# Patient Record
Sex: Male | Born: 1970 | Race: White | Hispanic: No | Marital: Married | Smoking: Former smoker
Health system: Southern US, Community
[De-identification: ages and names within clinical notes are randomized; demographics above are authoritative.]

## PROBLEM LIST (undated history)

## (undated) DIAGNOSIS — G4733 Obstructive sleep apnea (adult) (pediatric): Secondary | ICD-10-CM

## (undated) DIAGNOSIS — G4730 Sleep apnea, unspecified: Secondary | ICD-10-CM

## (undated) DIAGNOSIS — D696 Thrombocytopenia, unspecified: Secondary | ICD-10-CM

## (undated) DIAGNOSIS — M1990 Unspecified osteoarthritis, unspecified site: Secondary | ICD-10-CM

## (undated) DIAGNOSIS — G40909 Epilepsy, unspecified, not intractable, without status epilepticus: Secondary | ICD-10-CM

## (undated) DIAGNOSIS — S069XAA Unspecified intracranial injury with loss of consciousness status unknown, initial encounter: Secondary | ICD-10-CM

## (undated) DIAGNOSIS — S069X9A Unspecified intracranial injury with loss of consciousness of unspecified duration, initial encounter: Secondary | ICD-10-CM

## (undated) DIAGNOSIS — T7840XA Allergy, unspecified, initial encounter: Secondary | ICD-10-CM

## (undated) HISTORY — PX: CHOLECYSTECTOMY: SHX55

## (undated) HISTORY — PX: KNEE SURGERY: SHX244

## (undated) HISTORY — DX: Allergy, unspecified, initial encounter: T78.40XA

## (undated) HISTORY — PX: OTHER SURGICAL HISTORY: SHX169

## (undated) HISTORY — PX: SPINE SURGERY: SHX786

## (undated) HISTORY — PX: BACK SURGERY: SHX140

## (undated) HISTORY — PX: FRACTURE SURGERY: SHX138

## (undated) HISTORY — DX: Unspecified osteoarthritis, unspecified site: M19.90

## (undated) HISTORY — PX: BRAIN SURGERY: SHX531

## (undated) HISTORY — DX: Sleep apnea, unspecified: G47.30

---

## 2010-10-29 DIAGNOSIS — R7989 Other specified abnormal findings of blood chemistry: Secondary | ICD-10-CM | POA: Insufficient documentation

## 2010-10-29 DIAGNOSIS — E039 Hypothyroidism, unspecified: Secondary | ICD-10-CM

## 2010-10-29 HISTORY — DX: Hypothyroidism, unspecified: E03.9

## 2019-11-18 ENCOUNTER — Emergency Department
Admission: EM | Admit: 2019-11-18 | Discharge: 2019-11-18 | Disposition: A | Discharge status: Home / Self Care | Payer: Medicaid Other | Attending: Emergency Medicine | Admitting: Emergency Medicine

## 2019-11-18 ENCOUNTER — Other Ambulatory Visit: Payer: Self-pay

## 2019-11-18 ENCOUNTER — Emergency Department: Payer: Medicaid Other

## 2019-11-18 DIAGNOSIS — R569 Unspecified convulsions: Secondary | ICD-10-CM | POA: Diagnosis present

## 2019-11-18 HISTORY — DX: Epilepsy, unspecified, not intractable, without status epilepticus: G40.909

## 2019-11-18 LAB — CBC WITH DIFFERENTIAL/PLATELET
Abs Immature Granulocytes: 0.03 10*3/uL (ref 0.00–0.07)
Basophils Absolute: 0 10*3/uL (ref 0.0–0.1)
Basophils Relative: 0 %
Eosinophils Absolute: 0.2 10*3/uL (ref 0.0–0.5)
Eosinophils Relative: 2 %
HCT: 41 % (ref 39.0–52.0)
Hemoglobin: 13.8 g/dL (ref 13.0–17.0)
Immature Granulocytes: 0 %
Lymphocytes Relative: 29 %
Lymphs Abs: 2 10*3/uL (ref 0.7–4.0)
MCH: 29.4 pg (ref 26.0–34.0)
MCHC: 33.7 g/dL (ref 30.0–36.0)
MCV: 87.4 fL (ref 80.0–100.0)
Monocytes Absolute: 0.6 10*3/uL (ref 0.1–1.0)
Monocytes Relative: 8 %
Neutro Abs: 4.3 10*3/uL (ref 1.7–7.7)
Neutrophils Relative %: 61 %
Platelets: 117 10*3/uL — ABNORMAL LOW (ref 150–400)
RBC: 4.69 MIL/uL (ref 4.22–5.81)
RDW: 14 % (ref 11.5–15.5)
WBC: 7.1 10*3/uL (ref 4.0–10.5)
nRBC: 0 % (ref 0.0–0.2)

## 2019-11-18 LAB — BASIC METABOLIC PANEL
Anion gap: 9 (ref 5–15)
BUN: 14 mg/dL (ref 6–20)
CO2: 21 mmol/L — ABNORMAL LOW (ref 22–32)
Calcium: 8.4 mg/dL — ABNORMAL LOW (ref 8.9–10.3)
Chloride: 106 mmol/L (ref 98–111)
Creatinine, Ser: 0.92 mg/dL (ref 0.61–1.24)
GFR, Estimated: >60 mL/min (ref >60)
Glucose, Bld: 180 mg/dL — ABNORMAL HIGH (ref 70–99)
Potassium: 3.6 mmol/L (ref 3.5–5.1)
Sodium: 136 mmol/L (ref 135–145)

## 2019-11-18 LAB — ETHANOL: Alcohol, Ethyl (B): <10 mg/dL (ref <10)

## 2019-11-18 NOTE — Discharge Instructions (Addendum)
Please do not drive, go up on roofs/ladders, go in pools or perform any activity that could be dangerous to yourself or others until cleared by neurology. Please seek medical attention for any high fevers, chest pain, shortness of breath, change in behavior, persistent vomiting, bloody stool or any other new or concerning symptoms.

## 2019-11-18 NOTE — ED Notes (Addendum)
Patient transported to X-ray 

## 2019-11-18 NOTE — ED Notes (Signed)
Pt woke from sleep and stating "I need to get up my ass and back are hurting. I can't sit right here anymore." Penni Bombard RN explained for safety reasons we cannot assist patient in standing due to him having seizures earlier today. RN offered to ask doc for pain meds due to pt being uncomfortable in bed. Pt states "Well here's the deal, you've got about 5 minutes and then I AM getting out of this bed". MD Derrill Kay to bedside. Explains to pt again safety concerns, but pt again states he is ready to get up. Pt A&Ox4.

## 2019-11-18 NOTE — ED Triage Notes (Signed)
Pt coming from home via [facility] with seizure. Family states pt has seizure disorder from multiple concussions in the past and pt has had three seizures today at home. Family states pt recently has had a change in dosage in topramax. Pt had fourth seizure once en route with EMS. 5 of midalozam administered IV. Pt also complaining of right shoulder pain after pt was stuck between toilet and wall following third seizure. Pt lethargic and postictal at this time.

## 2019-11-18 NOTE — ED Provider Notes (Signed)
[facility] Emergency Department Provider Note  ____________________________________________   I have reviewed the triage vital signs and the nursing notes.   HISTORY  Chief Complaint Seizures   History limited by: Not Limited   HPI Randy Cordova. is a 49 y.o. male who presents to the emergency department today after a seizure.  The patient states that he has a history of seizures.  He had been getting his care down at [facility] in [place] but states he is recently moved to this area.  He is arranging to get care with Dr. Margaretmary Eddy group and has an appointment with him in the upcoming weeks.  Today however the patient states that he was getting overly stressed secondary to family issues with his mother.  He says he could feel his seizure coming on.  He then had a seizure in the morning.  Later in the day he had another seizure.  Apparently he was somewhat pinned in the bathroom and he is complaining of some residual right-sided pain.  Patient denies any recent illness.  Denies any recent alcohol use.  States that he does not sleep very well at night.   Records reviewed. Per medical record review patient has a history of epilepsy  Past Medical History:  Diagnosis Date  . Epilepsy (HCC)     There are no problems to display for this patient.   Prior to Admission medications   Not on File    Allergies Patient has no known allergies.  No family history on file.  Social History Social History   Tobacco Use  . Smoking status: Never Smoker  . Smokeless tobacco: Never Used  Vaping Use  . Vaping Use: Every day  Substance Use Topics  . Alcohol use: Yes    Comment: rarely  . Drug use: Never    Review of Systems Constitutional: No fever/chills Eyes: No visual changes. ENT: No sore throat. Cardiovascular: Denies chest pain. Respiratory: Denies shortness of breath. Gastrointestinal: No abdominal pain.  No nausea, no vomiting.  No diarrhea.    Genitourinary: Negative for dysuria. Musculoskeletal: Positive for right shoulder pain.  Skin: Negative for rash. Neurological: Positive for seizure.  ____________________________________________   PHYSICAL EXAM:  VITAL SIGNS: ED Triage Vitals  Enc Vitals Group     BP 11/18/19 2115 (!) 144/81     Pulse Rate 11/18/19 2115 96     Resp 11/18/19 2115 (!) 21     Temp 11/18/19 2115 97.8 F (36.6 C)     Temp Source 11/18/19 2115 Oral     SpO2 11/18/19 2115 97 %     Weight 11/18/19 2118 (!) 410 lb (186 kg)     Height 11/18/19 2118 5\' 11"  (1.803 m)     Head Circumference --      Peak Flow --      Pain Score 11/18/19 2118 7   Constitutional: Alert and oriented.  Eyes: Conjunctivae are normal.  ENT      Head: Normocephalic and atraumatic.      Nose: No congestion/rhinnorhea.      Mouth/Throat: Mucous membranes are moist.      Neck: No stridor. Hematological/Lymphatic/Immunilogical: No cervical lymphadenopathy. Cardiovascular: Normal rate, regular rhythm.  No murmurs, rubs, or gallops.  Respiratory: Normal respiratory effort without tachypnea nor retractions. Breath sounds are clear and equal bilaterally. No wheezes/rales/rhonchi. Gastrointestinal: Soft and non tender. No rebound. No guarding.  Genitourinary: Deferred Musculoskeletal: Normal range of motion in all extremities. No lower extremity edema. Neurologic:  Normal speech and language. No gross focal neurologic deficits are appreciated.  Skin:  Skin is warm, dry and intact. No rash noted. Psychiatric: Mood and affect are normal. Speech and behavior are normal. Patient exhibits appropriate insight and judgment.  ____________________________________________    LABS (pertinent positives/negatives)  BMP na 136, k 3.6, glu 180, cr 0.92 CBC wbc 7.1, hgb 13.8, plt 117 Ethanol below threshold ____________________________________________   EKG  I, Phineas Semen, attending physician, personally viewed and interpreted this  EKG  EKG Time: 2107 Rate: 95 Rhythm: normal sinus rhythm Axis: normal Intervals: qtc 454 QRS: narrow ST changes: no st elevation Impression: normal ekg  ____________________________________________    RADIOLOGY  Right shoulder No acute fracture or dislocation  ____________________________________________   PROCEDURES  Procedures  ____________________________________________   INITIAL IMPRESSION / ASSESSMENT AND PLAN / ED COURSE  Pertinent labs & imaging results that were available during my care of the patient were reviewed by me and considered in my medical decision making (see chart for details).   Patient presented to the emergency department today after seizures.  Patient has a known history of seizure disorder.  Recently moved to the area.  Patient states he has been taking his medications as prescribed.  States he was under stress today no family issues.  Work-up here without any concerning electrolyte abnormalities.  No leukocytosis.  Did obtain a right shoulder x-ray given her complaints of pain which did not show fracture or dislocation.  Patient states he is compliant with his medication.  Will observe in the emergency department for complete return to baseline.  If no further seizure activity I do think it be reasonable for patient to be discharged to follow-up with Dr. Sherryll Burger as planned.  ____________________________________________   FINAL CLINICAL IMPRESSION(S) / ED DIAGNOSES  Final diagnoses:  Seizure [facility])     Note: This dictation was prepared with Dragon dictation. Any transcriptional errors that result from this process are unintentional     Phineas Semen, MD 11/18/19 2223

## 2019-11-18 NOTE — ED Notes (Signed)
Pt returned from xray

## 2020-03-19 ENCOUNTER — Other Ambulatory Visit: Payer: Self-pay

## 2020-03-19 ENCOUNTER — Emergency Department: Payer: Medicaid Other

## 2020-03-19 ENCOUNTER — Observation Stay
Admission: EM | Admit: 2020-03-19 | Discharge: 2020-03-20 | Disposition: A | Discharge status: Home / Self Care | Payer: Medicaid Other | Attending: Internal Medicine | Admitting: Internal Medicine

## 2020-03-19 DIAGNOSIS — S80212A Abrasion, left knee, initial encounter: Secondary | ICD-10-CM | POA: Diagnosis not present

## 2020-03-19 DIAGNOSIS — D696 Thrombocytopenia, unspecified: Secondary | ICD-10-CM | POA: Diagnosis not present

## 2020-03-19 DIAGNOSIS — G4733 Obstructive sleep apnea (adult) (pediatric): Secondary | ICD-10-CM | POA: Diagnosis not present

## 2020-03-19 DIAGNOSIS — S8992XA Unspecified injury of left lower leg, initial encounter: Secondary | ICD-10-CM | POA: Diagnosis present

## 2020-03-19 DIAGNOSIS — X58XXXA Exposure to other specified factors, initial encounter: Secondary | ICD-10-CM | POA: Diagnosis not present

## 2020-03-19 DIAGNOSIS — G40909 Epilepsy, unspecified, not intractable, without status epilepticus: Secondary | ICD-10-CM

## 2020-03-19 DIAGNOSIS — M25562 Pain in left knee: Secondary | ICD-10-CM

## 2020-03-19 DIAGNOSIS — Z20822 Contact with and (suspected) exposure to covid-19: Secondary | ICD-10-CM | POA: Diagnosis not present

## 2020-03-19 DIAGNOSIS — G40919 Epilepsy, unspecified, intractable, without status epilepticus: Secondary | ICD-10-CM

## 2020-03-19 DIAGNOSIS — R569 Unspecified convulsions: Secondary | ICD-10-CM | POA: Diagnosis not present

## 2020-03-19 DIAGNOSIS — Z9989 Dependence on other enabling machines and devices: Secondary | ICD-10-CM

## 2020-03-19 HISTORY — DX: Obstructive sleep apnea (adult) (pediatric): G47.33

## 2020-03-19 HISTORY — DX: Thrombocytopenia, unspecified: D69.6

## 2020-03-19 HISTORY — DX: Unspecified intracranial injury with loss of consciousness status unknown, initial encounter: S06.9XAA

## 2020-03-19 HISTORY — DX: Unspecified intracranial injury with loss of consciousness of unspecified duration, initial encounter: S06.9X9A

## 2020-03-19 LAB — URINALYSIS, COMPLETE (UACMP) WITH MICROSCOPIC
Bilirubin Urine: NEGATIVE
Glucose, UA: NEGATIVE mg/dL
Hgb urine dipstick: NEGATIVE
Ketones, ur: NEGATIVE mg/dL
Leukocytes,Ua: NEGATIVE
Nitrite: NEGATIVE
Protein, ur: NEGATIVE mg/dL
Specific Gravity, Urine: 1.024 (ref 1.005–1.030)
Squamous Epithelial / HPF: NONE SEEN (ref 0–5)
pH: 5 (ref 5.0–8.0)

## 2020-03-19 LAB — CBC
HCT: 43.4 % (ref 39.0–52.0)
Hemoglobin: 14.3 g/dL (ref 13.0–17.0)
MCH: 29.4 pg (ref 26.0–34.0)
MCHC: 32.9 g/dL (ref 30.0–36.0)
MCV: 89.1 fL (ref 80.0–100.0)
Platelets: 125 10*3/uL — ABNORMAL LOW (ref 150–400)
RBC: 4.87 MIL/uL (ref 4.22–5.81)
RDW: 14.3 % (ref 11.5–15.5)
WBC: 8.3 10*3/uL (ref 4.0–10.5)
nRBC: 0 % (ref 0.0–0.2)

## 2020-03-19 LAB — URINE DRUG SCREEN, QUALITATIVE (ARMC ONLY)
Amphetamines, Ur Screen: NOT DETECTED
Barbiturates, Ur Screen: NOT DETECTED
Benzodiazepine, Ur Scrn: POSITIVE — AB
Cannabinoid 50 Ng, Ur ~~LOC~~: NOT DETECTED
Cocaine Metabolite,Ur ~~LOC~~: NOT DETECTED
MDMA (Ecstasy)Ur Screen: NOT DETECTED
Methadone Scn, Ur: NOT DETECTED
Opiate, Ur Screen: POSITIVE — AB
Phencyclidine (PCP) Ur S: NOT DETECTED
Tricyclic, Ur Screen: NOT DETECTED

## 2020-03-19 LAB — RESP PANEL BY RT-PCR (FLU A&B, COVID) ARPGX2
Influenza A by PCR: NEGATIVE
Influenza B by PCR: NEGATIVE
SARS Coronavirus 2 by RT PCR: NEGATIVE

## 2020-03-19 LAB — BASIC METABOLIC PANEL
Anion gap: 13 (ref 5–15)
BUN: 15 mg/dL (ref 6–20)
CO2: 16 mmol/L — ABNORMAL LOW (ref 22–32)
Calcium: 8.7 mg/dL — ABNORMAL LOW (ref 8.9–10.3)
Chloride: 109 mmol/L (ref 98–111)
Creatinine, Ser: 1.08 mg/dL (ref 0.61–1.24)
GFR, Estimated: >60 mL/min (ref >60)
Glucose, Bld: 184 mg/dL — ABNORMAL HIGH (ref 70–99)
Potassium: 3.7 mmol/L (ref 3.5–5.1)
Sodium: 138 mmol/L (ref 135–145)

## 2020-03-19 LAB — LACTIC ACID, PLASMA: Lactic Acid, Venous: 1.4 mmol/L (ref 0.5–1.9)

## 2020-03-19 LAB — CK: Total CK: 65 U/L (ref 49–397)

## 2020-03-19 LAB — CBG MONITORING, ED: Glucose-Capillary: 145 mg/dL — ABNORMAL HIGH (ref 70–99)

## 2020-03-19 MED ORDER — TOPIRAMATE 25 MG PO TABS
200.0000 mg | ORAL_TABLET | ORAL | Status: AC
  Administered 2020-03-19: 200 mg via ORAL
  Filled 2020-03-19: qty 8

## 2020-03-19 MED ORDER — ONDANSETRON HCL 4 MG/2ML IJ SOLN: 4.0000 mg | Freq: Three times a day (TID) | INTRAMUSCULAR | Status: DC | PRN

## 2020-03-19 MED ORDER — LEVETIRACETAM IN NACL 1500 MG/100ML IV SOLN
1500.0000 mg | Freq: Two times a day (BID) | INTRAVENOUS | Status: DC
  Administered 2020-03-19: 1500 mg via INTRAVENOUS
  Filled 2020-03-19 (×3): qty 100

## 2020-03-19 MED ORDER — CLOBAZAM 10 MG PO TABS
5.0000 mg | ORAL_TABLET | Freq: Every day | ORAL | Status: DC
Start: 2020-03-20 — End: 2020-03-20

## 2020-03-19 MED ORDER — OXCARBAZEPINE 300 MG PO TABS
900.0000 mg | ORAL_TABLET | ORAL | Status: AC
  Administered 2020-03-19: 900 mg via ORAL
  Filled 2020-03-19: qty 3

## 2020-03-19 MED ORDER — ENOXAPARIN SODIUM 100 MG/ML ~~LOC~~ SOLN
0.5000 mg/kg | SUBCUTANEOUS | Status: DC
  Administered 2020-03-19: 92.5 mg via SUBCUTANEOUS
  Filled 2020-03-19 (×2): qty 1

## 2020-03-19 MED ORDER — ENOXAPARIN SODIUM 40 MG/0.4ML ~~LOC~~ SOLN: 40.0000 mg | SUBCUTANEOUS | Status: DC

## 2020-03-19 MED ORDER — LORAZEPAM 2 MG/ML IJ SOLN: 2.0000 mg | INTRAMUSCULAR | Status: DC | PRN

## 2020-03-19 MED ORDER — OXYCODONE-ACETAMINOPHEN 5-325 MG PO TABS
2.0000 | ORAL_TABLET | ORAL | Status: AC
  Administered 2020-03-19: 2 via ORAL
  Filled 2020-03-19: qty 2

## 2020-03-19 MED ORDER — MORPHINE SULFATE (PF) 4 MG/ML IV SOLN
4.0000 mg | Freq: Once | INTRAVENOUS | Status: AC
  Administered 2020-03-19: 4 mg via INTRAVENOUS
  Filled 2020-03-19: qty 1

## 2020-03-19 MED ORDER — ACETAMINOPHEN 325 MG PO TABS
650.0000 mg | ORAL_TABLET | Freq: Four times a day (QID) | ORAL | Status: DC | PRN
  Administered 2020-03-20: 10:00:00 650 mg via ORAL
  Filled 2020-03-19: qty 2

## 2020-03-19 MED ORDER — CLOBAZAM 10 MG PO TABS
5.0000 mg | ORAL_TABLET | Freq: Once | ORAL | Status: AC
  Administered 2020-03-19: 5 mg via ORAL
  Filled 2020-03-19: qty 1

## 2020-03-19 MED ORDER — LEVETIRACETAM IN NACL 1500 MG/100ML IV SOLN
1500.0000 mg | Freq: Once | INTRAVENOUS | Status: AC
  Administered 2020-03-19: 1500 mg via INTRAVENOUS
  Filled 2020-03-19: qty 100

## 2020-03-19 MED ORDER — OXCARBAZEPINE 300 MG PO TABS
1200.0000 mg | ORAL_TABLET | Freq: Two times a day (BID) | ORAL | Status: DC
  Administered 2020-03-19 – 2020-03-20 (×2): 1200 mg via ORAL
  Filled 2020-03-19 (×3): qty 4

## 2020-03-19 MED ORDER — SODIUM CHLORIDE 0.9 % IV BOLUS
1000.0000 mL | Freq: Once | INTRAVENOUS | Status: AC
  Administered 2020-03-19: 1000 mL via INTRAVENOUS

## 2020-03-19 MED ORDER — OXCARBAZEPINE 300 MG PO TABS
300.0000 mg | ORAL_TABLET | Freq: Once | ORAL | Status: AC
  Administered 2020-03-19: 300 mg via ORAL
  Filled 2020-03-19: qty 1

## 2020-03-19 MED ORDER — TOPIRAMATE 100 MG PO TABS
200.0000 mg | ORAL_TABLET | Freq: Two times a day (BID) | ORAL | Status: DC
  Administered 2020-03-19 – 2020-03-20 (×2): 200 mg via ORAL
  Filled 2020-03-19: qty 8
  Filled 2020-03-19 (×2): qty 2

## 2020-03-19 MED ORDER — KETOROLAC TROMETHAMINE 30 MG/ML IJ SOLN
30.0000 mg | Freq: Once | INTRAMUSCULAR | Status: AC
  Administered 2020-03-19: 30 mg via INTRAVENOUS
  Filled 2020-03-19: qty 1

## 2020-03-19 NOTE — ED Provider Notes (Signed)
[facility] Emergency Department Provider Note ____________________________________________   Event Date/Time   First MD Initiated Contact with Patient 03/19/20 1022     (approximate)  I have reviewed the triage vital signs and the nursing notes.  HISTORY  Chief Complaint Seizures  HPI Randy Cordova. is a 50 y.o. male history of epilepsy  Patient reports that he usually has about 1 seizure per week.  He sees Dr. Malvin Johns of neurology.  Today he is not sure how many seizures he had, but he knows that EMS came to get him.  EMS was able to speak to the patient's roommate who witnessed him have a seizure and that he fell out of bed, shortly thereafter he had another seizure each with which lasted about 30 seconds.   While with EMS he had additional seizure lasting about a minute or less, he received 2 mg of Versed with improvement.  He is now alert and oriented.  He is complaining to EMS noting that his left leg hurts.  Patient reports that he has a long history of seizures he is compliant with his medication but not yet taken his medicines today.  Sees Dr. Malvin Johns.  Right now the only thing hurting right now is his left knee that feels banged up.  No numbness tingling or weakness.  No headache.  No fevers or chills.  Denies recent illness other than having a seizure about 1 week ago which is not unusual  He has a previous history of traumatic brain injury   Past Medical History:  Diagnosis Date  . Epilepsy (HCC)     There are no problems to display for this patient.   History reviewed. No pertinent surgical history.  Prior to Admission medications   Not on File    Allergies Patient has no known allergies.  History reviewed. No pertinent family history.  Social History Social History   Tobacco Use  . Smoking status: Never Smoker  . Smokeless tobacco: Never Used  Vaping Use  . Vaping Use: Every day  Substance Use Topics  . Alcohol use: Yes     Comment: rarely  . Drug use: Never    Review of Systems Constitutional: No fever/chills Eyes: No visual changes. ENT: No sore throat.  Denies neck pain.  Reports a consistent soreness over the back of his head that requires that he have a pillow under his head at all times which he reports is chronic. Cardiovascular: Denies chest pain. Respiratory: Denies shortness of breath. Gastrointestinal: No abdominal pain.   Genitourinary: Negative for dysuria. Musculoskeletal: Negative for back pain.  Pain over the left knee Skin: Negative for rash. Neurological: Negative for headaches, areas of focal weakness or numbness.    ____________________________________________   PHYSICAL EXAM:  VITAL SIGNS: ED Triage Vitals  Enc Vitals Group     BP 03/19/20 1030 138/72     Pulse Rate 03/19/20 1030 (!) 103     Resp 03/19/20 1030 (!) 22     Temp 03/19/20 1030 97.9 F (36.6 C)     Temp Source 03/19/20 1030 Oral     SpO2 03/19/20 1030 95 %     Weight 03/19/20 1024 (!) 410 lb (186 kg)     Height 03/19/20 1024 6' (1.829 m)     Head Circumference --      Peak Flow --      Pain Score 03/19/20 1025 4     Pain Loc --      Pain  Edu? --      Excl. in GC? --     Constitutional: Alert and oriented. Well appearing and in no acute distress.  Patient does have morbid obesity.  He is very pleasant.  Does tell me that he has a history of having a very short temper but he is calm and pleasant Eyes: Conjunctivae are normal. Head: Atraumatic. Nose: No congestion/rhinnorhea. Mouth/Throat: Mucous membranes are moist. Neck: No stridor.  Cardiovascular: Normal rate, regular rhythm. Grossly normal heart sounds.  Good peripheral circulation. Respiratory: Normal respiratory effort.  No retractions. Lungs CTAB. Gastrointestinal: Soft and nontender. No distention. Musculoskeletal: Good use of all extremities except he does have some slight limitation of range of motion of the left knee reports pain through the  range of motion.  There is a small abrasion and slight contusion overlying the left anterior knee joint.  Neurovascularly intact distally with strong distal perfusion. Neurologic:  Normal speech and language. No gross focal neurologic deficits are appreciated.  He exhibits no seizure-like activity at this time.  Equal grip strengths.  Normal facial movements.  Clear speech.  No twitching or tremoring.  Fully alert Skin:  Skin is warm, dry and intact. No rash noted. Psychiatric: Mood and affect are normal. Speech and behavior are normal.  ____________________________________________   LABS (all labs ordered are listed, but only abnormal results are displayed)  Labs Reviewed  RESP PANEL BY RT-PCR (FLU A&B, COVID) [id number]  CBC  BASIC METABOLIC PANEL  LEVETIRACETAM LEVEL  URINALYSIS, COMPLETE (UACMP) WITH MICROSCOPIC  CBG MONITORING, ED   ____________________________________________  EKG  Reviewed inter by me at 1502 Heart rate 95 QRS 100 QTc 450 Normal sinus rhythm no evidence of ischemia ____________________________________________  RADIOLOGY  DG Tibia/Fibula Left  Result Date: 03/19/2020 CLINICAL DATA:  Fall, lower leg pain laterally EXAM: LEFT TIBIA AND FIBULA - 2 VIEW COMPARISON:  Knee radiographs from 03/19/2020 FINDINGS: Considerable tricompartmental osteoarthritis in the knee. No fracture or acute bony findings identified. No foreign body is observed. IMPRESSION: 1. Considerable tricompartmental osteoarthritis in the knee. No tibia/fibular fracture is identified. Electronically Signed   By: Gaylyn RongWalter  Liebkemann M.D.   On: 03/19/2020 13:00   CT Head Wo Contrast  Result Date: 03/19/2020 CLINICAL DATA:  Seizure with fall EXAM: CT HEAD WITHOUT CONTRAST CT CERVICAL SPINE WITHOUT CONTRAST TECHNIQUE: Multidetector CT imaging of the head and cervical spine was performed following the standard protocol without intravenous contrast. Multiplanar CT image reconstructions of the cervical  spine were also generated. COMPARISON:  None. FINDINGS: CT HEAD FINDINGS Brain: Ventricles and sulci are normal in size and configuration. There is no intracranial mass, hemorrhage, extra-axial fluid collection, or midline shift. Brain parenchyma appears unremarkable. No evident acute infarct. Vascular: No hyperdense vessels. No appreciable vascular calcification. Skull: Bony calvarium appears intact. Sinuses/Orbits: There is an apparent retention cyst in the left sphenoid sinus. There is mild mucosal thickening in several ethmoid air cells. Orbits appear symmetric bilaterally. Other: Mastoid air cells are clear. CT CERVICAL SPINE FINDINGS Alignment: There is no appreciable spondylolisthesis. Skull base and vertebrae: The skull base and craniocervical junction regions appear normal. There is no fracture. There are no blastic or lytic bone lesions. Soft tissues and spinal canal: Prevertebral soft tissues and predental space regions are normal. There is no cord or canal hematoma. There are no paraspinous lesions. Disc levels: There is moderate disc space narrowing C6-7 and C7-T1 with milder disc space narrowing at C5-6. 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 Anterior osteophytes are noted at C4, C5, C6, and C7. There is facet hypertrophy at several levels without nerve root edema or effacement. No frank disc extrusion. No stenosis at levels other than the upper cervical region due to the posterior longitudinal ligament ossification. Upper chest: Visualized upper abdominal structures appear normal. Other: None IMPRESSION: CT head: Normal appearing brain parenchyma. No mass or hemorrhage. Areas of paranasal sinus disease noted. CT cervical spine: No fracture or spondylolisthesis. 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 Osteoarthritic change at several other levels without spinal stenosis. No disc extrusion noted. Electronically Signed   By: Bretta Bang III M.D.   On: 03/19/2020 12:17   CT Cervical Spine Wo Contrast  Result Date: 03/19/2020 CLINICAL DATA:  Seizure with fall EXAM: CT HEAD WITHOUT CONTRAST CT CERVICAL SPINE WITHOUT CONTRAST TECHNIQUE: Multidetector CT imaging of the head and cervical spine was performed following the standard protocol without intravenous contrast. Multiplanar CT image reconstructions of the cervical spine were also generated. COMPARISON:  None. FINDINGS: CT HEAD FINDINGS Brain: Ventricles and sulci are normal in size and configuration. There is no intracranial mass, hemorrhage, extra-axial fluid collection, or midline shift. Brain parenchyma appears unremarkable. No evident acute infarct. Vascular: No hyperdense vessels. No appreciable vascular calcification. Skull: Bony calvarium appears intact. Sinuses/Orbits: There is an apparent retention cyst in the left sphenoid sinus. There is mild mucosal thickening in several ethmoid air cells. Orbits appear symmetric bilaterally. Other: Mastoid air cells are clear. CT CERVICAL SPINE FINDINGS Alignment: There is no appreciable spondylolisthesis. Skull base and vertebrae: The skull base and craniocervical junction regions appear normal. There is no fracture. There are no blastic or lytic bone lesions. Soft tissues and spinal canal: Prevertebral soft tissues and predental space regions are normal. There is no cord or canal hematoma. There are no paraspinous lesions. Disc levels: There is moderate disc space narrowing C6-7 and C7-T1 with milder disc space narrowing at C5-6. 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 Anterior osteophytes are noted at C4, C5, C6, and C7. There is facet hypertrophy at several levels without nerve root edema or effacement. No frank disc extrusion. No stenosis at levels other than the upper cervical region due to the posterior longitudinal ligament ossification. Upper chest: Visualized upper abdominal structures appear normal. Other: None IMPRESSION: CT head: Normal appearing brain parenchyma. No mass or hemorrhage. Areas of paranasal sinus disease noted. CT cervical spine: No fracture or spondylolisthesis. 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 Osteoarthritic change at several other levels without spinal stenosis. No disc extrusion noted. Electronically Signed   By: Bretta Bang III M.D.   On: 03/19/2020 12:17   DG Knee Complete 4 Views Left  Result Date: 03/19/2020 CLINICAL DATA:  Left knee pain after seizure with fall. EXAM: LEFT KNEE - COMPLETE 4+ VIEW COMPARISON:  None. FINDINGS: No acute fracture or dislocation. No definite joint effusion, although evaluation is limited due to suboptimal positioning on the lateral view. Moderate medial and patellofemoral compartment joint space narrowing. Mild lateral compartment joint space narrowing. Tricompartmental marginal osteophytes. Bone mineralization is normal.  Soft tissues are unremarkable. IMPRESSION: 1. No acute osseous abnormality. 2. Tricompartmental osteoarthritis. Electronically Signed   By: Obie Dredge M.D.   On: 03/19/2020 11:53     ____________________________________________   PROCEDURES  Procedure(s) performed: None  Procedures  Critical Care performed: No  ____________________________________________   INITIAL IMPRESSION / ASSESSMENT AND PLAN / ED COURSE  Pertinent labs & imaging results that were available during my care of the patient were reviewed by me and considered in my medical  decision making (see chart for details).   Patient presents for apparent 3 seizures today.  Each of which was relatively brief, and these occur and what he reports is compliance with his medications.  I have ordered Keppra IV as well as his oxcarbazepine and Topamax.  I have discussed with neurologist, Dr. Iver Nestle will see him in consultation due to the complexity of his epilepsy history and taking multiple seizure medicines.  He is currently awake and alert without distress, he does complain of pain over the left knee and I have ordered imaging to exclude fracture I suspect soft tissue injury and abrasion possibly some element of contusion or small hematoma over the anterior left knee.  He is able to range it though but with pain, low suspicion for fracture.  We will obtain CT of the head as he has had multiple seizures today and did fall out of bed.  Also include cervical spine he reports pain over the back of the head but reports this to be chronic, but also wished to exclude cervical injury.  Neurologically intact.  We will continue to monitor carefully, seizure precautions.  Denies any acute infectious illness no dysuria, no cough no fevers no recent illness other than a seizure a week ago which he reports is not unusual.  Clinical Course as of 03/19/20 1448  Sun Mar 19, 2020  1032 Consult placed and acknowledged by Dr. Iver Nestle at this time.  [MQ]  1226 Patient resting comfortably, reports still having ongoing knee pain little bit lower than his knee joint at this time.  No obvious injury except some swelling and abrasion over the anterior knee.  No pain in his foot or ankle.  Will obtain imaging to evaluate for possible tib-fib injury though low suspicion.  Patient does report a history of chronic issues with his left knee as well.  Discussed with patient risk benefits of pain medications, will utilize Percocet for pain relief at this time.  He is comfortable alert and oriented no further seizure  activity here.  Understands awaiting neuro consult at this time.  Strong dorsalis pedis posterior tibial pulses left lower extremity.  Normal neuro exam able to dorsi and plantarflex left foot without difficulty [MQ]    Clinical Course User Index [MQ] Sharyn Creamer, MD   ----------------------------------------- 2:51 PM on 03/19/2020 -----------------------------------------  Appreciate consultation from neurology.  Patient will be admitted for observation due to multiple seizures today, is as recommended by neurology.  Admission discussed with Dr. Clyde Lundborg  ____________________________________________   FINAL CLINICAL IMPRESSION(S) / ED DIAGNOSES  Final diagnoses:  Nonintractable epilepsy without status epilepticus, unspecified epilepsy type (HCC)  Acute pain of left knee  Abrasion of left knee, initial encounter        Note:  This document was prepared using Dragon voice recognition software and may include unintentional dictation errors       Sharyn Creamer, MD 03/19/20 1515

## 2020-03-19 NOTE — ED Notes (Addendum)
Patient states that he has "cloudy feeling" that he sometimes gets prior to seizures. Patient just returned to bed from using restroom. Patient's HR and work of breathing both elevated after returning to bed. Patient alert and oriented, speaking in full, clear sentences at this time. Message sent to Clyde Lundborg, MD stating same.

## 2020-03-19 NOTE — ED Triage Notes (Signed)
Pt had one unwitnessed and 2 witnessed seizures this am, each lasting about 30-45 seconds. Pt alert and oriented on arrival. C/o left leg pain

## 2020-03-19 NOTE — ED Notes (Addendum)
Patient to CT at this time. Patient aware of need of urine sample. Urinal a bedside.

## 2020-03-19 NOTE — H&P (Signed)
History and Physical    Randy Cordova. HQR:[id number] DOB: 1970/09/25 DOA: 03/19/2020  Referring MD/NP/PA:   PCP: Pcp, No   Patient coming from:  The patient is coming from home.  At baseline, pt is independent for most of ADL.        Chief Complaint: seizure  HPI: Randy Cordova. is a 50 y.o. male with medical history significant of seizure, thrombocytopenia, traumatic brain injury, OSA on CPAP,who presents with seizure  Patient has history of seizure, has been followed up by neurologist, Dr. Malvin Johns.  He states that he usually has 1 seizure per week. Per EMS report, patient's roommate witnessed patient have a seizure, he fell out of bed. Shortly thereafter he had another seizure. Both episodes lasted for about 30 seconds. While with EMS, pt had additional seizure lasting about a minute or less. He was given 2 mg of Versed with improvement. At arrival to ED, pt is alert and oriented.  He is complaining left leg and knee pain. He has small area of skin abrasion the left knee. No numbness, tingling or weakness.  No headache.  Patient does not have chest pain, shortness breath, cough, fever or chills.  No nausea vomiting, diarrhea, abdominal pain, symptoms of UTI. Pt states that he has long history of seizure and has been compliant with his medication, but not yet taken his medicines today.    ED Course: pt was found to have WBC 8.3, negative Covid PCR, CK level 65, platelet 125 (117 on 12/19/2019), renal function okay, temperature normal, blood pressure 141/75, heart rate 103, 98, RR 22, oxygen saturation 95% on room air.  CT of head is negative for acute intracranial abnormalities.  CT of C-spine is negative for bony fracture, but showed some stenosis.  X-ray of left knee and left tibia/fibula negative for bony fracture.  Patient is placed on MedSurg bed for observation, Dr. Iver Nestle of neurology is consulted  Review of Systems:   General: no fevers, chills, no body weight gain, has  fatigue HEENT: no blurry vision, hearing changes or sore throat Respiratory: no dyspnea, coughing, wheezing CV: no chest pain, no palpitations GI: no nausea, vomiting, abdominal pain, diarrhea, constipation GU: no dysuria, burning on urination, increased urinary frequency, hematuria  Ext: no leg edema Neuro: no unilateral weakness, numbness, or tingling, no vision change or hearing loss. Has seizure Skin: no rash. small area of skin abrasion to left knee MSK: No muscle spasm, no deformity, no limitation of range of movement in spin. Has left leg and knee pain Heme: No easy bruising.  Travel history: No recent long distant travel.  Allergy:  Allergies  Allergen Reactions  . Other Other (See Comments)    Uncoded Allergy. Allergen: ARTIFICIAL SWEETNERS  . Aspartame And Phenylalanine     Headache    Past Medical History:  Diagnosis Date  . Epilepsy (HCC)   . OSA on CPAP   . TBI (traumatic brain injury) (HCC)   . Thrombocytopenia (HCC)     Past Surgical History:  Procedure Laterality Date  . BACK SURGERY    . CHOLECYSTECTOMY    . Head surgery     Due to brain injury  . KNEE SURGERY      Social History:  reports that he has never smoked. He has never used smokeless tobacco. He reports current alcohol use. He reports that he does not use drugs.  Family History:  Family History  Problem Relation Age of Onset  . Arthritis/Rheumatoid Mother  Prior to Admission medications   Not on File    Physical Exam: Vitals:   03/19/20 1230 03/19/20 1300 03/19/20 1503 03/19/20 1510  BP: (!) 142/79 (!) 141/75 (!) 112/54   Pulse: 99 98 95   Resp: 18  16   Temp:      TempSrc:      SpO2: 98% 98% 91% 98%  Weight:      Height:       General: Not in acute distress HEENT:       Eyes: PERRL, EOMI, no scleral icterus.       ENT: No discharge from the ears and nose, no pharynx injection, no tonsillar enlargement.        Neck: No JVD, no bruit, no mass felt. Heme: No neck lymph  node enlargement. Cardiac: S1/S2, RRR, No murmurs, No gallops or rubs. Respiratory: No rales, wheezing, rhonchi or rubs. GI: Soft, nondistended, nontender, no rebound pain, no organomegaly, BS present. GU: No hematuria Ext: No pitting leg edema bilaterally. 1+DP/PT pulse bilaterally. Musculoskeletal: No joint deformities, No joint redness or warmth, no limitation of ROM in spin. Skin: No rashes. small area of skin abrasion to left knee Neuro: Alert, oriented X3, cranial nerves II-XII grossly intact, moves all extremities normally. Psych: Patient is not psychotic, no suicidal or hemocidal ideation.  Labs on Admission: I have personally reviewed following labs and imaging studies  CBC: Recent Labs  Lab 03/19/20 1026  WBC 8.3  HGB 14.3  HCT 43.4  MCV 89.1  PLT 125*   Basic Metabolic Panel: Recent Labs  Lab 03/19/20 1026  NA 138  K 3.7  CL 109  CO2 16*  GLUCOSE 184*  BUN 15  CREATININE 1.08  CALCIUM 8.7*   GFR: Estimated Creatinine Clearance: 141.6 mL/min (by C-G formula based on SCr of 1.08 mg/dL). Liver Function Tests: No results for input(s): AST, ALT, ALKPHOS, BILITOT, PROT, ALBUMIN in the last 168 hours. No results for input(s): LIPASE, AMYLASE in the last 168 hours. No results for input(s): AMMONIA in the last 168 hours. Coagulation Profile: No results for input(s): INR, PROTIME in the last 168 hours. Cardiac Enzymes: Recent Labs  Lab 03/19/20 1028  CKTOTAL 65   BNP (last 3 results) No results for input(s): PROBNP in the last 8760 hours. HbA1C: No results for input(s): HGBA1C in the last 72 hours. CBG: Recent Labs  Lab 03/19/20 1117  GLUCAP 145*   Lipid Profile: No results for input(s): CHOL, HDL, LDLCALC, TRIG, CHOLHDL, LDLDIRECT in the last 72 hours. Thyroid Function Tests: No results for input(s): TSH, T4TOTAL, FREET4, T3FREE, THYROIDAB in the last 72 hours. Anemia Panel: No results for input(s): VITAMINB12, FOLATE, FERRITIN, TIBC, IRON,  RETICCTPCT in the last 72 hours. Urine analysis: No results found for: COLORURINE, APPEARANCEUR, LABSPEC, PHURINE, GLUCOSEU, HGBUR, BILIRUBINUR, KETONESUR, PROTEINUR, UROBILINOGEN, NITRITE, LEUKOCYTESUR Sepsis Labs: @LABRCNTIP (procalcitonin:4,lacticidven:4) ) Recent Results (from the past 240 hour(s))  Resp Panel by RT-PCR (Flu A&B, Covid) Nasopharyngeal Swab     Status: None   Collection Time: 03/19/20 10:28 AM   Specimen: Nasopharyngeal Swab; Nasopharyngeal(NP) swabs in vial transport medium  Result Value Ref Range Status   SARS Coronavirus 2 by RT PCR NEGATIVE NEGATIVE Final    Comment: (NOTE) SARS-CoV-2 target nucleic acids are NOT DETECTED.  The SARS-CoV-2 RNA is generally detectable in upper respiratory specimens during the acute phase of infection. The lowest concentration of SARS-CoV-2 viral copies this assay can detect is 138 copies/mL. A negative result does not preclude SARS-Cov-2 infection and  should not be used as the sole basis for treatment or other patient management decisions. A negative result may occur with  improper specimen collection/handling, submission of specimen other than nasopharyngeal swab, presence of viral mutation(s) within the areas targeted by this assay, and inadequate number of viral copies(<138 copies/mL). A negative result must be combined with clinical observations, patient history, and epidemiological information. The expected result is Negative.  Fact Sheet for Patients:  BloggerCourse.com  Fact Sheet for Healthcare Providers:  SeriousBroker.it  This test is no t yet approved or cleared by the [place] FDA and  has been authorized for detection and/or diagnosis of SARS-CoV-2 by FDA under an Emergency Use Authorization (EUA). This EUA will remain  in effect (meaning this test can be used) for the duration of the COVID-19 declaration under Section 564(b)(1) of the Act, 21 U.S.C.section  360bbb-3(b)(1), unless the authorization is terminated  or revoked sooner.       Influenza A by PCR NEGATIVE NEGATIVE Final   Influenza B by PCR NEGATIVE NEGATIVE Final    Comment: (NOTE) The Xpert Xpress SARS-CoV-2/FLU/RSV plus assay is intended as an aid in the diagnosis of influenza from Nasopharyngeal swab specimens and should not be used as a sole basis for treatment. Nasal washings and aspirates are unacceptable for Xpert Xpress SARS-CoV-2/FLU/RSV testing.  Fact Sheet for Patients: BloggerCourse.com  Fact Sheet for Healthcare Providers: SeriousBroker.it  This test is not yet approved or cleared by the [place] FDA and has been authorized for detection and/or diagnosis of SARS-CoV-2 by FDA under an Emergency Use Authorization (EUA). This EUA will remain in effect (meaning this test can be used) for the duration of the COVID-19 declaration under Section 564(b)(1) of the Act, 21 U.S.C. section 360bbb-3(b)(1), unless the authorization is terminated or revoked.  Performed at [facility], [address] [place], [place] [address]      Radiological Exams on Admission: DG Tibia/Fibula Left  Result Date: 03/19/2020 CLINICAL DATA:  Fall, lower leg pain laterally EXAM: LEFT TIBIA AND FIBULA - 2 VIEW COMPARISON:  Knee radiographs from 03/19/2020 FINDINGS: Considerable tricompartmental osteoarthritis in the knee. No fracture or acute bony findings identified. No foreign body is observed. IMPRESSION: 1. Considerable tricompartmental osteoarthritis in the knee. No tibia/fibular fracture is identified. Electronically Signed   By: Gaylyn Rong M.D.   On: 03/19/2020 13:00   CT Head Wo Contrast  Result Date: 03/19/2020 CLINICAL DATA:  Seizure with fall EXAM: CT HEAD WITHOUT CONTRAST CT CERVICAL SPINE WITHOUT CONTRAST TECHNIQUE: Multidetector CT imaging of the head and cervical spine was performed following the standard  protocol without intravenous contrast. Multiplanar CT image reconstructions of the cervical spine were also generated. COMPARISON:  None. FINDINGS: CT HEAD FINDINGS Brain: Ventricles and sulci are normal in size and configuration. There is no intracranial mass, hemorrhage, extra-axial fluid collection, or midline shift. Brain parenchyma appears unremarkable. No evident acute infarct. Vascular: No hyperdense vessels. No appreciable vascular calcification. Skull: Bony calvarium appears intact. Sinuses/Orbits: There is an apparent retention cyst in the left sphenoid sinus. There is mild mucosal thickening in several ethmoid air cells. Orbits appear symmetric bilaterally. Other: Mastoid air cells are clear. CT CERVICAL SPINE FINDINGS Alignment: There is no appreciable spondylolisthesis. Skull base and vertebrae: The skull base and craniocervical junction regions appear normal. There is no fracture. There are no blastic or lytic bone lesions. Soft tissues and spinal canal: Prevertebral soft tissues and predental space regions are normal. There is no cord or canal hematoma. There  are no paraspinous lesions. Disc levels: There is moderate disc space narrowing C6-7 and C7-T1 with milder disc space narrowing at C5-6. 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 Anterior osteophytes are noted at C4, C5, C6, and C7. There is facet hypertrophy at several levels without nerve root edema or effacement. No frank disc extrusion. No stenosis at levels other than the upper cervical region due to the posterior longitudinal ligament ossification. Upper chest: Visualized upper abdominal structures appear normal. Other: None IMPRESSION: CT head: Normal appearing brain parenchyma. No mass or hemorrhage. Areas of paranasal sinus disease noted. CT  cervical spine: No fracture or spondylolisthesis. 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 Osteoarthritic change at several other levels without spinal stenosis. No disc extrusion noted. Electronically Signed   By: Bretta BangWilliam  Woodruff III M.D.   On: 03/19/2020 12:17   CT Cervical Spine Wo Contrast  Result Date: 03/19/2020 CLINICAL DATA:  Seizure with fall EXAM: CT HEAD WITHOUT CONTRAST CT CERVICAL SPINE WITHOUT CONTRAST TECHNIQUE: Multidetector CT imaging of the head and cervical spine was performed following the standard protocol without intravenous contrast. Multiplanar CT image reconstructions of the cervical spine were also generated. COMPARISON:  None. FINDINGS: CT HEAD FINDINGS Brain: Ventricles and sulci are normal in size and configuration. There is no intracranial mass, hemorrhage, extra-axial fluid collection, or midline shift. Brain parenchyma appears unremarkable. No evident acute infarct. Vascular: No hyperdense vessels. No appreciable vascular calcification. Skull: Bony calvarium appears intact. Sinuses/Orbits: There is an apparent retention cyst in the left sphenoid sinus. There is mild mucosal thickening in several ethmoid air cells. Orbits appear symmetric bilaterally. Other: Mastoid air cells are clear. CT CERVICAL SPINE FINDINGS Alignment: There is no appreciable spondylolisthesis. Skull base and vertebrae: The skull base and craniocervical junction regions appear normal. There is no fracture. There are no blastic or lytic bone lesions. Soft tissues and spinal canal: Prevertebral soft tissues and predental space regions are normal. There is no cord or canal hematoma. There are no paraspinous lesions. Disc levels: There is moderate disc space narrowing C6-7 and C7-T1 with milder disc space narrowing at C5-6. 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 Anterior osteophytes are noted at C4, C5, C6, and C7. There is facet hypertrophy at several levels without nerve root edema or effacement. No frank disc extrusion. No stenosis at levels other than the upper cervical region due to the posterior longitudinal ligament ossification. Upper chest: Visualized upper abdominal structures appear normal. Other: None IMPRESSION: CT head: Normal appearing brain parenchyma. No mass or hemorrhage. Areas of paranasal sinus disease noted. CT cervical spine: No fracture or spondylolisthesis. 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 Osteoarthritic change at several other levels without spinal stenosis. No disc extrusion noted. Electronically Signed   By: Bretta BangWilliam  Woodruff III M.D.   On: 03/19/2020 12:17   DG Knee Complete 4 Views Left  Result Date: 03/19/2020 CLINICAL DATA:  Left knee pain after seizure with fall. EXAM: LEFT KNEE - COMPLETE 4+ VIEW COMPARISON:  None. FINDINGS: No acute fracture or dislocation. No definite joint effusion,  although evaluation is limited due to suboptimal positioning on the lateral view. Moderate medial and patellofemoral compartment joint space narrowing. Mild lateral compartment joint space narrowing. Tricompartmental marginal osteophytes. Bone mineralization is normal. Soft tissues are unremarkable. IMPRESSION: 1. No acute osseous abnormality. 2. Tricompartmental osteoarthritis. Electronically Signed   By: Obie Dredge M.D.   On: 03/19/2020 11:53     EKG:  Not done in ED, will get one.   Assessment/Plan Principal Problem:   Seizure [facility]) Active Problems:   Thrombocytopenia (HCC)   OSA on CPAP   Seizure (HCC):  Dr. Iver Nestle of neuro is consulted.  -will place on med-surg bed for obs -Highly appreciate Dr. Rollene Fare  recommendation -Frequent neuro check -Keppra 1500 twice daily, Topamax 200 twice daily, Trileptal 1200 twice daily, Onfi 5 mg qHS -Seizure precaution -When necessary Ativan for seizure - f/u Keppra level and Topamax level - f/u UA and UDS - PT/OT - prn tylenol for left leg pain - EEG  Thrombocytopenia (HCC): Etiology is not clear. Platelet 125, no bleeding tendency. -Follow-up by CBC -f/u with PCP  OSA -on CPAP      DVT ppx: SQ Lovenox Code Status: Full code Family Communication: not done, no family member is at bed side.   Disposition Plan:  Anticipate discharge back to previous environment Consults called:  Dr. Iver Nestle of neuro Admission status and Level of care: Med-Surg:    Med-surg bed for    Status is: Observation  The patient remains OBS appropriate and will d/c before 2 midnights.  Dispo: The patient is from: Home              Anticipated d/c is to: Home              Anticipated d/c date is: 1 day              Patient currently is not medically stable to d/c.   Difficult to place patient No          Date of Service 03/19/2020    Lorretta Harp [facility]   If 7PM-7AM, please contact night-coverage [email or web address] 03/19/2020, 3:53 PM

## 2020-03-19 NOTE — ED Provider Notes (Signed)
-----------------------------------------   3:09 PM on 03/19/2020 -----------------------------------------  EKG viewed and interpreted by myself shows normal sinus rhythm at 94 bpm with a narrow QRS, normal axis, normal intervals, no concerning ST changes.   Minna Antis, MD 03/19/20 1510

## 2020-03-19 NOTE — Consult Note (Addendum)
Neurology Consultation Reason for Consult: Seizures Requesting Physician: Sharyn Creamer  CC: Increased seizure frequency  History is obtained from: Patient and chart review  HPI: Randy Cordova. is a 50 y.o. male with a past medical history of epilepsy on 3 AEDs, multiple concussions, nonalcoholic steatohepatitis, obstructive sleep apnea on CPAP, Type2 diabetes/prediabetes diet-controlled, presenting with breakthrough seizures.  He reports that he was talking on the phone with his wife today and she told him he became quiet and then started to make sounds of vomiting.  She heard him shaking violently enough to knock many things off the table.  She called his roommate who found him unresponsive on the floor.  He gradually came to with a postictal state.  He reports he has seizures so frequently but he scales him on a 1-5 scale; this event would have been a 5 out of 5 on his scale.  He then had another seizure that does not sound as severe, which his roommate rated as a 3 out of 5, lasting about 30 seconds.  At the time of EMS arrival he had another event for which Versed was administered.  Though he has not had further seizures in the ED, at this time he feels like his language is still not back to baseline and reports that typically in the situation he is at high risk of further seizures.  He denies any clear triggers for this event, denying missed medications, poor sleep, significant new stress, signs/symptoms of infection other than some vomiting on Monday (6 days prior to admission) which he felt was secondary to food poisoning.  Regarding his seizure history, he reports that this was preceded by a 14-month migraine which had finally resolved for 1 month prior to developing seizures, and approximately March 2020.  He reports he often has some right-sided weakness after his seizures.  He was previously followed by Dr. Nanci Pina at [facility], and has been trialed on multiple different medications although  he cannot remember them all.  His most severe seizures were in late January 2021 which she reports resulted in being in a medical coma for 4 days.He was last seen in the ED 11/18/2019 after a seizure.  This was induced in the setting of stress and he felt pinned in the bathroom and therefore presented for evaluation of his right shoulder. He was then seen by Dr. Malvin Johns 11/25/2019 for an initial evaluation.  He noted at that the patient was having nearly daily seizures, including one that happened while he was checking into the office which lasted about 1 minute with minimal shaking and no postictal state.  To me he reports that he had a cluster of 5 seizures February 2-4, but did not present to the ED at that time given that he had returned to baseline by the time someone checked on him more thoroughly and he had not sustained any injuries.  Additionally on February 10 he had a smaller seizure event.  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 Edsel Petrin plan was to increase his Trileptal and then eventually consider reducing his Topamax if his seizures became better controlled, patient feels that the Topamax has contributed to reduced vision  Per patient's report, prior AEDs include the following: - Vimpat stopped due to dizziness as  well as expense - Depakote reportedly increased to seizure frequency - Clonazepam sounds familiar, perhaps was ineffective  He does not think he has been tried on Onfi/clobazam before  Prior work-up: Per Dr. Daisy Blossom notes, EEG 11/10/2018 showed the presence of an active ictal focus over the left anterior temporal region with 3 focal seizures seen during the recording; I am unable to review this EEG MRI brain in 2019 reported as  normal other than some mild nonspecific T2 hyperintensities, patient reports his last MRI in October 2020 demonstrated some "dark areas in the left side" but at this time he is unable to find these reports EMU stay in October 2021 was completed but records are not available for my review at this time  Recent AED levels: Oxcarbazepine level on 11/25/2019 was 17 (reference range 10-35) Levetiracetam level on 11/25/2019 was 23.9 (reference range 10-40)  Regarding seizure risk factors: -Was born prematurely in [place] (~29 weeks, 4 pounds 6 ounces) but perhaps was briefly apneic after delivery but otherwise had an unremarkable developmental course other than not walking until 19 months which his mother approach attributed to him being "lazy" -No personal history of meningitis/encephalitis -Multiple concussions throughout childhood, playing rugby, and as a [employer] -No family history of seizures or migraines  ROS: A 14 point ROS was performed and is negative except as noted in the HPI.    Past Medical History:  Diagnosis Date  . Epilepsy (HCC)    History reviewed. No pertinent surgical history.  History reviewed. No pertinent family history. Reports no family history of seizures, developmental delay, or migraine headaches.  Social History:  reports that he has never smoked. He has never used smokeless tobacco. He reports current alcohol use. He reports that he does not use drugs. To me he denies any alcohol use. He had previously been living in [place] but sold his home there given the favorable housing market and has moved to [place] to stay with his best friend.  He is planning to move to [place] in 3 to 4 months and wants to defer any invasive treatment (such as stimulators, surgery) until he is evaluated by specialists there.  His wife has been living in [place] since July.  He reports he is trained his dog Ruby to alert others when he is about to have a seizure and to assist in keeping  his airway clear should he seize.  Exam: Current vital signs: BP (!) 142/79 (BP Location: Left Arm)   Pulse 99   Temp 97.9 F (36.6 C) (Oral)   Resp 18   Ht 6' (1.829 m)   Wt (!) 186 kg   SpO2 98%   BMI 55.61 kg/m  Vital signs in last 24 hours: Temp:  [97.9 F (36.6 C)] 97.9 F (36.6 C) (02/13 1030) Pulse Rate:  [99-103] 99 (02/13 1230) Resp:  [18-22] 18 (02/13 1230) BP: (135-152)/(68-103) 142/79 (02/13 1230) SpO2:  [95 %-98 %] 98 % (02/13 1230) Weight:  [892 kg] 186 kg (02/13 1024)   Physical Exam  Constitutional: Appears generally fatigued Psych: Affect appropriate to situation, tangential answers Eyes: No scleral injection HENT: No OP obstruction, blood encrusted teeth MSK: no joint deformities.  Cardiovascular: Normal rate and regular rhythm.  Respiratory: Effort normal, non-labored breathing GI: Soft.  Obese but not otherwise distended.  There is no tenderness.  Skin: Scattered bruising   Neuro: Mental Status: Patient is awake, alert, oriented to person, place, month, year, and situation. Patient is able to give a clear  and coherent history. He has some mild difficulty naming low-frequency objects such as stethoscope, initially calling it a telescope Cranial Nerves: II: Visual Fields are full. Pupils are equal, round, and reactive to light 4 to 2 mm III,IV, VI: EOMI.  V: Facial sensation is symmetric to light touch VII: Facial movement is symmetric.  VIII: hearing is intact to voice X: Uvula elevates symmetrically XI: Turns head symmetrically both sides XII: tongue is midline without atrophy or fasciculations.  Motor: Tone is normal. Bulk is normal.  Examination is pain limited in the left lower extremity.  He does not have pronator drift of the bilateral upper extremities, and is 5/5 throughout. In the left lower extremity he is able to lift the leg briefly antigravity.  Right lower extremity hip flexion is 5/5, knee extension and flexion are  5/5 Sensory: Sensation is symmetric to light touch and temperature in the arms and legs, other than the dorsomedial aspect of the left lower leg which he reports has reduced temperature sensation, unclear chronicity of this.  Normal perianal and genital sensation per patient report Deep Tendon Reflexes: 2+ and symmetric in the biceps and absent at bilateral patellae, he reports that his reflexes are chronically absent at the patellae given his multiple knee surgeries. 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  Suspect his mild residual aphasia is secondary to post ictal Todd's paralysis, though focal seizure would remain on the differential given the location of his epileptogenic focus.  I will therefore be somewhat aggressive and start a fourth agent, clobazam, in addition to going up on his home oxcarbazepine to the maximum dose.  At this time his strength appears to be at his baseline other than pain limitation of the left lower extremity.  Additionally he does not have any new neck pain.  Therefore will defer further neck imaging at this time.  Recommendations: -UA, UDS -Topiramate, Keppra, oxcarbazepine levels to confirm adherence -Continue Keppra 1500 mg twice daily, will avoid increases given his prior discussions with  hepatologist -Additional 300 mg oxcarbazepine now -Increase oxcarbazepine to 1200 mg twice daily -Continue topiramate 200 mg twice daily -Start Onfi 5 mg nightly -Observation overnight -If he has not returned to baseline tomorrow, would obtain EEG -PT/OT evaluation for safe ability to ambulate and further management of left leg pain per primary team  Brooke Dare MD-PhD [facility] [phone]

## 2020-03-20 DIAGNOSIS — D696 Thrombocytopenia, unspecified: Secondary | ICD-10-CM | POA: Diagnosis not present

## 2020-03-20 DIAGNOSIS — R569 Unspecified convulsions: Secondary | ICD-10-CM | POA: Diagnosis not present

## 2020-03-20 LAB — BASIC METABOLIC PANEL
Anion gap: 7 (ref 5–15)
BUN: 18 mg/dL (ref 6–20)
CO2: 22 mmol/L (ref 22–32)
Calcium: 8.5 mg/dL — ABNORMAL LOW (ref 8.9–10.3)
Chloride: 109 mmol/L (ref 98–111)
Creatinine, Ser: 0.94 mg/dL (ref 0.61–1.24)
GFR, Estimated: >60 mL/min (ref >60)
Glucose, Bld: 123 mg/dL — ABNORMAL HIGH (ref 70–99)
Potassium: 3.4 mmol/L — ABNORMAL LOW (ref 3.5–5.1)
Sodium: 138 mmol/L (ref 135–145)

## 2020-03-20 LAB — CBC
HCT: 41.4 % (ref 39.0–52.0)
Hemoglobin: 13.8 g/dL (ref 13.0–17.0)
MCH: 29.7 pg (ref 26.0–34.0)
MCHC: 33.3 g/dL (ref 30.0–36.0)
MCV: 89 fL (ref 80.0–100.0)
Platelets: 110 10*3/uL — ABNORMAL LOW (ref 150–400)
RBC: 4.65 MIL/uL (ref 4.22–5.81)
RDW: 14.5 % (ref 11.5–15.5)
WBC: 6.5 10*3/uL (ref 4.0–10.5)
nRBC: 0 % (ref 0.0–0.2)

## 2020-03-20 LAB — CK: Total CK: 117 U/L (ref 49–397)

## 2020-03-20 LAB — TOPIRAMATE LEVEL: Topiramate Lvl: 7 ug/mL (ref 2.0–25.0)

## 2020-03-20 LAB — HIV ANTIBODY (ROUTINE TESTING W REFLEX): HIV Screen 4th Generation wRfx: NONREACTIVE

## 2020-03-20 MED ORDER — OXCARBAZEPINE 600 MG PO TABS: 1200.0000 mg | ORAL_TABLET | Freq: Two times a day (BID) | ORAL | 0 refills | Status: DC

## 2020-03-20 MED ORDER — CLOBAZAM 10 MG PO TABS
5.0000 mg | ORAL_TABLET | Freq: Once | ORAL | Status: AC
  Administered 2020-03-20: 15:00:00 5 mg via ORAL
  Filled 2020-03-20: qty 1

## 2020-03-20 MED ORDER — INFLUENZA VAC SPLIT QUAD 0.5 ML IM SUSY: 0.5000 mL | PREFILLED_SYRINGE | INTRAMUSCULAR | Status: DC

## 2020-03-20 MED ORDER — LEVETIRACETAM 750 MG PO TABS
1500.0000 mg | ORAL_TABLET | Freq: Two times a day (BID) | ORAL | Status: DC
  Administered 2020-03-20: 11:00:00 1500 mg via ORAL
  Filled 2020-03-20 (×2): qty 2

## 2020-03-20 MED ORDER — CLOBAZAM 10 MG PO TABS: 5.0000 mg | ORAL_TABLET | Freq: Every day | ORAL | 0 refills | Status: DC

## 2020-03-20 NOTE — Progress Notes (Signed)
Pt states he had a seizure last night at 0100 and that he told staff and they said they would check the monitor. Pt states he has not had another seizure since then.

## 2020-03-20 NOTE — Progress Notes (Signed)
NEUROLOGY CONSULTATION PROGRESS NOTE   Date of service: March 20, 2020 Patient Name: Randy Cordova. MRN:  [id number] DOB:  Nov 17, 1970    Interval Hx   Had a brief seizure last night after midnight, was watching TV when it occurred. Was out for maybe 15 mins. Has some left leg pain. No aphasia noted today.  Vitals   Vitals:   03/20/20 0600 03/20/20 0700 03/20/20 0800 03/20/20 0922  BP: (!) 121/59 (!) 120/55 (!) 119/58 (!) 102/53  Pulse: 80 72 70 74  Resp: 19 16  16   Temp:    97.8 F (36.6 C)  TempSrc:    Oral  SpO2: 95% 95% 97% 96%  Weight:      Height:         Body mass index is 55.61 kg/m.  Physical Exam   General: Laying comfortably in bed; in no acute distress. HENT: Normal oropharynx and mucosa. Normal external appearance of ears and nose. Neck: Supple, no pain or tenderness  CV: No JVD. No peripheral edema.  Pulmonary: Symmetric Chest rise. Normal respiratory effort.  Abdomen: Soft to touch, non-tender.  Ext: No cyanosis, edema, or deformity  Skin: No rash. Normal palpation of skin.   Musculoskeletal: Normal digits and nails by inspection. No clubbing.   Neurologic Examination  Mental status/Cognition: Alert, oriented to self, place, month and year, good attention. Speech/language: Fluent, comprehension intact, object naming intact, repetition intact. Cranial nerves:   CN II Pupils equal and reactive to light, no VF deficits   CN III,IV,VI EOM intact, no gaze preference or deviation, no nystagmus   CN V normal sensation in V1, V2, and V3 segments bilaterally   CN VII no asymmetry, no nasolabial fold flattening   CN VIII normal hearing to speech   CN IX & X normal palatal elevation, no uvular deviation   CN XI 5/5 head turn and 5/5 shoulder shrug bilaterally   CN XII midline tongue protrusion   Motor:  Muscle bulk: normal, tone normal, no tremor. Mvmt Root Nerve  Muscle Right Left Comments  SA C5/6 Ax Deltoid 5 5   EF C5/6 Mc Biceps 5 5   EE C6/7/8  Rad Triceps 5 5   WF C6/7 Med FCR 5 5   WE C7/8 PIN ECU 5 5   F Ab C8/T1 U ADM/FDI 5 5   HF L1/2/3 Fem Illopsoas 5 4   KE L2/3/4 Fem Quad     DF L4/5 D Peron Tib Ant 5 4+   PF S1/2 Tibial Grc/Sol 5 4+    Sensation:  Light touch feels a little weird in LLE due to pain in there.   Pin prick    Temperature    Vibration   Proprioception    Coordination/Complex Motor:  - Finger to Nose intact BL - Heel to shin did not assess due to pain. - Rapid alternating movement are normal - Gait: Deferred.  Labs   Basic Metabolic Panel:  Lab Results  Component Value Date   NA 138 03/20/2020   K 3.4 (L) 03/20/2020   CO2 22 03/20/2020   GLUCOSE 123 (H) 03/20/2020   BUN 18 03/20/2020   CREATININE 0.94 03/20/2020   CALCIUM 8.5 (L) 03/20/2020   GFRNONAA >60 03/20/2020   HbA1c: No results found for: HGBA1C LDL: No results found for: [facility] Urine Drug Screen:     Component Value Date/Time   LABOPIA POSITIVE (A) 03/19/2020 1817   COCAINSCRNUR NONE DETECTED 03/19/2020 1817   LABBENZ POSITIVE (A)  03/19/2020 1817   AMPHETMU NONE DETECTED 03/19/2020 1817   THCU NONE DETECTED 03/19/2020 1817   LABBARB NONE DETECTED 03/19/2020 1817    Alcohol Level     Component Value Date/Time   ETH <10 11/18/2019 2112   Labs reviewed: Urine Drug Screen was positive for Benzos but he was given 2mg  of Versed by EMS. UDS also positive for opiods but he was given morphine in the ED.  No other significant lab abnormality.  Imaging and Diagnostic studies   CTH without contrast: Personally reviewed and CTH was negative for a large hypodensity concerning for a large territory infarct or hyperdensity concerning for an ICH.  Impression   Tyjai Matuszak. is a 50 y.o. 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 Exam today with no aphasia, he does have pain inLLE that limits his ability to exert full strength but that is improving. Workup with no  clear provoking factors with no infectious symptoms, no significant lab abnormalities or imaging abnormalities on CT head.  He was started on Onfi yesterday and given his first dose with no significant side effects.  Recommendations  - Continue Keppra 1500 mg twice daily - Continue Topiramate 200mg  BID - Oxcarbazepine increased to 1200mg  BID - Started on Onfi 5mg  qhs. First dose given yesterday. - Can discharge later today(discussed with hospitalist) - He follows up with Dr. 54 in [facility]. - He has rescue Midazolam and rectal diazepam at home. - No driving until seizure free for 6 months. - Full seizure precautions listed below. _______________________________________________________________  Seizure precautions: Per [facility] statutes, patients with seizures are not allowed to drive until they have been seizure-free for six months and cleared by a physician    Use caution when using heavy equipment or power tools. Avoid working on ladders or at heights. Take showers instead of baths. Ensure the water temperature is not too high on the home water heater. Do not go swimming alone. Do not lock yourself in a room alone (i.e. bathroom). When caring for infants or small children, sit down when holding, feeding, or changing them to minimize risk of injury to the child in the event you have a seizure. Maintain good sleep hygiene. Avoid alcohol.    If patient has another seizure, call 911 and bring them back to the ED if: A.  The seizure lasts longer than 5 minutes.      B.  The patient doesn't wake shortly after the seizure or has new problems such as difficulty seeing, speaking or moving following the seizure C.  The patient was injured during the seizure D.  The patient has a temperature over 102 F (39C) E.  The patient vomited during the seizure and now is having trouble breathing    During the Seizure   - First, ensure adequate ventilation and place patients  on the floor on their left side  Loosen clothing around the neck and ensure the airway is patent. If the patient is clenching the teeth, do not force the mouth open with any object as this can cause severe damage - Remove all items from the surrounding that can be hazardous. The patient may be oblivious to what's happening and may not even know what he or she is doing. If the patient is confused and wandering, either gently guide him/her away and block access to outside areas - Reassure the individual and be comforting - Call 911. In most cases,  the seizure ends before EMS arrives. However, there are cases when seizures may last over 3 to 5 minutes. Or the individual may have developed breathing difficulties or severe injuries. If a pregnant patient or a person with diabetes develops a seizure, it is prudent to call an ambulance. - Finally, if the patient does not regain full consciousness, then call EMS. Most patients will remain confused for about 45 to 90 minutes after a seizure, so you must use judgment in calling for help. - Avoid restraints but make sure the patient is in a bed with padded side rails - Place the individual in a lateral position with the neck slightly flexed; this will help the saliva drain from the mouth and prevent the tongue from falling backward - Remove all nearby furniture and other hazards from the area - Provide verbal assurance as the individual is regaining consciousness - Provide the patient with privacy if possible - Call for help and start treatment as ordered by the caregiver    After the Seizure (Postictal Stage)   After a seizure, most patients experience confusion, fatigue, muscle pain and/or a headache. Thus, one should permit the individual to sleep. For the next few days, reassurance is essential. Being calm and helping reorient the person is also of importance.   Most seizures are painless and end spontaneously. Seizures are not harmful to others but can  lead to complications such as stress on the lungs, brain and the heart. Individuals with prior lung problems may develop labored breathing and respiratory distress.    Thank you for the opportunity to take part in the care of this patient. If you have any further questions, please contact the neurology consultation attending.  Signed,  Erick Blinks [facility] Pager Number [phone]

## 2020-03-20 NOTE — Evaluation (Signed)
Physical Therapy Evaluation Patient Details Name: Randy Cordova. MRN: [id number] DOB: [birth year] Today's Date: 03/20/2020   History of Present Illness  Per MD notes: Pt is a 50 y.o. male with medical history significant of seizure, thrombocytopenia, traumatic brain injury, OSA on CPAP, who presents with seizures.  MD assessment includes: seizure resulting in fall from bed with LLE pain, imaging negative for fracture, thromboyctopenia, and OSA.    Clinical Impression  Pt was pleasant and motivated to participate during the session.  Pt demonstrated good functional strength and stability throughout the session and was limited only minimally by L knee pain.  Pt was steady with transfers from various height surfaces and was able to amb with a RW with no instability noted.  Pt ambulated with mildly antalgic gait pattern with the use of a RW but declined offer to obtain a bariatric RW prior to discharge stating "I can get one while I'm at home if I need one".  Pt declined to practice stair training secondary to history of going up/down stairs with knee pain using his SPC and one rail.  Pt did report understanding of proper sequencing using one rail and a SPC after education provided verbally and visually.  Pt declined any further PT intervention at this time.  Will complete PT orders at this time but will reassess pt pending a change in status upon receipt of new PT orders.       Follow Up Recommendations No PT follow up    Equipment Recommendations  None recommended by PT;Other (comment) (Pt declined offer to obtain bariatric RW)    Recommendations for Other Services       Precautions / Restrictions Precautions Precautions: Other (comment) Precaution Comments: Seizure precautions Restrictions Weight Bearing Restrictions: No      Mobility  Bed Mobility Overal bed mobility: Modified Independent             General bed mobility comments: Extra time and effort only     Transfers Overall transfer level: Modified independent Equipment used: Rolling walker (2 wheeled)             General transfer comment: Good eccentric and concentric control and stability  Ambulation/Gait Ambulation/Gait assistance: Modified independent (Device/Increase time) Gait Distance (Feet): 20 Feet Assistive device: Rolling walker (2 wheeled) Gait Pattern/deviations: Step-to pattern;Antalgic;Decreased stance time - left     General Gait Details: Step-to gait training provided for L knee pain control during weight bearing  Stairs Stairs:  (Pt declined to practice stair training stating "I'll be fine" but verbal/visual demonstration provided for proper sequencing using one rail and the pt's [facility])          Wheelchair Mobility    Modified Rankin (Stroke Patients Only)       Balance Overall balance assessment: No apparent balance deficits (not formally assessed)                                           Pertinent Vitals/Pain Pain Assessment: 0-10 Pain Score: 5  Pain Location: L knee Pain Descriptors / Indicators: Sore Pain Intervention(s): Premedicated before session;Monitored during session    Home Living Family/patient expects to be discharged to:: Private residence Living Arrangements: Non-relatives/Friends Available Help at Discharge: Friend(s);Available PRN/intermittently Type of Home: House Home Access: Stairs to enter Entrance Stairs-Rails: Right;Left Entrance Stairs-Number of Steps: 8 Home Layout: Two level;Able to live on main level with  bedroom/bathroom Home Equipment: Gilmer Mor - single point      Prior Function Level of Independence: Independent         Comments: Ind amb community distances without an AD, no falls not related to seizure activity, PRN SPC use secondary to weakness after seizures, Ind with ADLs     Hand Dominance        Extremity/Trunk Assessment   Upper Extremity Assessment Upper Extremity  Assessment: Overall WFL for tasks assessed    Lower Extremity Assessment Lower Extremity Assessment: LLE deficits/detail LLE: Unable to fully assess due to pain       Communication   Communication: No difficulties  Cognition Arousal/Alertness: Awake/alert Behavior During Therapy: WFL for tasks assessed/performed Overall Cognitive Status: Within Functional Limits for tasks assessed                                        General Comments      Exercises     Assessment/Plan    PT Assessment Patent does not need any further PT services  PT Problem List         PT Treatment Interventions      PT Goals (Current goals can be found in the Care Plan section)  Acute Rehab PT Goals PT Goal Formulation: All assessment and education complete, DC therapy    Frequency     Barriers to discharge        Co-evaluation               AM-PAC PT "6 Clicks" Mobility  Outcome Measure Help needed turning from your back to your side while in a flat bed without using bedrails?: None Help needed moving from lying on your back to sitting on the side of a flat bed without using bedrails?: None Help needed moving to and from a bed to a chair (including a wheelchair)?: None Help needed standing up from a chair using your arms (e.g., wheelchair or bedside chair)?: None Help needed to walk in hospital room?: None Help needed climbing 3-5 steps with a railing? : None 6 Click Score: 24    End of Session Equipment Utilized During Treatment: Gait belt Activity Tolerance: Patient tolerated treatment well Patient left: Other (comment) (Pt left in BR for BM) Nurse Communication: Mobility status;Other (comment) (Pt left in BR for BM, nurse and CNA notified) PT Visit Diagnosis: Difficulty in walking, not elsewhere classified (R26.2);Pain Pain - Right/Left: Left Pain - part of body: Knee    Time: 1856-3149 PT Time Calculation (min) (ACUTE ONLY): 38 min   Charges:   PT  Evaluation $PT Eval Low Complexity: 1 Low          D. Elly Modena PT, DPT 03/20/20, 12:04 PM

## 2020-03-20 NOTE — Discharge Instructions (Signed)
No driving until seizure-free for 6 months 

## 2020-03-20 NOTE — Progress Notes (Signed)
OT Cancellation Note  Patient Details Name: Randy Cordova. MRN: [id number] DOB: 10-10-1970   Cancelled Treatment:    Reason Eval/Treat Not Completed: OT screened, no needs identified, will sign off. Order received, chart reviewed. Per conversation with PT, pt near baseline for mobility and ADLs using RW - limited by L knee pain only. Pt denies need for OT. No skilled acute OT needs identified. Will sign off. Please re-consult if additional needs arise.   Kathie Dike, M.S. OTR/L  03/20/20, 1:31 PM  ascom [phone]

## 2020-03-20 NOTE — Discharge Summary (Signed)
Physician Discharge Summary  Randy Cordova. YTK:[id number] DOB: 1970/08/13 DOA: 03/19/2020  PCP: Pcp, No  Admit date: 03/19/2020 Discharge date: 03/20/2020  Admitted From: Home Disposition:  Home  Recommendations for Outpatient Follow-up:  1. Follow up with PCP in 1-2 weeks 2. Follow up with Neurology as scheduled  Discharge Condition:Stable CODE STATUS:Full Diet recommendation: Regular   Brief/Interim Summary:  50 y.o. male with medical history significant of seizure, thrombocytopenia, traumatic brain injury, OSA on CPAP,who presents with recurrent seizures  Discharge Diagnoses:  Principal Problem:   Seizure (HCC) Active Problems:   Thrombocytopenia (HCC)   OSA on CPAP   Seizure [facility]):   -Neurology consulted -Pt was continued on Keppra 1500 twice daily, Topamax 200 twice daily, Trileptal 1200 twice daily, Onfi 5 mg qHS - PT/OT consulted. No therapy needs identified - prn tylenol for left leg pain -discussed with Neurology. OK to d/c today. Recommendations as follows: - Continue Keppra 1500 mg twice daily - Continue Topiramate 200mg  BID - Oxcarbazepine increased to 1200mg  BID - Started on Onfi 5mg  qhs. First dose given yesterday. - Can discharge later today(discussed with hospitalist) - He follows up with Dr. in [place] [facility]. - He has rescue Midazolam and rectal diazepam at home. - No driving until seizure free for 6 months  Thrombocytopenia (HCC): Etiology is not clear. Platelet 125, no bleeding tendency. -f/u with PCP -Remained stable  OSA -on CPAP   Discharge Instructions   Allergies as of 03/20/2020      Reactions   Other Other (See Comments)   Uncoded Allergy. Allergen: ARTIFICIAL SWEETNERS   Aspartame And Phenylalanine    Headache      Medication List    TAKE these medications   cloBAZam 10 MG tablet Commonly known as: ONFI Take 0.5 tablets (5 mg total) by mouth at bedtime for 10 days.   levETIRAcetam 1000 MG  tablet Commonly known as: KEPPRA Take 1,500 mg by mouth 2 (two) times daily.   oxcarbazepine 600 MG tablet Commonly known as: TRILEPTAL Take 2 tablets (1,200 mg total) by mouth 2 (two) times daily. What changed:   medication strength  how much to take   topiramate 200 MG tablet Commonly known as: TOPAMAX Take 200 mg by mouth 2 (two) times daily.       Follow-up Information    Follow up with PCP Follow up in 2 week(s).        Betsey Holiday, MD Follow up.   Specialty: Neurology Why: as scheduled Contact information: [address] [facility] [place] [facility] [phone]              Allergies  Allergen Reactions  . Other Other (See Comments)    Uncoded Allergy. Allergen: ARTIFICIAL SWEETNERS  . Aspartame And Phenylalanine     Headache    Consultations:  Neurology  Procedures/Studies: DG Tibia/Fibula Left  Result Date: 03/19/2020 CLINICAL DATA:  Fall, lower leg pain laterally EXAM: LEFT TIBIA AND FIBULA - 2 VIEW COMPARISON:  Knee radiographs from 03/19/2020 FINDINGS: Considerable tricompartmental osteoarthritis in the knee. No fracture or acute bony findings identified. No foreign body is observed. IMPRESSION: 1. Considerable tricompartmental osteoarthritis in the knee. No tibia/fibular fracture is identified. Electronically Signed   By: 03/21/2020 M.D.   On: 03/19/2020 13:00   CT Head Wo Contrast  Result Date: 03/19/2020 CLINICAL DATA:  Seizure with fall EXAM: CT HEAD WITHOUT CONTRAST CT CERVICAL SPINE WITHOUT CONTRAST TECHNIQUE: Multidetector CT imaging of the head and  cervical spine was performed following the standard protocol without intravenous contrast. Multiplanar CT image reconstructions of the cervical spine were also generated. COMPARISON:  None. FINDINGS: CT HEAD FINDINGS Brain: Ventricles and sulci are normal in size and configuration. There is no intracranial mass, hemorrhage, extra-axial fluid  collection, or midline shift. Brain parenchyma appears unremarkable. No evident acute infarct. Vascular: No hyperdense vessels. No appreciable vascular calcification. Skull: Bony calvarium appears intact. Sinuses/Orbits: There is an apparent retention cyst in the left sphenoid sinus. There is mild mucosal thickening in several ethmoid air cells. Orbits appear symmetric bilaterally. Other: Mastoid air cells are clear. CT CERVICAL SPINE FINDINGS Alignment: There is no appreciable spondylolisthesis. Skull base and vertebrae: The skull base and craniocervical junction regions appear normal. There is no fracture. There are no blastic or lytic bone lesions. Soft tissues and spinal canal: Prevertebral soft tissues and predental space regions are normal. There is no cord or canal hematoma. There are no paraspinous lesions. Disc levels: There is moderate disc space narrowing C6-7 and C7-T1 with milder disc space narrowing at C5-6. 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 Anterior osteophytes are noted at C4, C5, C6, and C7. There is facet hypertrophy at several levels without nerve root edema or effacement. No frank disc extrusion. No stenosis at levels other than the upper cervical region due to the posterior longitudinal ligament ossification. Upper chest: Visualized upper abdominal structures appear normal. Other: None IMPRESSION: CT head: Normal appearing brain parenchyma. No mass or hemorrhage. Areas of paranasal sinus disease noted. CT cervical spine: No fracture or spondylolisthesis. 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 Osteoarthritic change at several other levels without spinal stenosis. No disc extrusion noted. Electronically Signed   By:  Bretta Bang III M.D.   On: 03/19/2020 12:17   CT Cervical Spine Wo Contrast  Result Date: 03/19/2020 CLINICAL DATA:  Seizure with fall EXAM: CT HEAD WITHOUT CONTRAST CT CERVICAL SPINE WITHOUT CONTRAST TECHNIQUE: Multidetector CT imaging of the head and cervical spine was performed following the standard protocol without intravenous contrast. Multiplanar CT image reconstructions of the cervical spine were also generated. COMPARISON:  None. FINDINGS: CT HEAD FINDINGS Brain: Ventricles and sulci are normal in size and configuration. There is no intracranial mass, hemorrhage, extra-axial fluid collection, or midline shift. Brain parenchyma appears unremarkable. No evident acute infarct. Vascular: No hyperdense vessels. No appreciable vascular calcification. Skull: Bony calvarium appears intact. Sinuses/Orbits: There is an apparent retention cyst in the left sphenoid sinus. There is mild mucosal thickening in several ethmoid air cells. Orbits appear symmetric bilaterally. Other: Mastoid air cells are clear. CT CERVICAL SPINE FINDINGS Alignment: There is no appreciable spondylolisthesis. Skull base and vertebrae: The skull base and craniocervical junction regions appear normal. There is no fracture. There are no blastic or lytic bone lesions. Soft tissues and spinal canal: Prevertebral soft tissues and predental space regions are normal. There is no cord or canal hematoma. There are no paraspinous lesions. Disc levels: There is moderate disc space narrowing C6-7 and C7-T1 with milder disc space narrowing at C5-6. 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 Anterior osteophytes are noted at  C4, C5, C6, and C7. There is facet hypertrophy at several levels without nerve root edema or effacement. No frank disc extrusion. No  stenosis at levels other than the upper cervical region due to the posterior longitudinal ligament ossification. Upper chest: Visualized upper abdominal structures appear normal. Other: None IMPRESSION: CT head: Normal appearing brain parenchyma. No mass or hemorrhage. Areas of paranasal sinus disease noted. CT cervical spine: No fracture or spondylolisthesis. 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 Osteoarthritic change at several other levels without spinal stenosis. No disc extrusion noted. Electronically Signed   By: Bretta Bang III M.D.   On: 03/19/2020 12:17   DG Knee Complete 4 Views Left  Result Date: 03/19/2020 CLINICAL DATA:  Left knee pain after seizure with fall. EXAM: LEFT KNEE - COMPLETE 4+ VIEW COMPARISON:  None. FINDINGS: No acute fracture or dislocation. No definite joint effusion, although evaluation is limited due to suboptimal positioning on the lateral view. Moderate medial and patellofemoral compartment joint space narrowing. Mild lateral compartment joint space narrowing. Tricompartmental marginal osteophytes. Bone mineralization is normal. Soft tissues are unremarkable. IMPRESSION: 1. No acute osseous abnormality. 2. Tricompartmental osteoarthritis. Electronically Signed   By: Obie Dredge M.D.   On: 03/19/2020 11:53     Subjective: Eager to go home  Discharge Exam: Vitals:   03/20/20 0922 03/20/20 1229  BP: (!) 102/53 (!) 103/46  Pulse: 74 85  Resp: 16 20  Temp: 97.8 F (36.6 C) 97.8 F (36.6 C)  SpO2: 96% 92%   Vitals:   03/20/20 0700 03/20/20 0800 03/20/20 0922 03/20/20 1229  BP: (!) 120/55 (!) 119/58 (!) 102/53 (!) 103/46  Pulse: 72 70 74 85  Resp: 16  16 20   Temp:   97.8 F (36.6 C) 97.8 F (36.6 C)  TempSrc:   Oral   SpO2: 95% 97% 96% 92%  Weight:      Height:        General: Pt is alert, awake, not in acute distress Cardiovascular: RRR, S1/S2 +, no rubs, no  gallops Respiratory: CTA bilaterally, no wheezing, no rhonchi Abdominal: Soft, NT, ND, bowel sounds + Extremities: no edema, no cyanosis   The results of significant diagnostics from this hospitalization (including imaging, microbiology, ancillary and laboratory) are listed below for reference.     Microbiology: Recent Results (from the past 240 hour(s))  Resp Panel by RT-PCR (Flu A&B, Covid) Nasopharyngeal Swab     Status: None   Collection Time: 03/19/20 10:28 AM   Specimen: Nasopharyngeal Swab; Nasopharyngeal(NP) swabs in vial transport medium  Result Value Ref Range Status   SARS Coronavirus 2 by RT PCR NEGATIVE NEGATIVE Final    Comment: (NOTE) SARS-CoV-2 target nucleic acids are NOT DETECTED.  The SARS-CoV-2 RNA is generally detectable in upper respiratory specimens during the acute phase of infection. The lowest concentration of SARS-CoV-2 viral copies this assay can detect is 138 copies/mL. A negative result does not preclude SARS-Cov-2 infection and should not be used as the sole basis for treatment or other patient management decisions. A negative result may occur with  improper specimen collection/handling, submission of specimen other than nasopharyngeal swab, presence of viral mutation(s) within the areas targeted by this assay, and inadequate number of viral copies(<138 copies/mL). A negative result must be combined with clinical observations, patient history, and epidemiological information. The expected result is Negative.  Fact Sheet for Patients:  03/21/20  Fact Sheet for Healthcare Providers:  BloggerCourse.com  This test is no t yet approved or cleared by the [place] FDA and  has been authorized for detection and/or diagnosis of SARS-CoV-2 by FDA under an Emergency Use Authorization (EUA). This EUA will remain  in effect (meaning this test can be used) for the duration of the COVID-19  declaration under Section 564(b)(1) of the Act, 21 U.S.C.section 360bbb-3(b)(1), unless the authorization is terminated  or revoked sooner.       Influenza A by PCR NEGATIVE NEGATIVE Final   Influenza B by PCR NEGATIVE NEGATIVE Final    Comment: (NOTE) The Xpert Xpress SARS-CoV-2/FLU/RSV plus assay is intended as an aid in the diagnosis of influenza from Nasopharyngeal swab specimens and should not be used as a sole basis for treatment. Nasal washings and aspirates are unacceptable for Xpert Xpress SARS-CoV-2/FLU/RSV testing.  Fact Sheet for Patients: BloggerCourse.comhttps://www.fda.gov/media/152166/download  Fact Sheet for Healthcare Providers: SeriousBroker.ithttps://www.fda.gov/media/152162/download  This test is not yet approved or cleared by the [place] FDA and has been authorized for detection and/or diagnosis of SARS-CoV-2 by FDA under an Emergency Use Authorization (EUA). This EUA will remain in effect (meaning this test can be used) for the duration of the COVID-19 declaration under Section 564(b)(1) of the Act, 21 U.S.C. section 360bbb-3(b)(1), unless the authorization is terminated or revoked.  Performed at [facility], [address] [place], [place] [id number]      Labs: BNP (last 3 results) No results for input(s): BNP in the last 8760 hours. Basic Metabolic Panel: Recent Labs  Lab 03/19/20 1026 03/20/20 0403  NA 138 138  K 3.7 3.4*  CL 109 109  CO2 16* 22  GLUCOSE 184* 123*  BUN 15 18  CREATININE 1.08 0.94  CALCIUM 8.7* 8.5*   Liver Function Tests: No results for input(s): AST, ALT, ALKPHOS, BILITOT, PROT, ALBUMIN in the last 168 hours. No results for input(s): LIPASE, AMYLASE in the last 168 hours. No results for input(s): AMMONIA in the last 168 hours. CBC: Recent Labs  Lab 03/19/20 1026 03/20/20 0403  WBC 8.3 6.5  HGB 14.3 13.8  HCT 43.4 41.4  MCV 89.1 89.0  PLT 125* 110*   Cardiac Enzymes: Recent Labs  Lab 03/19/20 1028 03/20/20 0403  CKTOTAL  65 117   BNP: Invalid input(s): POCBNP CBG: Recent Labs  Lab 03/19/20 1117  GLUCAP 145*   D-Dimer No results for input(s): DDIMER in the last 72 hours. Hgb A1c No results for input(s): HGBA1C in the last 72 hours. Lipid Profile No results for input(s): CHOL, HDL, LDLCALC, TRIG, CHOLHDL, LDLDIRECT in the last 72 hours. Thyroid function studies No results for input(s): TSH, T4TOTAL, T3FREE, THYROIDAB in the last 72 hours.  Invalid input(s): FREET3 Anemia work up No results for input(s): VITAMINB12, FOLATE, FERRITIN, TIBC, IRON, RETICCTPCT in the last 72 hours. Urinalysis    Component Value Date/Time   COLORURINE YELLOW (A) 03/19/2020 1817   APPEARANCEUR HAZY (A) 03/19/2020 1817   LABSPEC 1.024 03/19/2020 1817   PHURINE 5.0 03/19/2020 1817   GLUCOSEU NEGATIVE 03/19/2020 1817   HGBUR NEGATIVE 03/19/2020 1817   BILIRUBINUR NEGATIVE 03/19/2020 1817   KETONESUR NEGATIVE 03/19/2020 1817   PROTEINUR NEGATIVE 03/19/2020 1817   NITRITE NEGATIVE 03/19/2020 1817   LEUKOCYTESUR NEGATIVE 03/19/2020 1817   Sepsis Labs Invalid input(s): PROCALCITONIN,  WBC,  LACTICIDVEN Microbiology Recent Results (from the past 240 hour(s))  Resp Panel by RT-PCR (Flu A&B, Covid) Nasopharyngeal Swab     Status: None   Collection Time: 03/19/20 10:28 AM  Specimen: Nasopharyngeal Swab; Nasopharyngeal(NP) swabs in vial transport medium  Result Value Ref Range Status   SARS Coronavirus 2 by RT PCR NEGATIVE NEGATIVE Final    Comment: (NOTE) SARS-CoV-2 target nucleic acids are NOT DETECTED.  The SARS-CoV-2 RNA is generally detectable in upper respiratory specimens during the acute phase of infection. The lowest concentration of SARS-CoV-2 viral copies this assay can detect is 138 copies/mL. A negative result does not preclude SARS-Cov-2 infection and should not be used as the sole basis for treatment or other patient management decisions. A negative result may occur with  improper specimen  collection/handling, submission of specimen other than nasopharyngeal swab, presence of viral mutation(s) within the areas targeted by this assay, and inadequate number of viral copies(<138 copies/mL). A negative result must be combined with clinical observations, patient history, and epidemiological information. The expected result is Negative.  Fact Sheet for Patients:  BloggerCourse.com  Fact Sheet for Healthcare Providers:  SeriousBroker.it  This test is no t yet approved or cleared by the [place] FDA and  has been authorized for detection and/or diagnosis of SARS-CoV-2 by FDA under an Emergency Use Authorization (EUA). This EUA will remain  in effect (meaning this test can be used) for the duration of the COVID-19 declaration under Section 564(b)(1) of the Act, 21 U.S.C.section 360bbb-3(b)(1), unless the authorization is terminated  or revoked sooner.       Influenza A by PCR NEGATIVE NEGATIVE Final   Influenza B by PCR NEGATIVE NEGATIVE Final    Comment: (NOTE) The Xpert Xpress SARS-CoV-2/FLU/RSV plus assay is intended as an aid in the diagnosis of influenza from Nasopharyngeal swab specimens and should not be used as a sole basis for treatment. Nasal washings and aspirates are unacceptable for Xpert Xpress SARS-CoV-2/FLU/RSV testing.  Fact Sheet for Patients: BloggerCourse.com  Fact Sheet for Healthcare Providers: SeriousBroker.it  This test is not yet approved or cleared by the [place] FDA and has been authorized for detection and/or diagnosis of SARS-CoV-2 by FDA under an Emergency Use Authorization (EUA). This EUA will remain in effect (meaning this test can be used) for the duration of the COVID-19 declaration under Section 564(b)(1) of the Act, 21 U.S.C. section 360bbb-3(b)(1), unless the authorization is terminated or revoked.  Performed at [facility], [address], [place], [place] [address]    Time spent: 30 min  SIGNED:   Rickey Barbara, MD  [facility] 03/20/2020, 2:26 PM  If 7PM-7AM, please contact night-coverage

## 2020-03-22 LAB — MISC LABCORP TEST (SEND OUT)

## 2020-03-22 LAB — 10-HYDROXYCARBAZEPINE: Triliptal/MTB(Oxcarbazepin): 14 ug/mL (ref 10–35)

## 2020-03-22 LAB — LEVETIRACETAM LEVEL: Levetiracetam Lvl: 22.9 ug/mL (ref 10.0–40.0)

## 2020-04-17 ENCOUNTER — Emergency Department: Payer: Medicaid Other

## 2020-04-17 ENCOUNTER — Inpatient Hospital Stay
Admission: EM | Admit: 2020-04-17 | Discharge: 2020-04-23 | DRG: 101 | Disposition: A | Discharge status: Home / Self Care | Payer: Medicaid Other | Attending: Family Medicine | Admitting: Family Medicine

## 2020-04-17 DIAGNOSIS — G4733 Obstructive sleep apnea (adult) (pediatric): Secondary | ICD-10-CM | POA: Diagnosis not present

## 2020-04-17 DIAGNOSIS — E662 Morbid (severe) obesity with alveolar hypoventilation: Secondary | ICD-10-CM | POA: Diagnosis not present

## 2020-04-17 DIAGNOSIS — Z8261 Family history of arthritis: Secondary | ICD-10-CM

## 2020-04-17 DIAGNOSIS — S8991XA Unspecified injury of right lower leg, initial encounter: Secondary | ICD-10-CM | POA: Diagnosis present

## 2020-04-17 DIAGNOSIS — R569 Unspecified convulsions: Secondary | ICD-10-CM

## 2020-04-17 DIAGNOSIS — Z79899 Other long term (current) drug therapy: Secondary | ICD-10-CM

## 2020-04-17 DIAGNOSIS — E876 Hypokalemia: Secondary | ICD-10-CM | POA: Diagnosis present

## 2020-04-17 DIAGNOSIS — D696 Thrombocytopenia, unspecified: Secondary | ICD-10-CM | POA: Diagnosis present

## 2020-04-17 DIAGNOSIS — G40919 Epilepsy, unspecified, intractable, without status epilepticus: Secondary | ICD-10-CM

## 2020-04-17 DIAGNOSIS — G40901 Epilepsy, unspecified, not intractable, with status epilepticus: Secondary | ICD-10-CM

## 2020-04-17 DIAGNOSIS — Z87820 Personal history of traumatic brain injury: Secondary | ICD-10-CM

## 2020-04-17 DIAGNOSIS — Z9989 Dependence on other enabling machines and devices: Secondary | ICD-10-CM

## 2020-04-17 DIAGNOSIS — M25561 Pain in right knee: Secondary | ICD-10-CM | POA: Diagnosis present

## 2020-04-17 DIAGNOSIS — E872 Acidosis: Secondary | ICD-10-CM | POA: Diagnosis present

## 2020-04-17 DIAGNOSIS — Z23 Encounter for immunization: Secondary | ICD-10-CM

## 2020-04-17 DIAGNOSIS — Z9109 Other allergy status, other than to drugs and biological substances: Secondary | ICD-10-CM

## 2020-04-17 DIAGNOSIS — M1711 Unilateral primary osteoarthritis, right knee: Secondary | ICD-10-CM | POA: Diagnosis present

## 2020-04-17 DIAGNOSIS — Z6841 Body Mass Index (BMI) 40.0 and over, adult: Secondary | ICD-10-CM

## 2020-04-17 DIAGNOSIS — G40401 Other generalized epilepsy and epileptic syndromes, not intractable, with status epilepticus: Principal | ICD-10-CM | POA: Diagnosis present

## 2020-04-17 DIAGNOSIS — X501XXA Overexertion from prolonged static or awkward postures, initial encounter: Secondary | ICD-10-CM

## 2020-04-17 DIAGNOSIS — Z888 Allergy status to other drugs, medicaments and biological substances status: Secondary | ICD-10-CM

## 2020-04-17 DIAGNOSIS — W19XXXA Unspecified fall, initial encounter: Secondary | ICD-10-CM | POA: Diagnosis present

## 2020-04-17 DIAGNOSIS — M47812 Spondylosis without myelopathy or radiculopathy, cervical region: Secondary | ICD-10-CM | POA: Diagnosis present

## 2020-04-17 DIAGNOSIS — G40409 Other generalized epilepsy and epileptic syndromes, not intractable, without status epilepticus: Secondary | ICD-10-CM | POA: Diagnosis present

## 2020-04-17 DIAGNOSIS — Z20822 Contact with and (suspected) exposure to covid-19: Secondary | ICD-10-CM | POA: Diagnosis present

## 2020-04-17 LAB — CBC
HCT: 40.6 % (ref 39.0–52.0)
Hemoglobin: 13.5 g/dL (ref 13.0–17.0)
MCH: 29.6 pg (ref 26.0–34.0)
MCHC: 33.3 g/dL (ref 30.0–36.0)
MCV: 89 fL (ref 80.0–100.0)
Platelets: 106 10*3/uL — ABNORMAL LOW (ref 150–400)
RBC: 4.56 MIL/uL (ref 4.22–5.81)
RDW: 14.5 % (ref 11.5–15.5)
WBC: 5 10*3/uL (ref 4.0–10.5)
nRBC: 0 % (ref 0.0–0.2)

## 2020-04-17 LAB — BASIC METABOLIC PANEL
Anion gap: 7 (ref 5–15)
BUN: 17 mg/dL (ref 6–20)
CO2: 20 mmol/L — ABNORMAL LOW (ref 22–32)
Calcium: 8.7 mg/dL — ABNORMAL LOW (ref 8.9–10.3)
Chloride: 112 mmol/L — ABNORMAL HIGH (ref 98–111)
Creatinine, Ser: 0.92 mg/dL (ref 0.61–1.24)
GFR, Estimated: >60 mL/min (ref >60)
Glucose, Bld: 111 mg/dL — ABNORMAL HIGH (ref 70–99)
Potassium: 3.8 mmol/L (ref 3.5–5.1)
Sodium: 139 mmol/L (ref 135–145)

## 2020-04-17 MED ORDER — SODIUM CHLORIDE 0.9 % IV SOLN
75.0000 mL/h | INTRAVENOUS | Status: DC
  Administered 2020-04-17 – 2020-04-19 (×4): 75 mL/h via INTRAVENOUS

## 2020-04-17 MED ORDER — ACETAMINOPHEN 325 MG PO TABS: 325.0000 mg | ORAL_TABLET | ORAL | Status: DC | PRN

## 2020-04-17 MED ORDER — ONDANSETRON HCL 4 MG/2ML IJ SOLN: 4.0000 mg | Freq: Four times a day (QID) | INTRAMUSCULAR | Status: DC | PRN

## 2020-04-17 MED ORDER — KETOROLAC TROMETHAMINE 15 MG/ML IJ SOLN
15.0000 mg | Freq: Three times a day (TID) | INTRAMUSCULAR | Status: DC | PRN
  Filled 2020-04-17: qty 1

## 2020-04-17 MED ORDER — LEVETIRACETAM 750 MG PO TABS
1500.0000 mg | ORAL_TABLET | Freq: Two times a day (BID) | ORAL | Status: DC
  Administered 2020-04-18 – 2020-04-20 (×5): 1500 mg via ORAL
  Filled 2020-04-17: qty 3
  Filled 2020-04-17 (×5): qty 2

## 2020-04-17 MED ORDER — ENOXAPARIN SODIUM 100 MG/ML ~~LOC~~ SOLN
0.5000 mg/kg | SUBCUTANEOUS | Status: DC
  Administered 2020-04-17 – 2020-04-22 (×6): 92.5 mg via SUBCUTANEOUS
  Filled 2020-04-17 (×8): qty 1

## 2020-04-17 MED ORDER — LORAZEPAM 2 MG/ML IJ SOLN
1.0000 mg | INTRAMUSCULAR | Status: DC | PRN
  Administered 2020-04-18 – 2020-04-20 (×5): 2 mg via INTRAVENOUS
  Filled 2020-04-17 (×5): qty 1

## 2020-04-17 MED ORDER — CLOBAZAM 10 MG PO TABS
5.0000 mg | ORAL_TABLET | Freq: Every day | ORAL | Status: DC
  Administered 2020-04-18 – 2020-04-22 (×5): 5 mg via ORAL
  Filled 2020-04-17 (×5): qty 1

## 2020-04-17 MED ORDER — CLOBAZAM 10 MG PO TABS: 5.0000 mg | ORAL_TABLET | Freq: Every day | ORAL | Status: DC

## 2020-04-17 MED ORDER — ONDANSETRON HCL 4 MG PO TABS: 4.0000 mg | ORAL_TABLET | Freq: Four times a day (QID) | ORAL | Status: DC | PRN

## 2020-04-17 MED ORDER — ACETAMINOPHEN 650 MG RE SUPP: 325.0000 mg | RECTAL | Status: DC | PRN

## 2020-04-17 MED ORDER — OXCARBAZEPINE 300 MG PO TABS
1200.0000 mg | ORAL_TABLET | Freq: Two times a day (BID) | ORAL | Status: DC
  Administered 2020-04-17 – 2020-04-23 (×12): 1200 mg via ORAL
  Filled 2020-04-17 (×14): qty 4

## 2020-04-17 MED ORDER — TOPIRAMATE 100 MG PO TABS
200.0000 mg | ORAL_TABLET | Freq: Two times a day (BID) | ORAL | Status: DC
  Administered 2020-04-17 – 2020-04-23 (×12): 200 mg via ORAL
  Filled 2020-04-17 (×2): qty 2
  Filled 2020-04-17: qty 8
  Filled 2020-04-17: qty 2
  Filled 2020-04-17: qty 8
  Filled 2020-04-17 (×10): qty 2

## 2020-04-17 MED ORDER — KETOROLAC TROMETHAMINE 15 MG/ML IJ SOLN
15.0000 mg | Freq: Three times a day (TID) | INTRAMUSCULAR | Status: DC | PRN
  Administered 2020-04-17 – 2020-04-19 (×3): 15 mg via INTRAVENOUS
  Filled 2020-04-17 (×6): qty 1

## 2020-04-17 MED ORDER — LEVETIRACETAM IN NACL 1500 MG/100ML IV SOLN
1500.0000 mg | Freq: Once | INTRAVENOUS | Status: AC
  Administered 2020-04-17: 1500 mg via INTRAVENOUS
  Filled 2020-04-17: qty 100

## 2020-04-17 MED ORDER — CLOBAZAM 10 MG PO TABS
5.0000 mg | ORAL_TABLET | Freq: Every day | ORAL | Status: DC
  Administered 2020-04-17: 5 mg via ORAL
  Filled 2020-04-17: qty 1

## 2020-04-17 NOTE — ED Notes (Signed)
Seizure pads applied to bed.  

## 2020-04-17 NOTE — ED Triage Notes (Addendum)
Patient BIBA from home for increased seizures today. Patient had seizure at 1000 this morning. Then another prior to calling EMS, then a 3rd with EMS. Per EMS grand mal seizure noted for 45 seconds, post tictal for 2 min following, patient given 2mg  versed IM. Patient reports he takes keppra, topamax and trileptal. Patient A&OX3. Patient also reports right knee pain.

## 2020-04-17 NOTE — ED Provider Notes (Signed)
[facility] Emergency Department Provider Note  ____________________________________________   Event Date/Time   First MD Initiated Contact with Patient 04/17/20 1804     (approximate)  I have reviewed the triage vital signs and the nursing notes.   HISTORY  Chief Complaint Seizures    HPI Randy Mella. is a 50 y.o. male with epilepsy who comes in for seizures.  Patient had 3 seizures today.  First seizure was around 10:00 then just prior to EMS arrival and then 1 with EMS.  Full generalized tonic-clonic, intermittent, nothing particular brings it on, did get 2 mg of IM Versed which stopped the seizure.  Postictal afterwards.  According to patient he was admitted back in February and the added on another medication he is not sure what it was.  He states that he had it for 10 days but then ran out and he was unable to get follow-up with neurology.  Patient also reports that during the seizure he did fall the second time and hit his head.  He also reports that he twisted his right knee and he is having some pain there as well.  On review of records patient was admitted back in February and was started on Onfi 5 mg nightly.            Past Medical History:  Diagnosis Date  . Epilepsy (HCC)   . OSA on CPAP   . TBI (traumatic brain injury) (HCC)   . Thrombocytopenia [facility])     Patient Active Problem List   Diagnosis Date Noted  . Seizure (HCC) 03/19/2020  . Thrombocytopenia (HCC)   . OSA on CPAP     Past Surgical History:  Procedure Laterality Date  . BACK SURGERY    . CHOLECYSTECTOMY    . Head surgery     Due to brain injury  . KNEE SURGERY      Prior to Admission medications   Medication Sig Start Date End Date Taking? Authorizing Provider  cloBAZam (ONFI) 10 MG tablet Take 0.5 tablets (5 mg total) by mouth at bedtime for 10 days. 03/20/20 03/30/20  Jerald Kief, MD  levETIRAcetam (KEPPRA) 1000 MG tablet Take 1,500 mg by mouth 2 (two)  times daily.    [provider]  Oxcarbazepine (TRILEPTAL) 600 MG tablet Take 2 tablets (1,200 mg total) by mouth 2 (two) times daily. 03/20/20 04/19/20  Jerald Kief, MD  topiramate (TOPAMAX) 200 MG tablet Take 200 mg by mouth 2 (two) times daily.    [provider]    Allergies Other and Aspartame and phenylalanine  Family History  Problem Relation Age of Onset  . Arthritis/Rheumatoid Mother     Social History Social History   Tobacco Use  . Smoking status: Never Smoker  . Smokeless tobacco: Never Used  Vaping Use  . Vaping Use: Every day  Substance Use Topics  . Alcohol use: Yes    Comment: rarely  . Drug use: Never      Review of Systems Constitutional: No fever/chills Eyes: No visual changes. ENT: No sore throat. Cardiovascular: Denies chest pain. Respiratory: Denies shortness of breath. Gastrointestinal: No abdominal pain.  No nausea, no vomiting.  No diarrhea.  No constipation. Genitourinary: Negative for dysuria. Musculoskeletal: Negative for back pain.  Positive right knee pain Skin: Negative for rash. Neurological: Negative for headaches, focal weakness or numbness.  Positive seizure All other ROS negative ____________________________________________   PHYSICAL EXAM:  VITAL SIGNS: ED Triage Vitals  Enc  Vitals Group     BP 04/17/20 1804 (!) 103/51     Pulse Rate 04/17/20 1802 97     Resp 04/17/20 1802 15     Temp 04/17/20 1802 98.6 F (37 C)     Temp Source 04/17/20 1802 Oral     SpO2 04/17/20 1802 98 %     Weight 04/17/20 1804 (!) 405 lb (183.7 kg)     Height 04/17/20 1804 6' (1.829 m)     Head Circumference --      Peak Flow --      Pain Score 04/17/20 1803 10     Pain Loc --      Pain Edu? --      Excl. in GC? --     Constitutional: Alert and oriented. Well appearing and in no acute distress. Eyes: Conjunctivae are normal. EOMI. Head: Atraumatic. Nose: No congestion/rhinnorhea. Mouth/Throat: Mucous membranes are  moist.   Neck: No stridor. Trachea Midline. FROM Cardiovascular: Normal rate, regular rhythm. Grossly normal heart sounds.  Good peripheral circulation. Respiratory: Normal respiratory effort.  No retractions. Lungs CTAB. Gastrointestinal: Soft and nontender. No distention. No abdominal bruits.  Musculoskeletal: Tenderness with no obvious swelling or bruising to the right knee.  No obvious hip tenderness or tenderness lower in the leg. Neurologic:  Normal speech and language. No gross focal neurologic deficits are appreciated.  Skin:  Skin is warm, dry and intact. No rash noted. Psychiatric: Mood and affect are normal. Speech and behavior are normal. GU: Deferred   ____________________________________________   LABS (all labs ordered are listed, but only abnormal results are displayed)  Labs Reviewed  BASIC METABOLIC PANEL - Abnormal; Notable for the following components:      Result Value   Chloride 112 (*)    CO2 20 (*)    Glucose, Bld 111 (*)    Calcium 8.7 (*)    All other components within normal limits  CBC - Abnormal; Notable for the following components:   Platelets 106 (*)    All other components within normal limits  LEVETIRACETAM LEVEL  TOPIRAMATE LEVEL  CBG MONITORING, ED   ____________________________________________   ED ECG REPORT I, Randy Cordova, the attending physician, personally viewed and interpreted this ECG.  Normal sinus rate of 83, no ST elevation, no T wave inversions, normal intervals ____________________________________________  RADIOLOGY Vela Prose, personally viewed and evaluated these images (plain radiographs) as part of my medical decision making, as well as reviewing the written report by the radiologist.  ED MD interpretation:    Official radiology report(s): CT Head Wo Contrast  Result Date: 04/17/2020 CLINICAL DATA:  Multiple seizures, postictal EXAM: CT HEAD WITHOUT CONTRAST TECHNIQUE: Contiguous axial images were obtained from  the base of the skull through the vertex without intravenous contrast. COMPARISON:  03/19/2020 FINDINGS: Brain: No acute infarct or hemorrhage. Lateral ventricles and midline structures are unremarkable. No acute extra-axial fluid collections. No mass effect. Vascular: No hyperdense vessel or unexpected calcification. Skull: Normal. Negative for fracture or focal lesion. Sinuses/Orbits: Gas fluid levels within the left sphenoid sinus. Remaining paranasal sinuses are clear. Other: None. IMPRESSION: 1. No acute intracranial process. 2. Chronic left sphenoid sinus disease. Electronically Signed   By: Sharlet Salina M.D.   On: 04/17/2020 19:13   CT Cervical Spine Wo Contrast  Result Date: 04/17/2020 CLINICAL DATA:  Seizures, neck trauma, postictal EXAM: CT CERVICAL SPINE WITHOUT CONTRAST TECHNIQUE: Multidetector CT imaging of the cervical spine was performed without intravenous contrast. Multiplanar  CT image reconstructions were also generated. COMPARISON:  03/19/2020 FINDINGS: Alignment: Alignment is anatomic. Skull base and vertebrae: No acute fracture. No primary bone lesion or focal pathologic process. Soft tissues and spinal canal: No prevertebral fluid or swelling. No visible canal hematoma. 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 There is mild diffuse cervical spondylosis. Right predominant neural foraminal encroachment is seen at C3-4 and C4-5 due to uncovertebral hypertrophy. Upper chest: Airways patent.  Lung apices are clear. Other: Reconstructed images demonstrate no additional findings. IMPRESSION: 1. Stable multilevel cervical spondylosis and ossification of the posterior longitudinal ligament. 2. No acute cervical spine fracture. Electronically Signed   By: Sharlet Salina M.D.   On: 04/17/2020 19:16   DG Knee Complete 4 Views Right  Result Date:  04/17/2020 CLINICAL DATA:  Status post fall. EXAM: RIGHT KNEE - COMPLETE 4+ VIEW COMPARISON:  None. FINDINGS: No evidence of acute fracture or dislocation. Mild to moderate severity medial and lateral tibiofemoral compartment space narrowing is seen. Moderate severity patellofemoral narrowing is also noted. A small joint effusion is noted. IMPRESSION: Tri compartmental degenerative changes with a small joint effusion. Electronically Signed   By: Aram Candela M.D.   On: 04/17/2020 19:04   DG Hip Unilat W or Wo Pelvis 2-3 Views Right  Result Date: 04/17/2020 CLINICAL DATA:  Status post fall. EXAM: DG HIP (WITH OR WITHOUT PELVIS) 2-3V RIGHT COMPARISON:  None. FINDINGS: There is no evidence of hip fracture or dislocation. Mild degenerative changes seen in the form of joint space narrowing and acetabular sclerosis. Soft tissue structures are unremarkable. IMPRESSION: No acute osseous abnormality. Electronically Signed   By: Aram Candela M.D.   On: 04/17/2020 19:03    ____________________________________________   PROCEDURES  Procedure(s) performed (including Critical Care):  Procedures   ____________________________________________   INITIAL IMPRESSION / ASSESSMENT AND PLAN / ED COURSE  Randy Breit. was evaluated in Emergency Department on 04/17/2020 for the symptoms described in the history of present illness. He was evaluated in the context of the global COVID-19 pandemic, which necessitated consideration that the patient might be at risk for infection with the SARS-CoV-2 virus that causes COVID-19. Institutional protocols and algorithms that pertain to the evaluation of patients at risk for COVID-19 are in a state of rapid change based on information released by regulatory bodies including the CDC and federal and state organizations. These policies and algorithms were followed during the patient's care in the ED.    Patient is a 50 year old who has a history of epilepsy who comes  in for seizures.  Patient has not had 3 seizures.  Unclear if he went back to baseline in between.  This concerning for status epilepticus.  Patient is currently not seizing.  He is alert and oriented is a normal neuro exam.  However patient did hit his head so we will get CT scan to evaluate for intracranial hemorrhage, CT cervical evaluate for cervical fracture, labs to evaluate for Electra abnormalities, AKI.  Will get x-ray of his knee to evaluate for fracture.  I gave patient a dose of his IV Keppra   X-ray negative for fracture but does show effusion.  Discussed with patient that he could still have a ligament tear or meniscus tear that he continues to have issues he needs to follow-up with orthopedic surgery for potential MRI.  6:46 PM Discussed with  neurology Dr. Thomasena Edisollins who agrees with admission for observation to ensure no recurrent seizures as well get restarted on his onfi.   [facility] and EEG.  Says that Dr. Malvin JohnsPotter was planning to do EEG outpatient but had difficulty scheduling.  We will discuss possible team for admission       ____________________________________________   FINAL CLINICAL IMPRESSION(S) / ED DIAGNOSES   Final diagnoses:  Status epilepticus (HCC)      MEDICATIONS GIVEN DURING THIS VISIT:  Medications  levETIRAcetam (KEPPRA) IVPB 1500 mg/ 100 mL premix (has no administration in time range)  cloBAZam (ONFI) tablet 5 mg (5 mg Oral Given 04/17/20 1906)     ED Discharge Orders    None       Note:  This document was prepared using Dragon voice recognition software and may include unintentional dictation errors.   Randy SeFunke, Neveah Bang E, MD 04/17/20 2028

## 2020-04-17 NOTE — ED Notes (Signed)
ED Provider at bedside. 

## 2020-04-17 NOTE — Consult Note (Signed)
MEDICATION RELATED CONSULT NOTE  Pharmacy Consult for AED monitoring Indication: Epilepsy  Patient Measurements: Height: 6' (182.9 cm) Weight: (!) 183.7 kg (405 lb) IBW/kg (Calculated) : 77.6  Labs: Recent Labs    04/17/20 1812  WBC 5.0  HGB 13.5  HCT 40.6  PLT 106*  CREATININE 0.92   Estimated Creatinine Clearance: 164.9 mL/min (by C-G formula based on SCr of 0.92 mg/dL).   Medical History: Past Medical History:  Diagnosis Date  . Epilepsy (HCC)   . OSA on CPAP   . TBI (traumatic brain injury) (HCC)   . Thrombocytopenia (HCC)     Medications:  AED regimen (Per 11/2019 neurology visit) --Keppra 1500 mg BID --Topamax 200 mg BID --Trileptal 900 mg BID --Klonopin 1 mg HS  AED regimen (Per 03/2020 hospital admission) --Keppra 1500 mg BID --Topamax 200 mg BID --Trileptal 1200 mg BID (increased) --Onfi 5 mg HS (replaced Klonopin)  Assessment: Patient is a 50 y/o M with medical history including epilepsy. 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  Plan:  --No significant drug-drug interactions identified at this time --Continue to monitor   Tressie Ellis 04/17/2020,8:23 PM

## 2020-04-17 NOTE — H&P (Addendum)
History and Physical   Randy Cordova. NWG:[id number] DOB: 02-26-1970 DOA: 04/17/2020  PCP: Pcp, No  Outpatient Specialists: Dr. Malvin Johns, neurologist Patient coming from: home via EMS  I have personally briefly reviewed patient's old medical records in [facility] EMR.  Chief Concern: seizure  HPI: Randy Cordova. is a 50 y.o. male with medical history significant for obstructive sleep apnea on CPAP, morbid obesity, seizures, thrombocytopenia, presents to the emergency department for chief concerns of seizures.  Per patient, he had to seizures at home, then he called EMS.  Per RN note, with EMS he had one seizure that was described as grand mal seizures for 45 seconds, postictal for 2 minutes following.  Per note, EMS gave patient Versed IM 2 mg.  At bedside, [name] is able to tell me his name, age, location of hospital, and the current calendar year.  He endorses generalized muscle aches and right knee pain.  At bedside, he denies fever, chills, nausea, vomiting, shortness of breath, chest pain, abdominal pain, changes to bowel movements.  He reports that at baseline he has diarrhea.  Social history: lives by himself. His spouse is currently living [place], and he is planning to move to [place] once he has his seizure under control. 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 He currently does not use tobacco products, etoh, recreational drugs.   Vaccinations: two doses   ROS: Constitutional: no weight change, no fever ENT/Mouth: no sore throat, no rhinorrhea Eyes: no eye pain, no vision changes Cardiovascular: no chest pain, no dyspnea,  no edema, no palpitations Respiratory: no cough, no sputum, no wheezing Gastrointestinal: no nausea, no vomiting, no diarrhea, no constipation Genitourinary: no urinary incontinence, no dysuria, no hematuria Musculoskeletal: no arthralgias, no myalgias Skin: no skin lesions, no  pruritus, Neuro: + weakness, no loss of consciousness, no syncope Psych: no anxiety, no depression, no decrease appetite Heme/Lymph: no bruising, no bleeding  ED Course: Discussed with ED provider, patient requiring hospitalization for seizures.  Vitals in the ED was remarkable for temperature of 98.6, respiration rate of 15, heart rate of 92, blood pressure 118/60, satting at 97% on room air.  CT the head in the emergency department was read as no acute intracranial process, chronic left sphenoid sinus disease.  CT cervical spine without contrast was read as stable multilevel cervical spondylosis and ossification of the posterior longitudinal ligament.  No acute cervical spine fracture.  Right knee x-ray was read as tricompartmental degenerative changes with small joint effusions.  Right hip x-ray was read as no acute osseous abnormality.  ED provider gave patient's Keppra 1500 mg IVP once.  Assessment/Plan  Principal Problem:   Seizure [facility]) Active Problems:   Thrombocytopenia (HCC)   OSA on CPAP   # Seizures -Admit to MedSurg with telemetry -Seizures and fall precautions -Neurologist, Dr. Thomasena Edis has been consulted, appreciate recommendations -Resumed home antiseizure medications: Clobazam 5 mg nightly, Keppra 1500 p.o. twice daily, Trileptal 1200 mg p.o. twice daily, Topamax 200 mg p.o. twice daily -Ativan 1 to 2 milligrams, IV, every 2 hours as needed for seizures -Patient cannot drive until he is at least 6 months free of seizures  -Levetiracetam, topiramate levels ordered -EEG ordered per neurology to compare with EEG at [facility]  # OSA # Suspect hypoventilation syndrome-on CPAP, CPAP nightly resumed  # Right knee pain-Toradol 15 mg every 8 hours as needed for moderate pain -Nursing communication: Apply ice to the right knee  twice daily  # Thrombocytopenia-etiology is unclear at this time -Platelet is 106 at this time -Patient denies bleeding disorder -Outpatient  follow-up  # Morbid obesity-outpatient weight loss per PCP  AM labs: CBC in the a.m., BMP in a.m.  As needed medications: Acetaminophen, ondansetron, Toradol, lorazepam Chart reviewed.   DVT prophylaxis: Enoxaparin cutaneous every 24 hours Code Status: Full code Diet: Heart healthy Family Communication: No Disposition Plan: Pending clinical course Consults called: Neurology Admission status: Observation, MedSurg, telemetry  Past Medical History:  Diagnosis Date  . Epilepsy (HCC)   . OSA on CPAP   . TBI (traumatic brain injury) (HCC)   . Thrombocytopenia (HCC)    Past Surgical History:  Procedure Laterality Date  . BACK SURGERY    . CHOLECYSTECTOMY    . Head surgery     Due to brain injury  . KNEE SURGERY     Social History:  reports that he has never smoked. He has never used smokeless tobacco. He reports current alcohol use. He reports that he does not use drugs.  Allergies  Allergen Reactions  . Other Other (See Comments)    Uncoded Allergy. Allergen: ARTIFICIAL SWEETNERS  . Aspartame And Phenylalanine     Headache   Family History  Problem Relation Age of Onset  . Arthritis/Rheumatoid Mother    Family history: Family history reviewed and not pertinent  Prior to Admission medications   Medication Sig Start Date End Date Taking? Authorizing Provider  cloBAZam (ONFI) 10 MG tablet Take 0.5 tablets (5 mg total) by mouth at bedtime for 10 days. 03/20/20 03/30/20  Jerald Kiefhiu, Stephen K, MD  levETIRAcetam (KEPPRA) 1000 MG tablet Take 1,500 mg by mouth 2 (two) times daily.    [provider]  Oxcarbazepine (TRILEPTAL) 600 MG tablet Take 2 tablets (1,200 mg total) by mouth 2 (two) times daily. 03/20/20 04/19/20  Jerald Kiefhiu, Stephen K, MD  topiramate (TOPAMAX) 200 MG tablet Take 200 mg by mouth 2 (two) times daily.    [provider]   Physical Exam: Vitals:   04/17/20 1802 04/17/20 1804  BP:  (!) 103/51  Pulse: 97 93  Resp: 15 15  Temp: 98.6 F (37 C)    TempSrc: Oral   SpO2: 98% 97%  Weight:  (!) 183.7 kg  Height:  6' (1.829 m)   Constitutional: appears older than chronological age, NAD, calm, comfortable Eyes: PERRL, lids and conjunctivae normal ENMT: Mucous membranes are moist. Posterior pharynx clear of any exudate or lesions. Age-appropriate dentition. Hearing appropriate Neck: normal, supple, no masses, no thyromegaly Respiratory: clear to auscultation bilaterally, no wheezing, no crackles. Normal respiratory effort. No accessory muscle use.  Cardiovascular: Regular rate and rhythm, no murmurs / rubs / gallops. No extremity edema. 2+ pedal pulses. No carotid bruits.  Abdomen: Morbidly obese, no tenderness, no masses palpated, no hepatosplenomegaly. Bowel sounds positive.  Musculoskeletal: no clubbing / cyanosis. No joint deformity upper and lower extremities. no contractures, no atrophy. Normal muscle tone.  Right knee was not able to flex or extend without pain Skin: no rashes, lesions, ulcers. No induration Neurologic: Sensation intact. Strength 5/5 in all 4.  Psychiatric: Normal judgment and insight. Alert and oriented x 3. Normal mood.   EKG: independently reviewed, showing normal sinus rhythm with rate of 83, QTc 449  Chest x-ray on Admission: I personally reviewed and I agree with radiologist reading as below.  CT Head Wo Contrast  Result Date: 04/17/2020 CLINICAL DATA:  Multiple seizures, postictal EXAM: CT HEAD WITHOUT CONTRAST  TECHNIQUE: Contiguous axial images were obtained from the base of the skull through the vertex without intravenous contrast. COMPARISON:  03/19/2020 FINDINGS: Brain: No acute infarct or hemorrhage. Lateral ventricles and midline structures are unremarkable. No acute extra-axial fluid collections. No mass effect. Vascular: No hyperdense vessel or unexpected calcification. Skull: Normal. Negative for fracture or focal lesion. Sinuses/Orbits: Gas fluid levels within the left sphenoid sinus. Remaining  paranasal sinuses are clear. Other: None. IMPRESSION: 1. No acute intracranial process. 2. Chronic left sphenoid sinus disease. Electronically Signed   By: Sharlet Salina M.D.   On: 04/17/2020 19:13   CT Cervical Spine Wo Contrast  Result Date: 04/17/2020 CLINICAL DATA:  Seizures, neck trauma, postictal EXAM: CT CERVICAL SPINE WITHOUT CONTRAST TECHNIQUE: Multidetector CT imaging of the cervical spine was performed without intravenous contrast. Multiplanar CT image reconstructions were also generated. COMPARISON:  03/19/2020 FINDINGS: Alignment: Alignment is anatomic. Skull base and vertebrae: No acute fracture. No primary bone lesion or focal pathologic process. Soft tissues and spinal canal: No prevertebral fluid or swelling. No visible canal hematoma. 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 There is mild diffuse cervical spondylosis. Right predominant neural foraminal encroachment is seen at C3-4 and C4-5 due to uncovertebral hypertrophy. Upper chest: Airways patent.  Lung apices are clear. Other: Reconstructed images demonstrate no additional findings. IMPRESSION: 1. Stable multilevel cervical spondylosis and ossification of the posterior longitudinal ligament. 2. No acute cervical spine fracture. Electronically Signed   By: Sharlet Salina M.D.   On: 04/17/2020 19:16   DG Knee Complete 4 Views Right  Result Date: 04/17/2020 CLINICAL DATA:  Status post fall. EXAM: RIGHT KNEE - COMPLETE 4+ VIEW COMPARISON:  None. FINDINGS: No evidence of acute fracture or dislocation. Mild to moderate severity medial and lateral tibiofemoral compartment space narrowing is seen. Moderate severity patellofemoral narrowing is also noted. A small joint effusion is noted. IMPRESSION: Tri compartmental degenerative changes with a small joint effusion. Electronically Signed    By: Aram Candela M.D.   On: 04/17/2020 19:04   DG Hip Unilat W or Wo Pelvis 2-3 Views Right  Result Date: 04/17/2020 CLINICAL DATA:  Status post fall. EXAM: DG HIP (WITH OR WITHOUT PELVIS) 2-3V RIGHT COMPARISON:  None. FINDINGS: There is no evidence of hip fracture or dislocation. Mild degenerative changes seen in the form of joint space narrowing and acetabular sclerosis. Soft tissue structures are unremarkable. IMPRESSION: No acute osseous abnormality. Electronically Signed   By: Aram Candela M.D.   On: 04/17/2020 19:03   Labs on Admission: I have personally reviewed following labs  CBC: Recent Labs  Lab 04/17/20 1812  WBC 5.0  HGB 13.5  HCT 40.6  MCV 89.0  PLT 106*   Basic Metabolic Panel: Recent Labs  Lab 04/17/20 1812  NA 139  K 3.8  CL 112*  CO2 20*  GLUCOSE 111*  BUN 17  CREATININE 0.92  CALCIUM 8.7*   GFR: Estimated Creatinine Clearance: 164.9 mL/min (by C-G formula based on SCr of 0.92 mg/dL).  Urine analysis:    Component Value Date/Time   COLORURINE YELLOW (A) 03/19/2020 1817   APPEARANCEUR HAZY (A) 03/19/2020 1817   LABSPEC 1.024 03/19/2020 1817   PHURINE 5.0 03/19/2020 1817   GLUCOSEU NEGATIVE 03/19/2020 1817   HGBUR NEGATIVE 03/19/2020 1817   BILIRUBINUR NEGATIVE 03/19/2020 1817   KETONESUR NEGATIVE 03/19/2020 1817   PROTEINUR  NEGATIVE 03/19/2020 1817   NITRITE NEGATIVE 03/19/2020 1817   LEUKOCYTESUR NEGATIVE 03/19/2020 1817   Tammera Engert N Tiger Spieker D.O. [facility]  If 7PM-7AM, please contact overnight-coverage provider If 7AM-7PM, please contact day coverage provider [email or web address]  04/17/2020, 8:40 PM

## 2020-04-17 NOTE — Progress Notes (Signed)
PHARMACIST - PHYSICIAN COMMUNICATION  CONCERNING:  Enoxaparin (Lovenox) for DVT Prophylaxis    RECOMMENDATION: Patient was prescribed enoxaparin 40mg  q24 hours for VTE prophylaxis.   Filed Weights   04/17/20 1804  Weight: (!) 183.7 kg (405 lb)    Body mass index is 54.93 kg/m.  Estimated Creatinine Clearance: 164.9 mL/min (by C-G formula based on SCr of 0.92 mg/dL).   Based on [facility] policy patient is candidate for enoxaparin 0.5mg /kg TBW SQ every 24 hours based on BMI being >30.  DESCRIPTION: Pharmacy has adjusted enoxaparin dose per [facility] policy.  Patient is now receiving enoxaparin 92.5 mg every 24 hours    [facility] 04/17/2020 8:20 PM

## 2020-04-17 NOTE — ED Notes (Signed)
Admitting Provider at bedside. 

## 2020-04-18 ENCOUNTER — Encounter: Payer: Self-pay | Admitting: Internal Medicine

## 2020-04-18 DIAGNOSIS — S8991XA Unspecified injury of right lower leg, initial encounter: Secondary | ICD-10-CM | POA: Diagnosis present

## 2020-04-18 DIAGNOSIS — M1711 Unilateral primary osteoarthritis, right knee: Secondary | ICD-10-CM | POA: Diagnosis present

## 2020-04-18 DIAGNOSIS — Z9989 Dependence on other enabling machines and devices: Secondary | ICD-10-CM | POA: Diagnosis not present

## 2020-04-18 DIAGNOSIS — E872 Acidosis: Secondary | ICD-10-CM | POA: Diagnosis present

## 2020-04-18 DIAGNOSIS — Z9109 Other allergy status, other than to drugs and biological substances: Secondary | ICD-10-CM | POA: Diagnosis not present

## 2020-04-18 DIAGNOSIS — G40409 Other generalized epilepsy and epileptic syndromes, not intractable, without status epilepticus: Secondary | ICD-10-CM | POA: Diagnosis present

## 2020-04-18 DIAGNOSIS — M47812 Spondylosis without myelopathy or radiculopathy, cervical region: Secondary | ICD-10-CM | POA: Diagnosis present

## 2020-04-18 DIAGNOSIS — Z20822 Contact with and (suspected) exposure to covid-19: Secondary | ICD-10-CM | POA: Diagnosis present

## 2020-04-18 DIAGNOSIS — M25561 Pain in right knee: Secondary | ICD-10-CM | POA: Diagnosis present

## 2020-04-18 DIAGNOSIS — D696 Thrombocytopenia, unspecified: Secondary | ICD-10-CM | POA: Diagnosis present

## 2020-04-18 DIAGNOSIS — G40401 Other generalized epilepsy and epileptic syndromes, not intractable, with status epilepticus: Secondary | ICD-10-CM | POA: Diagnosis present

## 2020-04-18 DIAGNOSIS — R569 Unspecified convulsions: Secondary | ICD-10-CM | POA: Diagnosis not present

## 2020-04-18 DIAGNOSIS — G40901 Epilepsy, unspecified, not intractable, with status epilepticus: Secondary | ICD-10-CM | POA: Diagnosis present

## 2020-04-18 DIAGNOSIS — Z888 Allergy status to other drugs, medicaments and biological substances status: Secondary | ICD-10-CM | POA: Diagnosis not present

## 2020-04-18 DIAGNOSIS — E876 Hypokalemia: Secondary | ICD-10-CM | POA: Diagnosis present

## 2020-04-18 DIAGNOSIS — Z23 Encounter for immunization: Secondary | ICD-10-CM | POA: Diagnosis not present

## 2020-04-18 DIAGNOSIS — Z8261 Family history of arthritis: Secondary | ICD-10-CM | POA: Diagnosis not present

## 2020-04-18 DIAGNOSIS — Z87820 Personal history of traumatic brain injury: Secondary | ICD-10-CM | POA: Diagnosis not present

## 2020-04-18 DIAGNOSIS — X501XXA Overexertion from prolonged static or awkward postures, initial encounter: Secondary | ICD-10-CM | POA: Diagnosis not present

## 2020-04-18 DIAGNOSIS — G4733 Obstructive sleep apnea (adult) (pediatric): Secondary | ICD-10-CM | POA: Diagnosis present

## 2020-04-18 DIAGNOSIS — Z79899 Other long term (current) drug therapy: Secondary | ICD-10-CM | POA: Diagnosis not present

## 2020-04-18 DIAGNOSIS — W19XXXA Unspecified fall, initial encounter: Secondary | ICD-10-CM | POA: Diagnosis present

## 2020-04-18 DIAGNOSIS — Z6841 Body Mass Index (BMI) 40.0 and over, adult: Secondary | ICD-10-CM | POA: Diagnosis not present

## 2020-04-18 LAB — CBC
HCT: 40.4 % (ref 39.0–52.0)
Hemoglobin: 13.2 g/dL (ref 13.0–17.0)
MCH: 30.3 pg (ref 26.0–34.0)
MCHC: 32.7 g/dL (ref 30.0–36.0)
MCV: 92.7 fL (ref 80.0–100.0)
Platelets: 103 10*3/uL — ABNORMAL LOW (ref 150–400)
RBC: 4.36 MIL/uL (ref 4.22–5.81)
RDW: 14.6 % (ref 11.5–15.5)
WBC: 5.4 10*3/uL (ref 4.0–10.5)
nRBC: 0 % (ref 0.0–0.2)

## 2020-04-18 LAB — COMPREHENSIVE METABOLIC PANEL
ALT: 17 U/L (ref 0–44)
AST: 20 U/L (ref 15–41)
Albumin: 3.5 g/dL (ref 3.5–5.0)
Alkaline Phosphatase: 76 U/L (ref 38–126)
Anion gap: 7 (ref 5–15)
BUN: 18 mg/dL (ref 6–20)
CO2: 18 mmol/L — ABNORMAL LOW (ref 22–32)
Calcium: 8.5 mg/dL — ABNORMAL LOW (ref 8.9–10.3)
Chloride: 112 mmol/L — ABNORMAL HIGH (ref 98–111)
Creatinine, Ser: 0.89 mg/dL (ref 0.61–1.24)
GFR, Estimated: >60 mL/min (ref >60)
Glucose, Bld: 137 mg/dL — ABNORMAL HIGH (ref 70–99)
Potassium: 3.4 mmol/L — ABNORMAL LOW (ref 3.5–5.1)
Sodium: 137 mmol/L (ref 135–145)
Total Bilirubin: 0.7 mg/dL (ref 0.3–1.2)
Total Protein: 6.8 g/dL (ref 6.5–8.1)

## 2020-04-18 LAB — SARS CORONAVIRUS 2 (TAT 6-24 HRS): SARS Coronavirus 2: NEGATIVE

## 2020-04-18 MED ORDER — SODIUM CHLORIDE 0.9 % IV SOLN
2500.0000 mg | Freq: Once | INTRAVENOUS | Status: DC
  Filled 2020-04-18: qty 50

## 2020-04-18 MED ORDER — SODIUM BICARBONATE 650 MG PO TABS
650.0000 mg | ORAL_TABLET | Freq: Two times a day (BID) | ORAL | Status: DC
  Administered 2020-04-18 – 2020-04-23 (×11): 650 mg via ORAL
  Filled 2020-04-18 (×13): qty 1

## 2020-04-18 MED ORDER — SODIUM CHLORIDE 0.9 % IV SOLN
2000.0000 mg | Freq: Once | INTRAVENOUS | Status: AC
  Administered 2020-04-18: 2000 mg via INTRAVENOUS
  Filled 2020-04-18: qty 40

## 2020-04-18 MED ORDER — POTASSIUM CHLORIDE 10 MEQ/100ML IV SOLN
10.0000 meq | Freq: Once | INTRAVENOUS | Status: AC
  Administered 2020-04-18: 10 meq via INTRAVENOUS
  Filled 2020-04-18: qty 100

## 2020-04-18 NOTE — ED Notes (Signed)
Pt sitting at end of bed with meal tray on bedside table. Informed pt that hospital bed is ready but will need more help to move him to other bed. Pt currently alert and oriented times 4.

## 2020-04-18 NOTE — ED Notes (Signed)
This nurse at bedside replacing bp cuff and nasal cannula that were off -- pt noted to be resting with eyes closed; RR even and unlabored on RA however O2 desat to 90-91% on RA.  Pt bp cuff and nasal cannula replaced without pt arousing however pt LLE noted to be hanging off side of bed and when nurse went to reposition leg back onto bed pt began seizing -- this nurse called for Ativan to be pulled and additional assistance

## 2020-04-18 NOTE — Progress Notes (Signed)
PROGRESS NOTE    Randy Cordova.  BDZ:[id number] DOB: Mar 12, 1970 DOA: 04/17/2020 PCP: Pcp, No   Chief complaint.  Seizure. Brief Narrative:  Randy Cordova. is a 50 y.o. male with medical history significant for obstructive sleep apnea on CPAP, morbid obesity, seizures, thrombocytopenia, presents to the emergency department for chief concerns of seizures. Overnight, patient had additional 2 episodes of seizure.  He was loaded with Dilantin.  Neurology consult obtained.   Assessment & Plan:   Principal Problem:   Seizure Randy Cordova) Active Problems:   Thrombocytopenia (HCC)   OSA on CPAP  #1.  Grand mal seizure. Patient had multiple episodes of seizures in the hospital.  Currently loaded with Dilantin.  Continued home medicine.  Neurology consult is obtained, EEG is pending. Due to recurrent seizures, he will be kept in hospital for another day.  #2.  Obstructive sleep apnea. On CPAP.  3.  Thrombocytopenia. Likely medication related.  Recheck a CBC tomorrow.  4.  Morbid obesity.  5.  Mild metabolic acidosis. Start sodium bicarb orally.  6.  Hypokalemia. Supplement.    DVT prophylaxis: Lovenox Code Status: Full Family Communication:  Disposition Plan:  .   Status is: Observation    Dispo: The patient is from: Home              Anticipated d/c is to: Home              Patient currently is not medically stable to d/c.   Difficult to place patient No        No intake/output data recorded. Total I/O In: 100 [IV Piggyback:100] Out: -      Consultants:   Neurology.  Procedures: None  Antimicrobials: None  Subjective: Seizure this morning, was given Dilantin and Ativan.  Currently patient very sleepy and postictal. No short of breath or hypoxia. No abdominal pain or nausea vomiting.  Objective: Vitals:   04/18/20 0930 04/18/20 1030 04/18/20 1100 04/18/20 1130  BP: 125/72 116/61 108/63 (!) 102/53  Pulse: 85 80 81 77  Resp: 12 17 15 15   Temp:       TempSrc:      SpO2: 100% 98% 90% 96%  Weight:      Height:        Intake/Output Summary (Last 24 hours) at 04/18/2020 1247 Last data filed at 04/18/2020 1046 Gross per 24 hour  Intake 100 ml  Output -  Net 100 ml   Filed Weights   04/17/20 1804  Weight: (!) 183.7 kg    Examination:  General exam: Appears calm and comfortable  Respiratory system: Clear to auscultation. Respiratory effort normal. Cardiovascular system: S1 & S2 heard, RRR. No JVD, murmurs, rubs, gallops or clicks. No pedal edema. Gastrointestinal system: Abdomen is nondistended, soft and nontender. No organomegaly or masses felt. Normal bowel sounds heard. Central nervous system:-Drowsy and disorientated. No focal neurological deficits. Extremities: Symmetric 5 x 5 power. Skin: No rashes, lesions or ulcers      Data Reviewed: I have personally reviewed following labs and imaging studies  CBC: Recent Labs  Lab 04/17/20 1812 04/18/20 0458  WBC 5.0 5.4  HGB 13.5 13.2  HCT 40.6 40.4  MCV 89.0 92.7  PLT 106* 103*   Basic Metabolic Panel: Recent Labs  Lab 04/17/20 1812 04/18/20 0458  NA 139 137  K 3.8 3.4*  CL 112* 112*  CO2 20* 18*  GLUCOSE 111* 137*  BUN 17 18  CREATININE 0.92 0.89  CALCIUM 8.7* 8.5*  GFR: Estimated Creatinine Clearance: 170.4 mL/min (by C-G formula based on SCr of 0.89 mg/dL). Liver Function Tests: Recent Labs  Lab 04/18/20 0458  AST 20  ALT 17  ALKPHOS 76  BILITOT 0.7  PROT 6.8  ALBUMIN 3.5   No results for input(s): LIPASE, AMYLASE in the last 168 hours. No results for input(s): AMMONIA in the last 168 hours. Coagulation Profile: No results for input(s): INR, PROTIME in the last 168 hours. Cardiac Enzymes: No results for input(s): CKTOTAL, CKMB, CKMBINDEX, TROPONINI in the last 168 hours. BNP (last 3 results) No results for input(s): PROBNP in the last 8760 hours. HbA1C: No results for input(s): HGBA1C in the last 72 hours. CBG: No results for  input(s): GLUCAP in the last 168 hours. Lipid Profile: No results for input(s): CHOL, HDL, LDLCALC, TRIG, CHOLHDL, LDLDIRECT in the last 72 hours. Thyroid Function Tests: No results for input(s): TSH, T4TOTAL, FREET4, T3FREE, THYROIDAB in the last 72 hours. Anemia Panel: No results for input(s): VITAMINB12, FOLATE, FERRITIN, TIBC, IRON, RETICCTPCT in the last 72 hours. Sepsis Labs: No results for input(s): PROCALCITON, LATICACIDVEN in the last 168 hours.  Recent Results (from the past 240 hour(s))  SARS CORONAVIRUS 2 (TAT 6-24 HRS) Nasopharyngeal Nasopharyngeal Swab     Status: None   Collection Time: 04/17/20  8:20 PM   Specimen: Nasopharyngeal Swab  Result Value Ref Range Status   SARS Coronavirus 2 NEGATIVE NEGATIVE Final    Comment: (NOTE) SARS-CoV-2 target nucleic acids are NOT DETECTED.  The SARS-CoV-2 RNA is generally detectable in upper and lower respiratory specimens during the acute phase of infection. Negative results do not preclude SARS-CoV-2 infection, do not rule out co-infections with other pathogens, and should not be used as the sole basis for treatment or other patient management decisions. Negative results must be combined with clinical observations, patient history, and epidemiological information. The expected result is Negative.  Fact Sheet for Patients: HairSlick.no  Fact Sheet for Healthcare Providers: quierodirigir.com  This test is not yet approved or cleared by the [place] FDA and  has been authorized for detection and/or diagnosis of SARS-CoV-2 by FDA under an Emergency Use Authorization (EUA). This EUA will remain  in effect (meaning this test can be used) for the duration of the COVID-19 declaration under Se ction 564(b)(1) of the Act, 21 U.S.C. section 360bbb-3(b)(1), unless the authorization is terminated or revoked sooner.  Performed at [facility], [address]  [place], [place] [address]          Radiology Studies: CT Head Wo Contrast  Result Date: 04/17/2020 CLINICAL DATA:  Multiple seizures, postictal EXAM: CT HEAD WITHOUT CONTRAST TECHNIQUE: Contiguous axial images were obtained from the base of the skull through the vertex without intravenous contrast. COMPARISON:  03/19/2020 FINDINGS: Brain: No acute infarct or hemorrhage. Lateral ventricles and midline structures are unremarkable. No acute extra-axial fluid collections. No mass effect. Vascular: No hyperdense vessel or unexpected calcification. Skull: Normal. Negative for fracture or focal lesion. Sinuses/Orbits: Gas fluid levels within the left sphenoid sinus. Remaining paranasal sinuses are clear. Other: None. IMPRESSION: 1. No acute intracranial process. 2. Chronic left sphenoid sinus disease. Electronically Signed   By: Sharlet Salina M.D.   On: 04/17/2020 19:13   CT Cervical Spine Wo Contrast  Result Date: 04/17/2020 CLINICAL DATA:  Seizures, neck trauma, postictal EXAM: CT CERVICAL SPINE WITHOUT CONTRAST TECHNIQUE: Multidetector CT imaging of the cervical spine was performed without intravenous contrast. Multiplanar CT image reconstructions were also generated. COMPARISON:  03/19/2020 FINDINGS: Alignment: Alignment is anatomic. Skull base and vertebrae: No acute fracture. No primary bone lesion or focal pathologic process. Soft tissues and spinal canal: No prevertebral fluid or swelling. No visible canal hematoma. 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 There is mild diffuse cervical spondylosis. Right predominant neural foraminal encroachment is seen at C3-4 and C4-5 due to uncovertebral hypertrophy. Upper chest: Airways patent.  Lung apices are clear. Other: Reconstructed images demonstrate no additional findings. IMPRESSION: 1. Stable multilevel  cervical spondylosis and ossification of the posterior longitudinal ligament. 2. No acute cervical spine fracture. Electronically Signed   By: Sharlet Salina M.D.   On: 04/17/2020 19:16   DG Knee Complete 4 Views Right  Result Date: 04/17/2020 CLINICAL DATA:  Status post fall. EXAM: RIGHT KNEE - COMPLETE 4+ VIEW COMPARISON:  None. FINDINGS: No evidence of acute fracture or dislocation. Mild to moderate severity medial and lateral tibiofemoral compartment space narrowing is seen. Moderate severity patellofemoral narrowing is also noted. A small joint effusion is noted. IMPRESSION: Tri compartmental degenerative changes with a small joint effusion. Electronically Signed   By: Aram Candela M.D.   On: 04/17/2020 19:04   DG Hip Unilat W or Wo Pelvis 2-3 Views Right  Result Date: 04/17/2020 CLINICAL DATA:  Status post fall. EXAM: DG HIP (WITH OR WITHOUT PELVIS) 2-3V RIGHT COMPARISON:  None. FINDINGS: There is no evidence of hip fracture or dislocation. Mild degenerative changes seen in the form of joint space narrowing and acetabular sclerosis. Soft tissue structures are unremarkable. IMPRESSION: No acute osseous abnormality. Electronically Signed   By: Aram Candela M.D.   On: 04/17/2020 19:03        Scheduled Meds: . cloBAZam  5 mg Oral QHS  . enoxaparin (LOVENOX) injection  0.5 mg/kg Subcutaneous Q24H  . levETIRAcetam  1,500 mg Oral BID  . oxcarbazepine  1,200 mg Oral BID  . sodium bicarbonate  650 mg Oral BID  . topiramate  200 mg Oral BID   Continuous Infusions: . sodium chloride 75 mL/hr (04/18/20 0856)     LOS: 0 days    Time spent: 28 minutes    Marrion Coy, MD [facility]   To contact the attending provider between 7A-7P or the covering provider during after hours 7P-7A, please log into the web site [email or web address] and access using universal [place] password for that web site. If you do not have the password, please call the hospital operator.  04/18/2020,  12:47 PM

## 2020-04-18 NOTE — ED Notes (Signed)
Called pharmacy to send missing scheduled anti seizure meds that are not loaded in sufficient quantity in pyxis.

## 2020-04-18 NOTE — Progress Notes (Signed)
[employer] with seizure activity over a couple minutes that was abated with ativan.  He was loaded with dilantin. Had additional seizure of short duration again abated with ativan.  Prior discussion with patient revealed he has tendency to have increased seizure actiivity with pain and with sleep deprivation.  He admitted he has not slept all night and very uncomfortable on stretcher. Hospital bed was ordered.   EEG already ordered on admission.  Will have neuro follow up

## 2020-04-18 NOTE — ED Notes (Signed)
2mg  IVP Ativan subsequently administered after recent seizure activity.   Pt now post-ictal -- BUE rigidity noted.  Dr (ED provider) has notified on-call admitting provider B Don Perking, NP-C; per admitting provider pt is to receive oral a.m. seizure meds as soon as he is alert enough.  Will continue to monitor for acute changes and maintain plan of care-- cardiac and pulse ox monitoring maintained.

## 2020-04-18 NOTE — Plan of Care (Signed)
POC initiated 

## 2020-04-18 NOTE — ED Notes (Signed)
Spoke with wife of pt on phone. Gave update.

## 2020-04-18 NOTE — ED Notes (Signed)
Pt moved into hospital bed. Pt requested to have nasal cannula off for awhile. SPO2 remains above 92% on RA when pt is awake.

## 2020-04-18 NOTE — ED Notes (Signed)
Pt sitting up in bed - reports back discomfort due to lying on stretcher and R knee pain --prn Toradol administered.  Pt requesting to eat and has been provided an express meal sandwich.  Denies any additional needs at this time.

## 2020-04-18 NOTE — ED Notes (Signed)
Pt found to be having tonic clonic seizure. PRN Ativan given, 2mg . This nurse and other nurse will bring pt to room 123. Pt 100% SPO2 on RA. Pt alert but disoriented at this time. Speech clear.

## 2020-04-18 NOTE — ED Notes (Signed)
EEG tech at bedside. Room where pt being admitted to still being cleaned. EED tech going to proceed with EEG at this time.

## 2020-04-18 NOTE — ED Notes (Signed)
Patient states he will notify his wife on his cellphone

## 2020-04-18 NOTE — Procedures (Signed)
Pt goimg to 123 Went to ED but pt leaving for 123 Will get from room

## 2020-04-18 NOTE — ED Notes (Signed)
Room 123 marked as ready now. EEG tech prefers to do EEG upstairs. Aecretary calling transport to bring pt to room.

## 2020-04-18 NOTE — ED Provider Notes (Signed)
Patient admitted for several hours awaiting bed in the ED, started seizing again. Generalized tonic clonic seizure lasting 30s. Patient given 2mg  of IV ativan. Seizure stopped. Hospitalist team informed for any further management.   _________________________ 5:20 AM on 04/18/2020 -----------------------------------------  Hospitalist NP 04/20/2020 at bedside evaluating patient. Dilantin ordered.   _________________________ 6:35 AM on 04/18/2020 -----------------------------------------  Patient again had another 30s generalized tonic clonic seizure. Received another 2mg  of ativan. At this point, patient has received Keppra, Topamax, Trileptal, and Dilantin on top of several rounds of Ativan. Hospitalist team informed again of clinical condition.       04/20/2020, MD 04/18/20 [phone]

## 2020-04-18 NOTE — ED Notes (Signed)
Pt awake and alert at this time sitting up in bed; no acute distress noted-- very talkative but pleasant.  Seizure precautions maintained (padded side rails and suction in place).  Continuous cardiac monitoring and pulse ox monitoring maintained.

## 2020-04-18 NOTE — ED Notes (Signed)
Pt has been sleeping and snoring. Pt arousable to voice. Pt now awake and talking to this nurse.

## 2020-04-18 NOTE — ED Notes (Signed)
Unit secretary observed pt HR on cardiac monitoring to increase to 200s -- when staff nurses entered room to assess pt was convulsing and beating his chest with his arms -- Dr Don Perking to bedside to assist.  Charge nurse pulled 2mg  IVP Ativan - unk how long seizure lasted.  Pt now awake and speaking -- clear speech.  Breathing however labored --  RR 26.  Current vitals 140/60/91/26/100% on RA

## 2020-04-19 ENCOUNTER — Other Ambulatory Visit: Payer: Self-pay

## 2020-04-19 ENCOUNTER — Encounter: Payer: Self-pay | Admitting: Internal Medicine

## 2020-04-19 DIAGNOSIS — Z87820 Personal history of traumatic brain injury: Secondary | ICD-10-CM

## 2020-04-19 DIAGNOSIS — R569 Unspecified convulsions: Secondary | ICD-10-CM

## 2020-04-19 DIAGNOSIS — G40901 Epilepsy, unspecified, not intractable, with status epilepticus: Secondary | ICD-10-CM

## 2020-04-19 LAB — CBC WITH DIFFERENTIAL/PLATELET
Abs Immature Granulocytes: 0.01 10*3/uL (ref 0.00–0.07)
Basophils Absolute: 0 10*3/uL (ref 0.0–0.1)
Basophils Relative: 0 %
Eosinophils Absolute: 0.1 10*3/uL (ref 0.0–0.5)
Eosinophils Relative: 2 %
HCT: 39.9 % (ref 39.0–52.0)
Hemoglobin: 13.3 g/dL (ref 13.0–17.0)
Immature Granulocytes: 0 %
Lymphocytes Relative: 46 %
Lymphs Abs: 2 10*3/uL (ref 0.7–4.0)
MCH: 30 pg (ref 26.0–34.0)
MCHC: 33.3 g/dL (ref 30.0–36.0)
MCV: 89.9 fL (ref 80.0–100.0)
Monocytes Absolute: 0.3 10*3/uL (ref 0.1–1.0)
Monocytes Relative: 6 %
Neutro Abs: 2.1 10*3/uL (ref 1.7–7.7)
Neutrophils Relative %: 46 %
Platelets: 98 10*3/uL — ABNORMAL LOW (ref 150–400)
RBC: 4.44 MIL/uL (ref 4.22–5.81)
RDW: 14.5 % (ref 11.5–15.5)
WBC: 4.4 10*3/uL (ref 4.0–10.5)
nRBC: 0 % (ref 0.0–0.2)

## 2020-04-19 LAB — BASIC METABOLIC PANEL
Anion gap: 5 (ref 5–15)
BUN: 15 mg/dL (ref 6–20)
CO2: 23 mmol/L (ref 22–32)
Calcium: 8.5 mg/dL — ABNORMAL LOW (ref 8.9–10.3)
Chloride: 112 mmol/L — ABNORMAL HIGH (ref 98–111)
Creatinine, Ser: 0.89 mg/dL (ref 0.61–1.24)
GFR, Estimated: >60 mL/min (ref >60)
Glucose, Bld: 106 mg/dL — ABNORMAL HIGH (ref 70–99)
Potassium: 3.7 mmol/L (ref 3.5–5.1)
Sodium: 140 mmol/L (ref 135–145)

## 2020-04-19 LAB — TOPIRAMATE LEVEL: Topiramate Lvl: 7 ug/mL (ref 2.0–25.0)

## 2020-04-19 LAB — MAGNESIUM: Magnesium: 2.1 mg/dL (ref 1.7–2.4)

## 2020-04-19 MED ORDER — OXYCODONE HCL 5 MG PO TABS
5.0000 mg | ORAL_TABLET | Freq: Four times a day (QID) | ORAL | Status: DC | PRN
  Administered 2020-04-19 – 2020-04-22 (×4): 5 mg via ORAL
  Filled 2020-04-19 (×4): qty 1

## 2020-04-19 MED ORDER — INFLUENZA VAC A&B SA ADJ QUAD 0.5 ML IM PRSY
0.5000 mL | PREFILLED_SYRINGE | INTRAMUSCULAR | Status: AC
  Administered 2020-04-19: 0.5 mL via INTRAMUSCULAR
  Filled 2020-04-19: qty 0.5

## 2020-04-19 MED ORDER — NAPROXEN 250 MG PO TABS
250.0000 mg | ORAL_TABLET | Freq: Two times a day (BID) | ORAL | Status: DC
  Administered 2020-04-19 – 2020-04-20 (×3): 250 mg via ORAL
  Filled 2020-04-19 (×4): qty 1

## 2020-04-19 NOTE — Progress Notes (Signed)
[facility] PROGRESS NOTE    Randy Cordova.  BMW:[id number] DOB: 1970-06-25 DOA: 04/17/2020 PCP: Pcp, No      Brief Narrative:  [name] is a 50 y.o. M with epilepsy, hx TBI, MO, obesity, and OSA who presented with a seizure cluster and knee pain.  Patient reports frequent seizures, up to twice per week.  This week, had a cluster of more frequent seizures, of greater intensity, including one that led to knee pain (somehow injured the knee during a seizure with subsequent swelling), so he came to the ER.  In the ER, patient had 2 witnessed seizures overnight.  Loaded with Dilantin.         Assessment & Plan:  Uncontrolled epilepsy with breakthrough seizures Patient admitted and continued on home meds.  CT head normal.    Had 2 witnessed seizures in ER, loaded with Dilantin.   Subsequently yesterday afternoon and this morning, patient had 2 unwitnessed siezures, without post-ictal state.  EEG normal.  -Consult Neurology -Continue Onfi, Trileptal, Lamictal -Continue Keppra, increase dose   Thrombocytopenia Stable, no change  OSA -Continue CPAP at night  Knee pain Mechanical injury during seizures.  No effusion on exam, although limited by habitus.  X-ray negative for fracture but shows degenerative disease and small effusion.  -Rest, ice compression -NSAIDs -PT       Disposition: Status is: Inpatient  Remains inpatient appropriate because:ongoing seizures   Dispo: The patient is from: Home              Anticipated d/c is to: Home              Patient currently is not medically stable to d/c.   Difficult to place patient No       Level of care: Med-Surg       MDM: The below labs and imaging reports were reviewed and summarized above.  Medication management as above.   DVT prophylaxis: Place TED hose Start: 04/17/20 2017 Lovenox  Code Status: FULL Family Communication:     Consultants:    Neurology  Procedures:   3/16 EEG -- normal        Subjective: Another seizure this morning.  No postictal state.  No headache or feve.r  Reports swelling in right knee. Continued right knee pain.  Objective: Vitals:   04/19/20 0015 04/19/20 0427 04/19/20 0805 04/19/20 1128  BP: 133/78 135/75 140/83 132/77  Pulse: 80 75 81 84  Resp: 17 17 18 20   Temp: 98.2 F (36.8 C) (!) 97.4 F (36.3 C) (!) 97.4 F (36.3 C) (!) 97.5 F (36.4 C)  TempSrc: Oral Oral Oral   SpO2: 98% 99% 100% 96%  Weight:      Height:        Intake/Output Summary (Last 24 hours) at 04/19/2020 1525 Last data filed at 04/19/2020 1520 Gross per 24 hour  Intake 600 ml  Output 1300 ml  Net -700 ml   Filed Weights   04/17/20 1804 04/18/20 1547  Weight: (!) 183.7 kg (!) 209 kg    Examination: General appearance: obeses adult male, alert and in no acute distress.   HEENT: Anicteric, conjunctiva pink, lids and lashes normal. No nasal deformity, discharge, epistaxis.  Lips moist.   Skin: Warm and dry.  no jaundice.  No suspicious rashes or lesions on right leg or knee. Cardiac: RRR, nl S1-S2, no murmurs appreciated.  Capillary refill is brisk.  JVP not visible.  No LE edema.  Radial pulses  2+ and symmetric. Respiratory: Normal respiratory rate and rhythm.  CTAB without rales or wheezes. Abdomen: Abdomen soft.  no TTP. No ascites, distension, hepatosplenomegaly.   MSK: No deformities or effusions.  No effusion of right knee.  The right knee is limited in ROM by pain. Neuro: Awake and alert.  EOMI, moves all extremities. Speech fluent.    Psych: Sensorium intact and responding to questions, attention normal. Affect normal.  Judgment and insight appear normal.    Data Reviewed: I have personally reviewed following labs and imaging studies:  CBC: Recent Labs  Lab 04/17/20 1812 04/18/20 0458 04/19/20 0516  WBC 5.0 5.4 4.4  NEUTROABS  --   --  2.1  HGB 13.5 13.2 13.3  HCT 40.6 40.4 39.9  MCV 89.0  92.7 89.9  PLT 106* 103* 98*   Basic Metabolic Panel: Recent Labs  Lab 04/17/20 1812 04/18/20 0458 04/19/20 0516  NA 139 137 140  K 3.8 3.4* 3.7  CL 112* 112* 112*  CO2 20* 18* 23  GLUCOSE 111* 137* 106*  BUN 17 18 15   CREATININE 0.92 0.89 0.89  CALCIUM 8.7* 8.5* 8.5*  MG  --   --  2.1   GFR: Estimated Creatinine Clearance: 184.9 mL/min (by C-G formula based on SCr of 0.89 mg/dL). Liver Function Tests: Recent Labs  Lab 04/18/20 0458  AST 20  ALT 17  ALKPHOS 76  BILITOT 0.7  PROT 6.8  ALBUMIN 3.5   No results for input(s): LIPASE, AMYLASE in the last 168 hours. No results for input(s): AMMONIA in the last 168 hours. Coagulation Profile: No results for input(s): INR, PROTIME in the last 168 hours. Cardiac Enzymes: No results for input(s): CKTOTAL, CKMB, CKMBINDEX, TROPONINI in the last 168 hours. BNP (last 3 results) No results for input(s): PROBNP in the last 8760 hours. HbA1C: No results for input(s): HGBA1C in the last 72 hours. CBG: No results for input(s): GLUCAP in the last 168 hours. Lipid Profile: No results for input(s): CHOL, HDL, LDLCALC, TRIG, CHOLHDL, LDLDIRECT in the last 72 hours. Thyroid Function Tests: No results for input(s): TSH, T4TOTAL, FREET4, T3FREE, THYROIDAB in the last 72 hours. Anemia Panel: No results for input(s): VITAMINB12, FOLATE, FERRITIN, TIBC, IRON, RETICCTPCT in the last 72 hours. Urine analysis:    Component Value Date/Time   COLORURINE YELLOW (A) 03/19/2020 1817   APPEARANCEUR HAZY (A) 03/19/2020 1817   LABSPEC 1.024 03/19/2020 1817   PHURINE 5.0 03/19/2020 1817   GLUCOSEU NEGATIVE 03/19/2020 1817   HGBUR NEGATIVE 03/19/2020 1817   BILIRUBINUR NEGATIVE 03/19/2020 1817   KETONESUR NEGATIVE 03/19/2020 1817   PROTEINUR NEGATIVE 03/19/2020 1817   NITRITE NEGATIVE 03/19/2020 1817   LEUKOCYTESUR NEGATIVE 03/19/2020 1817   Sepsis Labs: @LABRCNTIP (procalcitonin:4,lacticacidven:4)  ) Recent Results (from the past 240  hour(s))  SARS CORONAVIRUS 2 (TAT 6-24 HRS) Nasopharyngeal Nasopharyngeal Swab     Status: None   Collection Time: 04/17/20  8:20 PM   Specimen: Nasopharyngeal Swab  Result Value Ref Range Status   SARS Coronavirus 2 NEGATIVE NEGATIVE Final    Comment: (NOTE) SARS-CoV-2 target nucleic acids are NOT DETECTED.  The SARS-CoV-2 RNA is generally detectable in upper and lower respiratory specimens during the acute phase of infection. Negative results do not preclude SARS-CoV-2 infection, do not rule out co-infections with other pathogens, and should not be used as the sole basis for treatment or other patient management decisions. Negative results must be combined with clinical observations, patient history, and epidemiological information. The expected result is Negative.  Fact Sheet for Patients: HairSlick.nohttps://www.fda.gov/media/138098/download  Fact Sheet for Healthcare Providers: quierodirigir.comhttps://www.fda.gov/media/138095/download  This test is not yet approved or cleared by the [place] FDA and  has been authorized for detection and/or diagnosis of SARS-CoV-2 by FDA under an Emergency Use Authorization (EUA). This EUA will remain  in effect (meaning this test can be used) for the duration of the COVID-19 declaration under Se ction 564(b)(1) of the Act, 21 U.S.C. section 360bbb-3(b)(1), unless the authorization is terminated or revoked sooner.  Performed at [facility], [address] [place], [place] [id number]          Radiology Studies: CT Head Wo Contrast  Result Date: 04/17/2020 CLINICAL DATA:  Multiple seizures, postictal EXAM: CT HEAD WITHOUT CONTRAST TECHNIQUE: Contiguous axial images were obtained from the base of the skull through the vertex without intravenous contrast. COMPARISON:  03/19/2020 FINDINGS: Brain: No acute infarct or hemorrhage. Lateral ventricles and midline structures are unremarkable. No acute extra-axial fluid collections. No mass effect. Vascular: No  hyperdense vessel or unexpected calcification. Skull: Normal. Negative for fracture or focal lesion. Sinuses/Orbits: Gas fluid levels within the left sphenoid sinus. Remaining paranasal sinuses are clear. Other: None. IMPRESSION: 1. No acute intracranial process. 2. Chronic left sphenoid sinus disease. Electronically Signed   By: Sharlet SalinaMichael  Brown M.D.   On: 04/17/2020 19:13   CT Cervical Spine Wo Contrast  Result Date: 04/17/2020 CLINICAL DATA:  Seizures, neck trauma, postictal EXAM: CT CERVICAL SPINE WITHOUT CONTRAST TECHNIQUE: Multidetector CT imaging of the cervical spine was performed without intravenous contrast. Multiplanar CT image reconstructions were also generated. COMPARISON:  03/19/2020 FINDINGS: Alignment: Alignment is anatomic. Skull base and vertebrae: No acute fracture. No primary bone lesion or focal pathologic process. Soft tissues and spinal canal: No prevertebral fluid or swelling. No visible canal hematoma. 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 There is mild diffuse cervical spondylosis. Right predominant neural foraminal encroachment is seen at C3-4 and C4-5 due to uncovertebral hypertrophy. Upper chest: Airways patent.  Lung apices are clear. Other: Reconstructed images demonstrate no additional findings. IMPRESSION: 1. Stable multilevel cervical spondylosis and ossification of the posterior longitudinal ligament. 2. No acute cervical spine fracture. Electronically Signed   By: Sharlet SalinaMichael  Brown M.D.   On: 04/17/2020 19:16   DG Knee Complete 4 Views Right  Result Date: 04/17/2020 CLINICAL DATA:  Status post fall. EXAM: RIGHT KNEE - COMPLETE 4+ VIEW COMPARISON:  None. FINDINGS: No evidence of acute fracture or dislocation. Mild to moderate severity medial and lateral tibiofemoral compartment space narrowing is seen. Moderate severity  patellofemoral narrowing is also noted. A small joint effusion is noted. IMPRESSION: Tri compartmental degenerative changes with a small joint effusion. Electronically Signed   By: Aram Candelahaddeus  Houston M.D.   On: 04/17/2020 19:04   EEG adult  Result Date: 04/19/2020 Charlsie QuestYadav, Priyanka O, MD     04/19/2020  2:56 PM Patient Name: Clair GullingDanny Fritchman Jr. MRN: [id number] Epilepsy Attending: Charlsie QuestPriyanka O Yadav Referring Physician/Provider: Dr Londell MohAmy Cox Date: 04/19/2020 Duration: 26.49 mins Patient history: 50 year old male with multiple seizures.  EEG evaluate for seizures. Level of alertness: Awake, asleep AEDs during EEG study: Clobazam, Keppra, oxcarbazepine, topiramate Technical aspects: This EEG study was done with scalp electrodes positioned according to the 10-20 International system of electrode placement. Electrical activity was acquired at a sampling rate of 500Hz  and reviewed with a high  frequency filter of 70Hz  and a low frequency filter of 1Hz . EEG data were recorded continuously and digitally stored. Description: The posterior dominant rhythm consists of 9 Hz activity of moderate voltage (25-35 uV) seen predominantly in posterior head regions, symmetric and reactive to eye opening and eye closing. Sleep was characterized by vertex waves, sleep spindles (12 to 14 Hz), maximal frontocentral region. There is an excessive amount of 15 to 18 Hz beta activity  distributed symmetrically and diffusely. Physiologic photic driving was not seen during photic stimulation. Hyperventilation was not performed.   ABNORMALITY - Excessive beta, generalized IMPRESSION: This study is within normal limits. No seizures or epileptiform discharges were seen throughout the recording. The excessive beta activity seen in the background is most likely due to the effect of benzodiazepine and is a benign EEG pattern.   DG Hip Unilat W or Wo Pelvis 2-3 Views Right  Result Date: 04/17/2020 CLINICAL DATA:  Status post fall. EXAM: DG  HIP (WITH OR WITHOUT PELVIS) 2-3V RIGHT COMPARISON:  None. FINDINGS: There is no evidence of hip fracture or dislocation. Mild degenerative changes seen in the form of joint space narrowing and acetabular sclerosis. Soft tissue structures are unremarkable. IMPRESSION: No acute osseous abnormality. Electronically Signed   By: Charlsie Quest M.D.   On: 04/17/2020 19:03        Scheduled Meds: . cloBAZam  5 mg Oral QHS  . enoxaparin (LOVENOX) injection  0.5 mg/kg Subcutaneous Q24H  . [START ON 04/20/2020] influenza vaccine adjuvanted  0.5 mL Intramuscular Tomorrow-1000  . levETIRAcetam  1,500 mg Oral BID  . naproxen  250 mg Oral BID WC  . oxcarbazepine  1,200 mg Oral BID  . sodium bicarbonate  650 mg Oral BID  . topiramate  200 mg Oral BID   Continuous Infusions:   LOS: 1 day    Time spent: 25 minutes    04/19/2020, MD [facility] 04/19/2020, 3:25 PM     Please page though AMION or Epic secure chat:  For Alberteen Sam, 04/21/2020

## 2020-04-19 NOTE — Progress Notes (Signed)
Unwitnessed seizure this shift, 2mg  ativan given with improvement, hospitalist notified and  aware, no injury noted, patient alert and oriented, will continue to monitor.

## 2020-04-19 NOTE — Evaluation (Addendum)
Physical Therapy Evaluation Patient Details Name: Randy Cordova. MRN: [id number] DOB: 14-Jun-1970 Today's Date: 04/19/2020   History of Present Illness  Randy Cordova. is a 50 y.o. male who comes to [facility] on 3/15 after acute on chornic right knee injury sustained during breakthrough seizure event at home. At baseline pt has 2-3 seizures/week. Pt recent relocated to their area from [place], now followed by [facility] neurology. Rt kne eimaging reveal mild effusion and chronic DJD. EEG study here unremarkable. Pt seen by neurology while admitted. PMH: OSA on CPAP, morbid obesity, multiple remote concussions c refratory epilepsy x 2 years on 4 ASM.    Clinical Impression  Pt admitted with above diagnosis. Pt currently with functional limitations due to the deficits listed below (see PT Problem List). Pt found inclined in bed agreeable to PT services. Vitals normal prior to mobility. Noted decreased sensation to LT on plantar surface of R foot but normal sensation throughout in LE's. UE strength WFL. LE strength limited to testing due to increased R knee pain reported at 10/10 but displayed ability to bend R knee in bed with pain and limited motion. Increased knee pain with R ankle DF. With Mod-I use of bed rails and elevated HOB pt able to sit EOB with no LOB noted. Mod-I mobility with STS with use of bariatric RW and able to ambulate with Minguard for safety due to reports of dizziness during ambulation. Pt reports pt ambulating at decreased speed compared to PLOF. Total distance of 70' ambulated with pt returned to bed with Mod-I use of railings. Vitals post amb: HR: 97 BPM, SPO2: 99% and labored breathing. Current level of function is reduced to PLOF ~2 weeks ago per patient reports being able to walk his dog > 1 mile as displayed with reports of dizziness and decreased walking distance and new need of bariatric RW for support due to decreased balance and significant reports of R knee pain. Pt will benefit  from skilled PT to increase their independence and safety with mobility to allow discharge to the venue listed below.       Follow Up Recommendations SNF    Equipment Recommendations  Other (comment) (Bariatric RW)    Recommendations for Other Services       Precautions / Restrictions Precautions Precautions: Fall Precaution Comments: Seizure activity Required Braces or Orthoses: Other Brace Other Brace: Wearing soft R knee brace upon entry.      Mobility  Bed Mobility Overal bed mobility: Modified Independent             General bed mobility comments: Required use of bed rail to go from inclined to seated EOB.    Transfers Overall transfer level: Modified independent Equipment used:  (Bariatric RW)                Ambulation/Gait Ambulation/Gait assistance: Min guard (minguard for safety due to hx of recent seizure activity and reports of dizziness with walking.) Gait Distance (Feet): 70 Feet Assistive device:  (bariatric RW) Gait Pattern/deviations: Decreased step length - right;Decreased step length - left;Decreased stride length     General Gait Details: Decreased gait velocity  Stairs            Wheelchair Mobility    Modified Rankin (Stroke Patients Only)       Balance Overall balance assessment: Needs assistance Sitting-balance support: Bilateral upper extremity supported (with bariatric RW) Sitting balance-Leahy Scale: Good Sitting balance - Comments: Able to sit EOB and mobilize equipment in roomw with  no LOB   Standing balance support: Bilateral upper extremity supported Standing balance-Leahy Scale: Poor Standing balance comment: Required bariatric RW for support                             Pertinent Vitals/Pain Pain Assessment: 0-10 Pain Score: 10-Worst pain ever Pain Location: R knee Pain Descriptors / Indicators: Aching Pain Intervention(s): Limited activity within patient's tolerance;Monitored during session     Home Living Family/patient expects to be discharged to:: Private residence Living Arrangements: Non-relatives/Friends (Best friend "Shawn"; Works 9-5p M-F, provides transportation.)   Type of Home: House Home Access: Stairs to enter Entrance Stairs-Rails: Psychiatric nurse of Steps: [place]: Two level;Able to live on main level with bedroom/bathroom;Bed/bath upstairs;Full bath on main level Home Equipment: Cane - single point      Prior Function Level of Independence: Independent with assistive device(s)   Gait / Transfers Assistance Needed: AMB mostly household distances due to chornic persistent knee pain; recent acquisition of SPC, not using  ADL's / Homemaking Assistance Needed: typically independent with ADL;        Hand Dominance        Extremity/Trunk Assessment                Communication      Cognition Arousal/Alertness: Awake/alert Behavior During Therapy: WFL for tasks assessed/performed Overall Cognitive Status: Within Functional Limits for tasks assessed                                        General Comments      Exercises     Assessment/Plan    PT Assessment Patient needs continued PT services  PT Problem List Decreased strength;Obesity;Decreased activity tolerance;Decreased safety awareness;Decreased balance;Pain;Decreased mobility;Impaired sensation       PT Treatment Interventions DME instruction;Balance training;Gait training;Neuromuscular re-education;Stair training;Functional mobility training;Patient/family education;Therapeutic activities;Therapeutic exercise    PT Goals (Current goals can be found in the Care Plan section)  Acute Rehab PT Goals Patient Stated Goal: Return home. Be with wife in [place] once seizures under control PT Goal Formulation: With patient Time For Goal Achievement: 05/03/20 Potential to Achieve Goals: Fair    Frequency Min 2X/week   Barriers to discharge    Friend that lives with patient at his home but works M-F 9-5 job. Bedroom on second story and 25 stairs to reach bedroom.    Co-evaluation               AM-PAC PT "6 Clicks" Mobility  Outcome Measure Help needed turning from your back to your side while in a flat bed without using bedrails?: A Little Help needed moving from lying on your back to sitting on the side of a flat bed without using bedrails?: A Little Help needed moving to and from a bed to a chair (including a wheelchair)?: A Little Help needed standing up from a chair using your arms (e.g., wheelchair or bedside chair)?: A Little Help needed to walk in hospital room?: A Little Help needed climbing 3-5 steps with a railing? : Total 6 Click Score: 16    End of Session   Activity Tolerance: Patient limited by fatigue;Patient limited by pain Patient left: in bed;with call bell/phone within reach;with bed alarm set Nurse Communication: Mobility status PT Visit Diagnosis: Unsteadiness on feet (R26.81);Difficulty in walking, not elsewhere classified (R26.2);Other abnormalities  of gait and mobility (R26.89);Muscle weakness (generalized) (M62.81);History of falling (Z91.81);Pain Pain - Right/Left: Right Pain - part of body: Knee    Time: 9678-9381 PT Time Calculation (min) (ACUTE ONLY): 30 min   Charges:   PT Evaluation $PT Eval Moderate Complexity: 1 Mod PT Treatments $Therapeutic Exercise: 8-22 mins       Salem Caster Fairly, IV PT, DPT 04/19/2020, 5:25 PM

## 2020-04-19 NOTE — Consult Note (Addendum)
Neurology Consult H&P  [name]. MR# [id number] 04/19/2020  CC: breakthrough seizure  History is obtained from: Patient and chart  HPI: Randy Cordova. is a 50 y.o. 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 He also stated that he was on the phone with a friend who told him he had a seizure because of the grunting/gurggling noises.  First seizure on 04/2018 and subsequently diagnosed with epilepsy. He had a complete workup at [facility] and is now followed at [facility] He was previously seen at [facility] 03/19/2020 and was started on clobazam 29m nightly with improved frequency to 2-3 times per week but on discharge did not have enough and was not able to get a refill through his primary neurologist.   He stated that he has had the best control with combination of LEV, OXC, TMP, CZP (made him very drowsy and was discontinued).   He denies missing ASM doses, intercurrent illness, stress, lack of sleep. Uses CPAP nightly as well as during naps.  Semiology: No warning. Loses consciousness immediately however friends tell him he starts speaking "gibberish" the has behavioral arrest which is followed by hyper motor activity arms > legs which lasts ~45 seconds then starts to grunt, snort and gurgle. Postictal confusion and aphasia for about 10 minutes then feels tired. No tongue biting, incontinence. He has no recollection. Frequency 2-3 times per week - Day > night.  Current ASMs: LEV 15040mbid OXC 120051mid TPM 200m57md CLB 5mg 19m  Previous ASMs: - LCM stopped due to dizziness aggression and cost - Clonazepam had effect but "knocked him out." - VPA increased seizure frequency  Triggers: Stress, lack of sleep, pain.  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 Premature ~29 weeks No history of meningitis or encephalitis No family history of seizures or migraines except for a cousin also with multiple concussions.  Prior work-up: He is followed [facility] and last note 11/25/2019: Long term EEG 11/10/2018 showed the presence of an active ictal focus over the left anterior temporal region and captured 3 focal seizures. MRI brain 2019: Reported as normal other than some mild nonspecific T2 hyperintensities. He [facility] but records are not available.    Also complains of blurry vision, decreased distance vision and restriction in his visual fields. Has generalized muscle aches, right knee pain and wears knee support. Denies fever, chills, nausea, vomiting, shortness of breath, chest pain, abdominal pain, changes to bladder function.    ROS: A complete ROS was performed and is negative except as noted in the HPI.   Past Medical History:  Diagnosis Date  . Epilepsy (HCC) [place] OSA on CPAP   . TBI (traumatic brain injury) (HCC) [place] Thrombocytopenia (HCC) [place]  [place] Relation Age of Onset  . Arthritis/Rheumatoid Mother    Social History:  reports that he has never smoked. He has never used smokeless tobacco. He reports current alcohol use. He reports that he does not use drugs.  Prior to Admission medications   Medication Sig Start Date End Date Taking? Authorizing Provider  cloBAZam (ONFI) 10 MG tablet Take 0.5 tablets (5 mg total) by mouth at  bedtime for 10 days. 03/20/20 03/30/20  Donne Hazel, MD  levETIRAcetam (KEPPRA) 1000 MG tablet Take 1,500 mg by mouth 2 (two) times daily.    [provider]  Oxcarbazepine (TRILEPTAL) 600 MG tablet Take 2 tablets (1,200 mg total) by mouth 2 (two) times daily. 03/20/20 04/19/20  Donne Hazel, MD  topiramate (TOPAMAX) 200 MG tablet Take 200 mg by mouth 2 (two) times daily.     [provider]    Exam: Current vital signs: BP 132/77 (BP Location: Right Arm)   Pulse 84   Temp (!) 97.5 F (36.4 C)   Resp 20   Ht 6' (1.829 m)   Wt (!) 209 kg   SpO2 96%   BMI 62.49 kg/m   Physical Exam  Constitutional: Appears well-developed and well-nourished.  Psych: Affect appropriate to situation Eyes: No scleral injection HENT: No OP obstruction. Head: Normocephalic.  Cardiovascular: Normal rate and regular rhythm.  Respiratory: Effort normal, symmetric excursions bilaterally, no audible wheezing. GI: Soft.  No distension. There is no tenderness.  Skin: WDI  Neuro: Mental Status: Patient is awake, alert, oriented to person, place, month, year, and situation. Patient is able to give a clear and coherent history. Speech fluent, intact comprehension and repetition. No signs of aphasia or neglect. Visual Fields are full. Pupils are equal, round, and reactive to light. EOMI without ptosis or diploplia.  Facial sensation is symmetric to temperature Facial movement is symmetric.  Hearing is intact to voice. Uvula midline and palate elevates symmetrically. Shoulder shrug is symmetric. Tongue is midline without atrophy or fasciculations.  Tone is normal. Bulk is normal. 5/5 strength was present in all four extremities. Sensation is symmetric to light touch and temperature in the arms and legs. Deep Tendon Reflexes: 2+ and symmetric in the biceps and patellae. Toes are downgoing bilaterally. FNF and HKS are intact bilaterally. Gait - Deferred  I have reviewed labs in epic and the pertinent results are:   Ref. Range 03/19/2020 10:26  Levetiracetam, S Latest Ref Range: 10.0 - 40.0 ug/mL 22.9    Ref. Range 03/19/2020 10:36  Topiramate Lvl Latest Ref Range: 2.0 - 25.0 ug/mL 7.0   I have reviewed the images obtained: NCT head showed no acute infarct or hemorrhage. Lateral ventricles and midline structures are unremarkable. No acute extra-axial  fluid collections. No mass effect.  Assessment: Randy Cordova. is a 50 y.o. 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 Previous ASM levels indicate he is adherent with medications. We discussed a temporizing plan to get him to his outpatient follow up appointment to which he agreed.  His plan is to wean off TPM because he has developed restricted visual fields and blurry vision and myopia.  Semiology suggests temporal lobe epilepsy however not enough concordant data to be conclusive.  Impression:  Epilepsy. Breakthrough seizure.  Plan: - Routine EEG - Reviewed which was within range of normal without epileptiform discharge. - Top-off dose of LEV 1,062m as he had in-hospital seizure. - Increase levetiracetam to 2,0086mtwo times daily. - Continue clobazam 6m58mightly he will need enough to get him to the end of April 2022 when he has outpatient neurology follow up. - Continue oxcarbazepine 1,200m22mo times daily. - Vitamin B6 150-200mg41mry morning. - MRI outpatient specifically 3T MRI with epilepsy protocol. -  Follow up with outpatient neurology as planned.  Reviewed seizure precautions: Per [facility] statutes, patients with seizures are not allowed to drive until they have been seizure-free for six months and cleared by a physician   Use caution when using heavy equipment or power tools. Avoid working on ladders or at heights. Take showers instead of baths. Ensure the water temperature is not too high on the home water heater. Do not go swimming alone. Do not lock yourself in a room alone (i.e. bathroom). When caring for infants or small children, sit down when holding, feeding, or changing them to minimize risk of injury to the child in the event you have a seizure. Maintain good  sleep hygiene. Avoid alcohol.    Electronically signed by:  Lynnae Sandhoff, MD Page: [phone] 04/19/2020, 11:46 AM

## 2020-04-19 NOTE — TOC Initial Note (Signed)
Transition of Care (TOC) - Initial/Assessment Note    Patient Details  Name: Randy Cordova. MRN: [id number] Date of Birth: 09-Oct-1970  Transition of Care [facility]) CM/SW Contact:    Su Hilt, RN Phone Number: 04/19/2020, 3:50 PM  Clinical Narrative:                 Met with the patient to discuss DC plan and needs The patient lives at home with his Best friend who also provides transportation and any needed care The patient does have steps to get in and out of the home but states that he has no problem navigating the steps He does not have any DME but states that he does not need any CM called [facility] and they are taking new Medicaid patient's the patient will need to have this medical group added to his Medicaid card prior to them being able to make an appointment He is aware and will call Medicaid and then call [facility] for an appointment for PCP He will be seen by PT to determine needs and agrees to go to Outpatient PT if recommended Will continue to Monitor for needs  Expected Discharge Plan: (P) Home/Self Care Barriers to Discharge: (P) Continued Medical Work up   Patient Goals and CMS Choice Patient states their goals for this hospitalization and ongoing recovery are:: (P) get knee better      Expected Discharge Plan and Services Expected Discharge Plan: (P) Home/Self Care       Living arrangements for the past 2 months: (P) Single Family Home                                      Prior Living Arrangements/Services Living arrangements for the past 2 months: (P) Single Family Home Lives with:: (P) Soil scientist                   Activities of Daily Living Home Assistive Devices/Equipment: CPAP,Shower chair with back,Eyeglasses,Cane (specify quad or straight) ADL Screening (condition at time of admission) Patient's cognitive ability adequate to safely complete daily activities?: Yes Is the patient deaf or have difficulty  hearing?: No Does the patient have difficulty seeing, even when wearing glasses/contacts?: No Does the patient have difficulty concentrating, remembering, or making decisions?: No Patient able to express need for assistance with ADLs?: Yes Does the patient have difficulty dressing or bathing?: No Independently performs ADLs?: No Communication: Independent Dressing (OT): Needs assistance Is this a change from baseline?: Change from baseline, expected to last <3days Grooming: Needs assistance Is this a change from baseline?: Change from baseline, expected to last <3 days Feeding: Independent Bathing: Needs assistance Is this a change from baseline?: Change from baseline, expected to last <3 days Toileting: Needs assistance Is this a change from baseline?: Change from baseline, expected to last <3 days In/Out Bed: Needs assistance Is this a change from baseline?: Change from baseline, expected to last <3 days Walks in Home: Needs assistance Is this a change from baseline?: Change from baseline, expected to last <3 days Does the patient have difficulty walking or climbing stairs?: Yes Weakness of Legs: Both Weakness of Arms/Hands: None  Permission Sought/Granted                  Emotional Assessment              Admission diagnosis:  Status epilepticus ([place]) [N40.768]  Seizure ([place]) [R56.9] [place] mal seizure [facility]) [E15.973] Patient Active Problem List   Diagnosis Date Noted  . Grand mal seizure ([place]) 04/18/2020  . Seizure ([place]) 03/19/2020  . Thrombocytopenia ([place])   . OSA on CPAP    PCP:  Pcp, No Pharmacy:   [facility] [place] [address] Phone: [phone] Fax: [phone]     Social Determinants of Health (SDOH) Interventions    Readmission Risk Interventions No flowsheet data found.

## 2020-04-19 NOTE — Progress Notes (Signed)
Dr Maryfrances Bunnell made aware that prior to assessment and morning meds pt states that "I had a really bad seizure like 10 minutes ago".  Pt didn't appear to be in any distress, A&O, neuro check WDL, pt with on complaints, MD acknowledged, no new orders

## 2020-04-19 NOTE — Consult Note (Signed)
MEDICATION RELATED CONSULT NOTE  Pharmacy Consult for AED monitoring Indication: Epilepsy  Patient Measurements: Height: 6' (182.9 cm) Weight: (!) 209 kg (460 lb 12.2 oz) IBW/kg (Calculated) : 77.6  Labs: Recent Labs    04/17/20 1812 04/18/20 0458 04/19/20 0516  WBC 5.0 5.4 4.4  HGB 13.5 13.2 13.3  HCT 40.6 40.4 39.9  PLT 106* 103* 98*  CREATININE 0.92 0.89 0.89  MG  --   --  2.1  ALBUMIN  --  3.5  --   PROT  --  6.8  --   AST  --  20  --   ALT  --  17  --   ALKPHOS  --  76  --   BILITOT  --  0.7  --    Estimated Creatinine Clearance: 184.9 mL/min (by C-G formula based on SCr of 0.89 mg/dL).   Medical History: Past Medical History:  Diagnosis Date  . Epilepsy (HCC)   . OSA on CPAP   . TBI (traumatic brain injury) (HCC)   . Thrombocytopenia (HCC)     Medications:  AED regimen (Per 11/2019 neurology visit) --Keppra 1500 mg BID --Topamax 200 mg BID --Trileptal 900 mg BID --Klonopin 1 mg HS  AED regimen (Per 03/2020 hospital admission) --Keppra 1500 mg BID --Topamax 200 mg BID --Trileptal(oxcarbazepine) 1200 mg BID (increased) --Onfi (clobazam) 5 mg HS (replaced Klonopin)  Assessment: Patient is a 50 y/o M with medical history including epilepsy. 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  Plan:  --No significant drug-drug interactions identified at this time --Continue to monitor   Tushar Enns A 04/19/2020,2:05 PM

## 2020-04-19 NOTE — Procedures (Signed)
Patient Name: Randy Cordova.  MRN: [id number]  Epilepsy Attending: Charlsie Quest  Referring Physician/Provider: Dr Londell Moh Date: 04/19/2020 Duration: 26.49 mins  Patient history: 50 year old male with multiple seizures.  EEG evaluate for seizures.  Level of alertness: Awake, asleep  AEDs during EEG study: Clobazam, Keppra, oxcarbazepine, topiramate  Technical aspects: This EEG study was done with scalp electrodes positioned according to the 10-20 International system of electrode placement. Electrical activity was acquired at a sampling rate of 500Hz  and reviewed with a high frequency filter of 70Hz  and a low frequency filter of 1Hz . EEG data were recorded continuously and digitally stored.   Description: The posterior dominant rhythm consists of 9 Hz activity of moderate voltage (25-35 uV) seen predominantly in posterior head regions, symmetric and reactive to eye opening and eye closing. Sleep was characterized by vertex waves, sleep spindles (12 to 14 Hz), maximal frontocentral region. There is an excessive amount of 15 to 18 Hz beta activity  distributed symmetrically and diffusely. Physiologic photic driving was not seen during photic stimulation. Hyperventilation was not performed.     ABNORMALITY - Excessive beta, generalized  IMPRESSION: This study is within normal limits. No seizures or epileptiform discharges were seen throughout the recording. The excessive beta activity seen in the background is most likely due to the effect of benzodiazepine and is a benign EEG pattern.   Bruno Leach 

## 2020-04-19 NOTE — Progress Notes (Signed)
eeg done °

## 2020-04-20 MED ORDER — VITAMIN B-6 50 MG PO TABS
200.0000 mg | ORAL_TABLET | Freq: Every day | ORAL | Status: DC
  Administered 2020-04-21 – 2020-04-23 (×3): 200 mg via ORAL
  Filled 2020-04-20 (×3): qty 4

## 2020-04-20 MED ORDER — LEVETIRACETAM 500 MG PO TABS
500.0000 mg | ORAL_TABLET | Freq: Once | ORAL | Status: AC
  Administered 2020-04-20: 13:00:00 500 mg via ORAL
  Filled 2020-04-20: qty 1

## 2020-04-20 MED ORDER — LEVETIRACETAM 750 MG PO TABS
2000.0000 mg | ORAL_TABLET | Freq: Two times a day (BID) | ORAL | Status: DC
  Administered 2020-04-20 – 2020-04-23 (×6): 2000 mg via ORAL
  Filled 2020-04-20 (×7): qty 1

## 2020-04-20 MED ORDER — NAPROXEN 375 MG PO TABS
375.0000 mg | ORAL_TABLET | Freq: Three times a day (TID) | ORAL | Status: DC
Start: 2020-04-21 — End: 2020-04-23
  Administered 2020-04-21 – 2020-04-23 (×7): 375 mg via ORAL
  Filled 2020-04-20 (×9): qty 1

## 2020-04-20 NOTE — Progress Notes (Signed)
Patient had used call bell to notify this writer that he experienced a seizure, no staff witnessed. Stated he "woke up from it". Patient Neuro check complete and normal, VS WNL, prn ativan given per MD order and MD [place] notified. Patient denies pain or SOB, refuses to get back in bed, wants to remain sitting in recliner. Also requests food and drink. Will continue to monitor through remainder of this writer's scheduled shift.

## 2020-04-20 NOTE — Plan of Care (Signed)
Continue w POC. 

## 2020-04-20 NOTE — Progress Notes (Addendum)
Physical Therapy Treatment Patient Details Name: Randy Cordova. MRN: [id number] DOB: 01/22/1971 Today's Date: 04/20/2020    History of Present Illness Jervis Vernal Hritz. is a 50 y.o. male with epilepsy who comes in for seizures.  Patient had 3 seizures. Reports R knee pain at 10/10 due to falling from seizures and landing on R knee and L knee pain due to compensation.    PT Comments    Pt in bed upon entry, quite red in face, somewhat delayed in response, reports he feels "like [expletive], really foggy"- later confirms his cognition is somewhat delayed. Author asks if could be related to recently received ativan, his response without confidence or insight. Pt reports he feels like this from time-to-time in the mornings. Knee pain largely unchanged, mostly bothersome when attempting to flex the knee past 75 degrees. Pt reports recently getting back in bed using BRW to go from recliner to BR, then to Rt side of bed- pt jokingly asks author to not inform his RN of this, but Thereasa Parkin provides education again and pleading for pt to call for supervision prior to mobilizing in room- pt expresses his minimal buy-in.   Pt moves to EOB at modI level, then EOB to recliner at supervision level with BRW, no cues needed, appears safety minded in his motor patterns. Pt transported to 1A rehab gym for training staircase. Pt extensively educated multiple times for plan for safe performance, centered around seizure precautions, large habitus, and elevated Rt knee pain. Pt impulsively attempts sequential steps, requires repeated cues to cease. Chartered loss adjuster later explains rationale for single step training only after patient inquiry, then pt impulsively performs his last two stairs in sequence. Pt able to perform stairs slowly with a step-to gait, 1st set with 2 handrails, then a 2nd set with only 1 hand rail (practices with both), and is even able to utilize the RLE for powering up a step twice.   Pt has exertional DOE,  increased RR into 30s c steps, SpO2 100%, HR 82 bpm. Pt returned to room, assisted back to bed with supervision. Pt impulsively trying to arrange the room, move furniture where he wants it, while using RW with one hand, does not appear especially safe, asks author to return bed to center of room prior to return to EOB despite leaving him less space to navigate in a typical fashion with RW. No seizure like activity. Pt continues to have acute weakness, but does not offer any specific degree of impairment. Will continue with AMB practice next session, stairs again if energy permits. Pt progressing well toward goals in general.    Follow Up Recommendations  Home health PT;Supervision - Intermittent     Equipment Recommendations  Other (comment) Boykin Nearing RW (already in room now))    Recommendations for Other Services       Precautions / Restrictions Precautions Precautions: Fall Precaution Comments: Seizure precautions Required Braces or Orthoses: Other Brace Other Brace: Wearing soft R knee brace upon entry. Restrictions Weight Bearing Restrictions: No    Mobility  Bed Mobility Overal bed mobility: Modified Independent                  Transfers Overall transfer level: Needs assistance Equipment used: Rolling walker (2 wheeled);None Transfers: Sit to/from [employer] Sit to Stand: Supervision            Ambulation/Gait Ambulation/Gait assistance:  (minimally in session, in room only; focus is on stairs today)  Stairs Stairs: Yes Stairs assistance: Min guard Stair Management: Two rails;One rail Right;One rail Left;Step to pattern;Forwards Number of Stairs: 6 (6 steps, then 6 steps after rest) General stair comments: 1 step at a time for safety given seizure precautions and large habitus; 6x1 c 2 rails Left leading; then 4x1, 2x1 c single UE, and 2x leading RLE, 4x leading LLE; does 2 steps in a row despite extensive conversation and  being asked to not do this.   Wheelchair Mobility    Modified Rankin (Stroke Patients Only)       Balance Overall balance assessment: Modified Independent;History of Falls                                          Cognition Arousal/Alertness: Awake/alert Behavior During Therapy: WFL for tasks assessed/performed;Flat affect;Impulsive Overall Cognitive Status: Within Functional Limits for tasks assessed                                        Exercises      General Comments        Pertinent Vitals/Pain Pain Assessment: Faces Faces Pain Scale: Hurts even more (10/10with end range flexion for be dmobility.) Pain Location: R knee    Home Living                      Prior Function            PT Goals (current goals can now be found in the care plan section) Acute Rehab PT Goals Patient Stated Goal: Return home. Be with wife in [place] once seizures under control PT Goal Formulation: With patient Time For Goal Achievement: 05/03/20 Potential to Achieve Goals: Good Progress towards PT goals: Progressing toward goals    Frequency    Min 2X/week (will attempt daily treatment as able/tolerated)      PT Plan Discharge plan needs to be updated;Frequency needs to be updated    Co-evaluation              AM-PAC PT "6 Clicks" Mobility   Outcome Measure  Help needed turning from your back to your side while in a flat bed without using bedrails?: None Help needed moving from lying on your back to sitting on the side of a flat bed without using bedrails?: None Help needed moving to and from a bed to a chair (including a wheelchair)?: None Help needed standing up from a chair using your arms (e.g., wheelchair or bedside chair)?: None Help needed to walk in hospital room?: A Little Help needed climbing 3-5 steps with a railing? : A Little 6 Click Score: 22    End of Session   Activity Tolerance: Patient limited by  fatigue;Patient tolerated treatment well Patient left: in bed;with call bell/phone within reach;with bed alarm set Nurse Communication: Mobility status;Precautions PT Visit Diagnosis: Unsteadiness on feet (R26.81);Difficulty in walking, not elsewhere classified (R26.2);Other abnormalities of gait and mobility (R26.89);Muscle weakness (generalized) (M62.81);History of falling (Z91.81);Pain Pain - Right/Left: Right Pain - part of body: Knee     Time: 2703-5009 PT Time Calculation (min) (ACUTE ONLY): 27 min  Charges:  $Gait Training: 23-37 mins                     5:14 PM, 04/20/20  Rosamaria Lints, PT, DPT Physical Therapist - [facility]  [phone] (ASCOM)     Celestine Prim C 04/20/2020, 5:02 PM

## 2020-04-20 NOTE — Progress Notes (Signed)
[place] [facility] PROGRESS NOTE    Randy Cordova.  GDJ:[id number] DOB: Aug 13, 1970 DOA: 04/17/2020 PCP: Pcp, No      Brief Narrative:  Randy Cordova is a 50 y.o. M with epilepsy, hx TBI, MO, obesity, and OSA who presented with a seizure cluster and knee pain.  Patient reports frequent seizures, up to twice per week.  This week, had a cluster of more frequent seizures, of greater intensity, including one that led to knee pain (somehow injured the knee during a seizure with subsequent swelling), so he came to the ER.  In the ER, patient had 2 witnessed seizures overnight.  Loaded with Dilantin.         Assessment & Plan:  Uncontrolled epilepsy with breakthrough seizures Patient admitted and continued on home meds.  CT head normal.    Had 2 witnessed seizures in ER, loaded with Dilantin. EEG normal.  Again today he had an unwitnessed seizure.   -Consult Neurology -Continue Onfi, Trileptal, Lamictal -Continue Keppra, Neurology recommended increasing dose today, this was inadvertently delayed until this morning -Continue B6    Thrombocytopenia Stable, no change  OSA -Continue CPAP at night  Degenerative joint disease, right knee Mechanical injury during seizures.  No effusion on exam, although limited by habitus.  X-ray negative for fracture but shows degenerative disease and small effusion.  -Rest, ice compression -Increase NSAID dose, which is helping -PT       Disposition: Status is: Inpatient  Remains inpatient appropriate because:ongoing seizures   Dispo: The patient is from: Home              Anticipated d/c is to: Home              Patient currently is not medically stable to d/c.   Difficult to place patient No    Patient presented with uncontrolled seizures, also his primary complaint was knee pain.  Here, the knee pain is degenerative in character, requires longterm PT, NSAIDs and outpatient follow up.  But it precludes walking  and he will need further rehab here, before he can return home, plus he still is having ongoing seizures, and may need transfer to [facility] for cEEG.   Level of care: Med-Surg       MDM: The below labs and imaging reports were reviewed and summarized above.  Medication management as above.   DVT prophylaxis: Place TED hose Start: 04/17/20 2017 Lovenox  Code Status: FULL Family Communication:     Consultants:   Neurology  Procedures:   3/16 EEG -- normal        Subjective: Another seizure this morning.  No fever, headache, malaise.  He has a "aura" that he woke up with this morning.  He is on his phone.  He still has right knee pain.        Objective: Vitals:   04/20/20 1000 04/20/20 1008 04/20/20 1207 04/20/20 1559  BP:  (!) 154/97 (!) 146/73 (!) 119/100  Pulse: 94 89 88 84  Resp: 20  18 20   Temp:  98.2 F (36.8 C) (!) 97.2 F (36.2 C) 97.7 F (36.5 C)  TempSrc:  Oral Oral Oral  SpO2: 96% 95% 95% 97%  Weight:      Height:        Intake/Output Summary (Last 24 hours) at 04/20/2020 1654 Last data filed at 04/20/2020 1405 Gross per 24 hour  Intake 720 ml  Output --  Net 720 ml   Filed Weights   04/17/20  1804 04/18/20 1547  Weight: (!) 183.7 kg (!) 209 kg    Examination: General appearance: Obese adult male, sitting up in recliner, no acute distress     HEENT: Anicteric, conjunctival pink, lids and lashes normal.  No nasal deformity, discharge, or epistaxis. Skin: No suspicious rashes or lesions Cardiac: RRR, no murmurs, no lower extremity edema, some chronic venostasis Respiratory: Normal respiratory rate and rhythm, lungs clear without rales or wheezes Abdomen: Abdomen soft without tenderness palpation or guarding MSK: Normal muscle bulk and tone, do I do not appreciate effusion of the right knee.  Range of motion is limited and he has tenderness with range of motion, although this appears improved from yesterday Neuro: Awake and alert,  extraocular movements intact, moves all extremities with normal strength and coordination, limited by right knee pain severely Psych: Attention normal, affect normal, judgment insight appear normal    Data Reviewed: I have personally reviewed following labs and imaging studies:  CBC: Recent Labs  Lab 04/17/20 1812 04/18/20 0458 04/19/20 0516  WBC 5.0 5.4 4.4  NEUTROABS  --   --  2.1  HGB 13.5 13.2 13.3  HCT 40.6 40.4 39.9  MCV 89.0 92.7 89.9  PLT 106* 103* 98*   Basic Metabolic Panel: Recent Labs  Lab 04/17/20 1812 04/18/20 0458 04/19/20 0516  NA 139 137 140  K 3.8 3.4* 3.7  CL 112* 112* 112*  CO2 20* 18* 23  GLUCOSE 111* 137* 106*  BUN 17 18 15   CREATININE 0.92 0.89 0.89  CALCIUM 8.7* 8.5* 8.5*  MG  --   --  2.1   GFR: Estimated Creatinine Clearance: 184.9 mL/min (by C-G formula based on SCr of 0.89 mg/dL). Liver Function Tests: Recent Labs  Lab 04/18/20 0458  AST 20  ALT 17  ALKPHOS 76  BILITOT 0.7  PROT 6.8  ALBUMIN 3.5   No results for input(s): LIPASE, AMYLASE in the last 168 hours. No results for input(s): AMMONIA in the last 168 hours. Coagulation Profile: No results for input(s): INR, PROTIME in the last 168 hours. Cardiac Enzymes: No results for input(s): CKTOTAL, CKMB, CKMBINDEX, TROPONINI in the last 168 hours. BNP (last 3 results) No results for input(s): PROBNP in the last 8760 hours. HbA1C: No results for input(s): HGBA1C in the last 72 hours. CBG: No results for input(s): GLUCAP in the last 168 hours. Lipid Profile: No results for input(s): CHOL, HDL, LDLCALC, TRIG, CHOLHDL, LDLDIRECT in the last 72 hours. Thyroid Function Tests: No results for input(s): TSH, T4TOTAL, FREET4, T3FREE, THYROIDAB in the last 72 hours. Anemia Panel: No results for input(s): VITAMINB12, FOLATE, FERRITIN, TIBC, IRON, RETICCTPCT in the last 72 hours. Urine analysis:    Component Value Date/Time   COLORURINE YELLOW (A) 03/19/2020 1817   APPEARANCEUR HAZY  (A) 03/19/2020 1817   LABSPEC 1.024 03/19/2020 1817   PHURINE 5.0 03/19/2020 1817   GLUCOSEU NEGATIVE 03/19/2020 1817   HGBUR NEGATIVE 03/19/2020 1817   BILIRUBINUR NEGATIVE 03/19/2020 1817   KETONESUR NEGATIVE 03/19/2020 1817   PROTEINUR NEGATIVE 03/19/2020 1817   NITRITE NEGATIVE 03/19/2020 1817   LEUKOCYTESUR NEGATIVE 03/19/2020 1817   Sepsis Labs: @LABRCNTIP (procalcitonin:4,lacticacidven:4)  ) Recent Results (from the past 240 hour(s))  SARS CORONAVIRUS 2 (TAT 6-24 HRS) Nasopharyngeal Nasopharyngeal Swab     Status: None   Collection Time: 04/17/20  8:20 PM   Specimen: Nasopharyngeal Swab  Result Value Ref Range Status   SARS Coronavirus 2 NEGATIVE NEGATIVE Final    Comment: (NOTE) SARS-CoV-2 target nucleic acids are  NOT DETECTED.  The SARS-CoV-2 RNA is generally detectable in upper and lower respiratory specimens during the acute phase of infection. Negative results do not preclude SARS-CoV-2 infection, do not rule out co-infections with other pathogens, and should not be used as the sole basis for treatment or other patient management decisions. Negative results must be combined with clinical observations, patient history, and epidemiological information. The expected result is Negative.  Fact Sheet for Patients: HairSlick.no  Fact Sheet for Healthcare Providers: quierodirigir.com  This test is not yet approved or cleared by the [place] FDA and  has been authorized for detection and/or diagnosis of SARS-CoV-2 by FDA under an Emergency Use Authorization (EUA). This EUA will remain  in effect (meaning this test can be used) for the duration of the COVID-19 declaration under Se ction 564(b)(1) of the Act, 21 U.S.C. section 360bbb-3(b)(1), unless the authorization is terminated or revoked sooner.  Performed at [facility], [address] [place], [place] [address]          Radiology  Studies: EEG adult  Result Date: 2020-05-13 Charlsie Quest, MD     05-13-20  2:56 PM Patient Name: Rinaldo Macqueen. MRN: [id number] Epilepsy Attending: Charlsie Quest Referring Physician/Provider: Dr Londell Moh Date: 13-May-2020 Duration: 26.49 mins Patient history: 50 year old male with multiple seizures.  EEG evaluate for seizures. Level of alertness: Awake, asleep AEDs during EEG study: Clobazam, Keppra, oxcarbazepine, topiramate Technical aspects: This EEG study was done with scalp electrodes positioned according to the 10-20 International system of electrode placement. Electrical activity was acquired at a sampling rate of 500Hz  and reviewed with a high frequency filter of 70Hz  and a low frequency filter of 1Hz . EEG data were recorded continuously and digitally stored. Description: The posterior dominant rhythm consists of 9 Hz activity of moderate voltage (25-35 uV) seen predominantly in posterior head regions, symmetric and reactive to eye opening and eye closing. Sleep was characterized by vertex waves, sleep spindles (12 to 14 Hz), maximal frontocentral region. There is an excessive amount of 15 to 18 Hz beta activity  distributed symmetrically and diffusely. Physiologic photic driving was not seen during photic stimulation. Hyperventilation was not performed.   ABNORMALITY - Excessive beta, generalized IMPRESSION: This study is within normal limits. No seizures or epileptiform discharges were seen throughout the recording. The excessive beta activity seen in the background is most likely due to the effect of benzodiazepine and is a benign EEG pattern. Priyanka        Scheduled Meds: . cloBAZam  5 mg Oral QHS  . enoxaparin (LOVENOX) injection  0.5 mg/kg Subcutaneous Q24H  . levETIRAcetam  2,000 mg Oral BID  . naproxen  250 mg Oral BID WC  . oxcarbazepine  1,200 mg Oral BID  . sodium bicarbonate  650 mg Oral BID  . topiramate  200 mg Oral BID   Continuous Infusions:   LOS: 2  days    Time spent: 25 minutes    , MD [facility] 04/20/2020, 4:54 PM     Please page though AMION or Epic secure chat:  For Annabelle Harman, Alberteen Sam

## 2020-04-20 NOTE — Plan of Care (Signed)

## 2020-04-21 NOTE — Progress Notes (Addendum)
Neurology Progress Note  Patient ID: Randy Cordova. is a 50 y.o. with PMHx of  has a past medical history of Epilepsy (HCC), OSA on CPAP, TBI (traumatic brain injury) (HCC), and Thrombocytopenia (HCC).  Initially consulted for: Breakthrough seizures  Major interval events/Subjective: Patient feels like he had another small event overnight He reports he did not sleep well due to being involved in [employer] strategizing regarding the situation in [place] which required a conference call at 3 AM He feels that everyone is fixed on his seizure issues but he is more concerned about his right knee pain which is the reason he came to the hospital   Current Facility-Administered Medications:  .  acetaminophen (TYLENOL) tablet 325 mg, 325 mg, Oral, Q4H PRN **OR** acetaminophen (TYLENOL) suppository 325 mg, 325 mg, Rectal, Q4H PRN, Cox, Amy N, DO .  cloBAZam (ONFI) tablet 5 mg, 5 mg, Oral, QHS, Cox, Amy N, DO, 5 mg at 04/20/20 2005 .  enoxaparin (LOVENOX) injection 92.5 mg, 0.5 mg/kg, Subcutaneous, Q24H, Cox, Amy N, DO, 92.5 mg at 04/20/20 2008 .  levETIRAcetam (KEPPRA) tablet 2,000 mg, 2,000 mg, Oral, BID, Danford, Earl Lites, MD, 2,000 mg at 04/21/20 0953 .  LORazepam (ATIVAN) injection 1-2 mg, 1-2 mg, Intravenous, Q2H PRN, Cox, Amy N, DO, 2 mg at 04/20/20 1008 .  naproxen (NAPROSYN) tablet 375 mg, 375 mg, Oral, TID WC, Danford, Earl Lites, MD, 375 mg at 04/21/20 0955 .  ondansetron (ZOFRAN) tablet 4 mg, 4 mg, Oral, Q6H PRN **OR** ondansetron (ZOFRAN) injection 4 mg, 4 mg, Intravenous, Q6H PRN, Cox, Amy N, DO .  Oxcarbazepine (TRILEPTAL) tablet 1,200 mg, 1,200 mg, Oral, BID, Cox, Amy N, DO, 1,200 mg at 04/21/20 0955 .  oxyCODONE (Oxy IR/ROXICODONE) immediate release tablet 5 mg, 5 mg, Oral, Q6H PRN, Danford, Earl Lites, MD, 5 mg at 04/20/20 2005 .  pyridOXINE (VITAMIN B-6) tablet 200 mg, 200 mg, Oral, Daily, Danford, Earl Lites, MD, 200 mg at 04/21/20 0955 .  sodium bicarbonate tablet  650 mg, 650 mg, Oral, BID, Marrion Coy, MD, 650 mg at 04/21/20 0955 .  topiramate (TOPAMAX) tablet 200 mg, 200 mg, Oral, BID, Cox, Amy N, DO, 200 mg at 04/21/20 0954   Exam: Vitals:   04/21/20 0121 04/21/20 0432  BP: 137/82 (!) 177/89  Pulse: 93 87  Resp: 20 20  Temp: 97.9 F (36.6 C) 98 F (36.7 C)  SpO2: 98% 99%   Gen: In bed, comfortable  Resp: non-labored breathing, no grossly audible wheezing Cardiac: Perfusing extremities well  Abd: soft, nt Psychiatric: somewhat tangential in his answers, but pleasant and cooperative  Neuro: MS: Awake, alert, oriented to person, place, month, year, situation, mild difficulty with stating the days of the week backwards (1 misstep but self corrected) CN: Face with a mild right facial droop, EOMI intact, visual fields intact, tongue midline Motor: No asterixis or pronator drift of the bilateral upper extremities.  Significant pain limitation of the right lower extremity, but antigravity with the left lower extremity Sensory: Intact to light touch in all 4 extremities  Pertinent Labs:  Creatinine 0.89, stable thrombocytopenia at 98, mild hypocalcemia at 8.5, mild hyperglycemia at 106  Impression: Breakthrough seizures in setting of Onfi nonadherence.  Improving.  Given his extensive seizure history, I do not expect full seizure freedom in this patient, but it will be important to control generalized tonic-clonic seizure activity if possible (and certainly does not appear that he has been continuing to have generalized tonic-clonic seizures during this  hospitalization).  In the future may benefit from another EMU stay for spell characterization, however the previously patient has expressed desire to defer this more intensive level of care to physicians in [place] as he is planning to move there within the next few months.  Additionally, review of prior EMU stays and records would be helpful before making decision to move forward with an elective EMU  stay and these are not available to me on an inpatient basis but should be provided to his outpatient neurologist.  Also extensively discussed sleep hygiene with patient but he was reluctant to change his 3 AM appointments at this time.  Additionally discussed with the patient that seizure control will be important to limit further musculoskeletal injury and that medication adherence, including continue Onfi, will be important part in maintaining seizure control.  Recommendations: -Continue Keppra 2000 mg twice daily, with B6 150 to 200 mg daily for mood improvement -Continue Onfi 5 mg nightly, with attention to providing enough on discharge -Continue Trileptal 1200 mg twice daily -Continue topiramate 200 mg twice daily -Discussed EEG results with patient per his request -Continue planned outpatient neurology follow-up with enough antiseizuremedications provided to bridge him through this appointment Please include in discharge instructions: "Standard seizure precautions: Per [facility] statutes, patients with seizures are not allowed to drive until  they have been seizure-free for six months. Use caution when using heavy equipment or power tools. Avoid working on ladders or at heights. Take showers instead of baths. Ensure the water temperature is not too high on the home water heater. Do not go swimming alone. When caring for infants or small children, sit down when holding, feeding, or changing them to minimize risk of injury to the child in the event you have a seizure.  To reduce risk of seizures, maintain good sleep hygiene avoid alcohol and illicit drug use, take all anti-seizure medications as prescribed."  Brooke Dare MD-PhD [facility] [phone]  [facility] coverage for [facility] is from 8 AM to 4 AM in-house and 4 PM to 8 PM by telephone/video. 8 PM to 8 AM emergent questions or overnight urgent questions should be addressed to Teleneurology On-call or  [facility] neurohospitalist; contact information can be found on AMION  A total of 31 minutes were spent in care of this patient today, over 50% of which was in direct patient/family contact, reviewing the chart, performing the examination and reviewing the plan as documented above, including discussion with Dr. Maryfrances Bunnell

## 2020-04-21 NOTE — Progress Notes (Signed)
Physical Therapy Treatment Patient Details Name: Randy Cordova. MRN: [id number] DOB: Feb 02, 1971 Today's Date: 04/21/2020    History of Present Illness Randy Cordova. is a 50 y.o. male with epilepsy who comes in for seizures.  Patient had 3 seizures. Reports R knee pain at 10/10 due to falling from seizures and landing on R knee and L knee pain due to compensation.    PT Comments    Pt in bed upon arrival, reports a breakthrough seizure around 45 minutes prior, but he is will to give his best efforts to participate in session. Max effort to EOB, no assist needed. Pt performs 5 STS transfers throughout session c BRW, no assist needed. Pt AMB for 3 intervals with a chair follow, gets exerted quickly to high degree, but is truly limited more by pain in his knee more than his efforts. The last interval he achieves his greatest distance of both day and admission at 157ft, but it requires pain staking efforts and a chair follow for relief. Pt continues to progress, wants to progress AMB further next day.   Follow Up Recommendations  Supervision - Intermittent;Outpatient PT (does not have a home health benefit, will need OPPT)     Equipment Recommendations   (BRW already delivered)    Recommendations for Other Services       Precautions / Restrictions Precautions Precautions: Fall Precaution Comments: Seizure precautions Required Braces or Orthoses: Other Brace Other Brace: Wearing soft R knee brace upon entry (brace from home) Restrictions Weight Bearing Restrictions: No    Mobility  Bed Mobility Overal bed mobility: Modified Independent             General bed mobility comments: max effort, red in face, dizzy upon siting but without imbalance    Transfers Overall transfer level: Needs assistance Equipment used: Rolling walker (2 wheeled) Transfers: Sit to/from Stand Sit to Stand: Supervision Stand pivot transfers: Supervision       General transfer comment:  increased time  Ambulation/Gait Ambulation/Gait assistance: Supervision;Min guard (chair follow for safety/ seizure precautions) Gait Distance (Feet): 100 Feet (37ft; 4 minute recovery seated; 70ft; 4 minute recovery seated; 140ft;) Assistive device:  (BRW)           Stairs             Wheelchair Mobility    Modified Rankin (Stroke Patients Only)       Balance Overall balance assessment: Modified Independent;History of Falls Sitting-balance support: Bilateral upper extremity supported Sitting balance-Leahy Scale: Good Sitting balance - Comments: Able to sit EOB and mobilize equipment in roomw with no LOB   Standing balance support: Bilateral upper extremity supported Standing balance-Leahy Scale: Poor Standing balance comment: Required bariatric RW for support                            Cognition Arousal/Alertness: Awake/alert Behavior During Therapy: [facility] for tasks assessed/performed;Flat affect;Impulsive Overall Cognitive Status: Within Functional Limits for tasks assessed                                        Exercises      General Comments        Pertinent Vitals/Pain Pain Assessment: 0-10 Faces Pain Scale: Hurts whole lot Pain Location: R knee Pain Descriptors / Indicators: Aching Pain Intervention(s): Limited activity within patient's tolerance;Monitored during session  Home Living Family/patient expects to be discharged to:: Private residence Living Arrangements: Non-relatives/Friends (Best friend "Shawn"; Works 9-5p M-F, provides transportation) Available Help at Discharge: Friend(s);Available PRN/intermittently Type of Home: House Home Access: Stairs to enter Entrance Stairs-Rails: Right;Left Home Layout: Two level;Bed/bath upstairs;Full bath on main level (only BR downstairs is his friend Shawn's room. Pt must be able to get up) Home Equipment: Gilmer Mor - single point Additional Comments: spouse is in w/c and lives  in [place] per pt    Prior Function Level of Independence: Independent with assistive device(s)  Gait / Transfers Assistance Needed: AMB mostly household distances due to chornic persistent knee pain; recent acquisition of SPC, not using ADL's / Homemaking Assistance Needed: typically independent with ADL; pt uses electric wheelchair in grocery store, most of the time Shawn primarily does grocery shopping Comments: Ind amb community distances without an AD, no falls not related to seizure activity, PRN SPC use secondary to weakness after seizures, Ind with ADLs   PT Goals (current goals can now be found in the care plan section) Acute Rehab PT Goals Patient Stated Goal: Return home. Be with wife in [place] once seizures under control PT Goal Formulation: With patient Time For Goal Achievement: 05/03/20 Potential to Achieve Goals: Good Progress towards PT goals: Progressing toward goals    Frequency    Min 2X/week (See daily when possible)      PT Plan Current plan remains appropriate    Co-evaluation              AM-PAC PT "6 Clicks" Mobility   Outcome Measure  Help needed turning from your back to your side while in a flat bed without using bedrails?: None Help needed moving from lying on your back to sitting on the side of a flat bed without using bedrails?: None Help needed moving to and from a bed to a chair (including a wheelchair)?: A Little Help needed standing up from a chair using your arms (e.g., wheelchair or bedside chair)?: A Little Help needed to walk in hospital room?: A Little Help needed climbing 3-5 steps with a railing? : A Little 6 Click Score: 20    End of Session Equipment Utilized During Treatment:  (chair follow) Activity Tolerance: Patient tolerated treatment well;Patient limited by pain;Patient limited by fatigue Patient left: in bed;with call bell/phone within reach;with bed alarm set Nurse Communication: Mobility status;Precautions PT Visit  Diagnosis: Unsteadiness on feet (R26.81);Difficulty in walking, not elsewhere classified (R26.2);Other abnormalities of gait and mobility (R26.89);Muscle weakness (generalized) (M62.81);History of falling (Z91.81);Pain Pain - Right/Left: Right Pain - part of body: Knee     Time: 1610-9604 PT Time Calculation (min) (ACUTE ONLY): 27 min  Charges:  $Gait Training: 23-37 mins                     6:53 PM, 04/21/20 Rosamaria Lints, PT, DPT Physical Therapist - [facility]  [phone] (ASCOM)     Ugonna Keirsey C 04/21/2020, 6:49 PM

## 2020-04-21 NOTE — Evaluation (Signed)
Occupational Therapy Evaluation Patient Details Name: Randy Cordova. MRN: [id number] DOB: 09/20/1970 Today's Date: 04/21/2020    History of Present Illness Randy Cordova. is a 50 y.o. male with epilepsy who comes in for seizures.  Patient had 3 seizures. Reports R knee pain at 10/10 due to falling from seizures and landing on R knee and L knee pain due to compensation.   Clinical Impression   Pt seen for OT Evaluation this date in setting of acute hospitalization d/t 3 recent seizures and knee pain. Pt reports being INDEP with basic self care at baseline, relies on his best friend and roommate for transportation and groceries as pt is unable to drive 2/2 epilepsy. On assessment, although effortful, pt able to come to EOB sitting with HOB elevated and increased time at MOD I. Pt is able to perform STS with SUPV and increased time and benefits from UE support 2/2 knee pain to use UEs to offset pressure. Pt requires MIN A for LB dressing at this time 2/2 knee pain, but demos external rotation technique that he typically uses for LB ADLs. In general, pt is close to baseline, but with some decreased LB ADL and standing fxl activity tolerance in setting of knee pain 2/2 falls r/t seizures. Pt will benefit from skilled OT in acute setting. Chart review indicates no therapy f/u will be available. If possible, pt could certainly benefit from f/u in the home setting to ensure safe negotiation of home environment s/p d/c from acute setting,    Follow Up Recommendations  Home health OT;Supervision - Intermittent    Equipment Recommendations  Tub/shower bench;Other (comment) (bari RW)    Recommendations for Other Services       Precautions / Restrictions Precautions Precautions: Fall Precaution Comments: Seizure precautions Required Braces or Orthoses: Other Brace Other Brace: Wearing soft R knee brace upon entry (brace from home) Restrictions Weight Bearing Restrictions: No      Mobility  Bed Mobility Overal bed mobility: Modified Independent             General bed mobility comments: max effort, red in face, dizzy upon siting but without imbalance    Transfers Overall transfer level: Needs assistance Equipment used: Rolling walker (2 wheeled) Transfers: Sit to/from Stand Sit to Stand: Supervision Stand pivot transfers: Supervision       General transfer comment: increased time    Balance Overall balance assessment: Modified Independent;History of Falls Sitting-balance support: Bilateral upper extremity supported Sitting balance-Leahy Scale: Good Sitting balance - Comments: Able to sit EOB and mobilize equipment in roomw with no LOB   Standing balance support: Bilateral upper extremity supported Standing balance-Leahy Scale: Poor Standing balance comment: Required bariatric RW for support                           ADL either performed or assessed with clinical judgement   ADL Overall ADL's : Needs assistance/impaired                                       General ADL Comments: Pt able to perform all seated UB ADLs with SETUP, LB ADLs requires increased time, and MIN A. Uses external rotation technique at baseline     Vision Baseline Vision/History: Wears glasses Wears Glasses: At all times Patient Visual Report: No change from baseline       Perception  Praxis      Pertinent Vitals/Pain Pain Assessment: 0-10 Faces Pain Scale: Hurts whole lot Pain Location: R knee Pain Descriptors / Indicators: Aching Pain Intervention(s): Limited activity within patient's tolerance;Monitored during session     Hand Dominance Left   Extremity/Trunk Assessment Upper Extremity Assessment Upper Extremity Assessment: Overall WFL for tasks assessed   Lower Extremity Assessment Lower Extremity Assessment: Overall WFL for tasks assessed   Cervical / Trunk Assessment Cervical / Trunk Assessment: Normal   Communication  Communication Communication: No difficulties   Cognition Arousal/Alertness: Awake/alert Behavior During Therapy: WFL for tasks assessed/performed;Flat affect;Impulsive Overall Cognitive Status: Within Functional Limits for tasks assessed                                     General Comments       Exercises Other Exercises Other Exercises: OT facilitates ed re: role of OT in acute setting, seated/bed level core adn UE exercises pt can complete outside of therapy time including visual demonstration of toe reaches/modified bed level situp, contralateral reaching, and straight arm rasises with postural extension. Pt with good comprehension and demos understanding, Will require f/u to assess for correct form, pace and technique.   Shoulder Instructions      Home Living Family/patient expects to be discharged to:: Private residence Living Arrangements: Non-relatives/Friends (Best friend "Shawn"; Works 9-5p M-F, provides transportation) Available Help at Discharge: Friend(s);Available PRN/intermittently Type of Home: House Home Access: Stairs to enter [employer] of Steps: 8 Entrance Stairs-Rails: Right;Left Home Layout: Two level;Bed/bath upstairs;Full bath on main level (only BR downstairs is his friend Shawn's room. Pt must be able to get up) Alternate Level Stairs-Number of Steps: 14   Bathroom Shower/Tub: Chief Strategy Officer: Standard     Home Equipment: Gilmer Mor - single point   Additional Comments: spouse is in w/c and lives in [place] per pt      Prior Functioning/Environment Level of Independence: Independent with assistive device(s)  Gait / Transfers Assistance Needed: AMB mostly household distances due to chornic persistent knee pain; recent acquisition of SPC, not using ADL's / Homemaking Assistance Needed: typically independent with ADL; pt uses electric wheelchair in grocery store, most of the time Shawn primarily does grocery  shopping   Comments: Ind amb community distances without an AD, no falls not related to seizure activity, PRN SPC use secondary to weakness after seizures, Ind with ADLs        OT Problem List: Decreased strength;Decreased activity tolerance;Impaired balance (sitting and/or standing);Cardiopulmonary status limiting activity;Obesity;Pain      OT Treatment/Interventions: Self-care/ADL training;DME and/or AE instruction;Balance training;Therapeutic activities;Therapeutic exercise;Energy conservation;Patient/family education    OT Goals(Current goals can be found in the care plan section) Acute Rehab OT Goals Patient Stated Goal: Return home. Be with wife in [place] once seizures under control OT Goal Formulation: With patient Time For Goal Achievement: 05/05/20 Potential to Achieve Goals: Good  OT Frequency: Min 2X/week   Barriers to D/C: Inaccessible home environment  flight of stairs to bedroom       Co-evaluation              AM-PAC OT "6 Clicks" Daily Activity     Outcome Measure Help from another person eating meals?: None Help from another person taking care of personal grooming?: None Help from another person toileting, which includes using toliet, bedpan, or urinal?: None Help from another person bathing (including washing,  rinsing, drying)?: A Little Help from another person to put on and taking off regular upper body clothing?: None Help from another person to put on and taking off regular lower body clothing?: A Little 6 Click Score: 22   End of Session Equipment Utilized During Treatment: Rolling walker  Activity Tolerance: Patient tolerated treatment well Patient left: in bed;with call bell/phone within reach  OT Visit Diagnosis: Unsteadiness on feet (R26.81);Muscle weakness (generalized) (M62.81);Other symptoms and signs involving the nervous system (B34.193)                Time: 7902-4097 OT Time Calculation (min): 41 min Charges:  OT General Charges $OT  Visit: 1 Visit OT Evaluation $OT Eval Moderate Complexity: 1 Mod OT Treatments $Self Care/Home Management : 8-22 mins $Therapeutic Activity: 8-22 mins  Rejeana Brock, MS, OTR/L ascom [phone] 04/21/20, 9:12 PM

## 2020-04-21 NOTE — Progress Notes (Signed)
[place] [facility] PROGRESS NOTE    Angela Platner.  ZCH:[id number] DOB: 07-16-1970 DOA: 04/17/2020 PCP: Pcp, No      Brief Narrative:  Mr. Granieri is a 50 y.o. M with epilepsy, hx TBI, MO, obesity, and OSA who presented with a seizure cluster and knee pain.  Patient reports frequent seizures, up to twice per week.  This week, had a cluster of more frequent seizures, of greater intensity, including one that led to knee pain (somehow injured the knee during a seizure with subsequent swelling), so he came to the ER.  In the ER, patient had 2 witnessed seizures overnight.  Loaded with Dilantin.         Assessment & Plan:  Uncontrolled epilepsy with breakthrough seizures No seizures since yesterday, but patient says he may have had a seizure overnight in his sleep, because he is sore today and has a lot of phlegm.    -Continue Onfi trileptal, lamictal -Continue Keppra at new dose -Continue B6    Thrombocytopenia Stable, no change  OSA -Continue CPAP  right knee due to Degenerative joint disease,  Mechanical injury during seizures.  No effusion on exam, although limited by habitus.  X-ray negative for fracture but shows degenerative disease and small effusion.  -Rest, ice compression -continue tid naproxen -PT       Disposition: Status is: Inpatient  Remains inpatient appropriate because:ongoing seizures   Dispo: The patient is from: Home              Anticipated d/c is to: Home              Patient currently is not medically stable to d/c.   Difficult to place patient No    Patient presented with uncontrolled seizures, also his primary complaint was knee pain.  Here, the knee pain is degenerative in character, requires longterm PT, NSAIDs and outpatient follow up.  But it precludes walking and he will need further rehab here, we have recommended SNF, but so far have been unable to find snf bed.  We will continue PT here, until he is safe to  return home or has obtained a SNF bed.    Level of care: Med-Surg       MDM: The below labs and imaging reports were reviewed and summarized above.  Medication management as above.   DVT prophylaxis: Place TED hose Start: 04/17/20 2017 Lovenox  Code Status: FULL Family Communication:     Consultants:   Neurology  Procedures:   3/16 EEG -- normal        Subjective: No seizures.  No fever.  No malaise, confusion.  Has had some phlegm and some soreness overnight which he thinks is from a seizure in his sleep.  He was video chatting by Skype most of the night with international freedom fighters in the [place], he states.  Knee pain still hurting but better.      Objective: Vitals:   04/20/20 1944 04/21/20 0121 04/21/20 0432 04/21/20 1233  BP: (!) 148/75 137/82 (!) 177/89 118/67  Pulse: 87 93 87 88  Resp: 16 20 20 16   Temp: 97.9 F (36.6 C) 97.9 F (36.6 C) 98 F (36.7 C) 97.8 F (36.6 C)  TempSrc: Oral Oral Oral Oral  SpO2: 96% 98% 99% 98%  Weight:      Height:        Intake/Output Summary (Last 24 hours) at 04/21/2020 1538 Last data filed at 04/21/2020 1400 Gross per 24 hour  Intake 600 ml  Output -  Net 600 ml   Filed Weights   04/17/20 1804 04/18/20 1547  Weight: (!) 183.7 kg (!) 209 kg    Examination: General appearance: Obese adult male, sitting in recliner, no acute distress, on his phone     HEENT:    Skin:  Cardiac: RRR, no murmurs, no lower extremity edema Respiratory: Normal respiratory rate and rhythm, lungs clear without rales or wheezes Abdomen:   MSK:  Neuro: Awake and alert, extraocular movements intact, moves all extremities with generalized weakness, right leg strength limited by pain.  Speech fluent Psych: Attention normal, affect normal, judgment insight appear normal     Data Reviewed: I have personally reviewed following labs and imaging studies:  CBC: Recent Labs  Lab 04/17/20 1812 04/18/20 0458 04/19/20 0516   WBC 5.0 5.4 4.4  NEUTROABS  --   --  2.1  HGB 13.5 13.2 13.3  HCT 40.6 40.4 39.9  MCV 89.0 92.7 89.9  PLT 106* 103* 98*   Basic Metabolic Panel: Recent Labs  Lab 04/17/20 1812 04/18/20 0458 04/19/20 0516  NA 139 137 140  K 3.8 3.4* 3.7  CL 112* 112* 112*  CO2 20* 18* 23  GLUCOSE 111* 137* 106*  BUN 17 18 15   CREATININE 0.92 0.89 0.89  CALCIUM 8.7* 8.5* 8.5*  MG  --   --  2.1   GFR: Estimated Creatinine Clearance: 184.9 mL/min (by C-G formula based on SCr of 0.89 mg/dL). Liver Function Tests: Recent Labs  Lab 04/18/20 0458  AST 20  ALT 17  ALKPHOS 76  BILITOT 0.7  PROT 6.8  ALBUMIN 3.5   No results for input(s): LIPASE, AMYLASE in the last 168 hours. No results for input(s): AMMONIA in the last 168 hours. Coagulation Profile: No results for input(s): INR, PROTIME in the last 168 hours. Cardiac Enzymes: No results for input(s): CKTOTAL, CKMB, CKMBINDEX, TROPONINI in the last 168 hours. BNP (last 3 results) No results for input(s): PROBNP in the last 8760 hours. HbA1C: No results for input(s): HGBA1C in the last 72 hours. CBG: No results for input(s): GLUCAP in the last 168 hours. Lipid Profile: No results for input(s): CHOL, HDL, LDLCALC, TRIG, CHOLHDL, LDLDIRECT in the last 72 hours. Thyroid Function Tests: No results for input(s): TSH, T4TOTAL, FREET4, T3FREE, THYROIDAB in the last 72 hours. Anemia Panel: No results for input(s): VITAMINB12, FOLATE, FERRITIN, TIBC, IRON, RETICCTPCT in the last 72 hours. Urine analysis:    Component Value Date/Time   COLORURINE YELLOW (A) 03/19/2020 1817   APPEARANCEUR HAZY (A) 03/19/2020 1817   LABSPEC 1.024 03/19/2020 1817   PHURINE 5.0 03/19/2020 1817   GLUCOSEU NEGATIVE 03/19/2020 1817   HGBUR NEGATIVE 03/19/2020 1817   BILIRUBINUR NEGATIVE 03/19/2020 1817   KETONESUR NEGATIVE 03/19/2020 1817   PROTEINUR NEGATIVE 03/19/2020 1817   NITRITE NEGATIVE 03/19/2020 1817   LEUKOCYTESUR NEGATIVE 03/19/2020 1817    Sepsis Labs: @LABRCNTIP (procalcitonin:4,lacticacidven:4)  ) Recent Results (from the past 240 hour(s))  SARS CORONAVIRUS 2 (TAT 6-24 HRS) Nasopharyngeal Nasopharyngeal Swab     Status: None   Collection Time: 04/17/20  8:20 PM   Specimen: Nasopharyngeal Swab  Result Value Ref Range Status   SARS Coronavirus 2 NEGATIVE NEGATIVE Final    Comment: (NOTE) SARS-CoV-2 target nucleic acids are NOT DETECTED.  The SARS-CoV-2 RNA is generally detectable in upper and lower respiratory specimens during the acute phase of infection. Negative results do not preclude SARS-CoV-2 infection, do not rule out co-infections with other pathogens,  and should not be used as the sole basis for treatment or other patient management decisions. Negative results must be combined with clinical observations, patient history, and epidemiological information. The expected result is Negative.  Fact Sheet for Patients: HairSlick.no  Fact Sheet for Healthcare Providers: quierodirigir.com  This test is not yet approved or cleared by the [place] FDA and  has been authorized for detection and/or diagnosis of SARS-CoV-2 by FDA under an Emergency Use Authorization (EUA). This EUA will remain  in effect (meaning this test can be used) for the duration of the COVID-19 declaration under Se ction 564(b)(1) of the Act, 21 U.S.C. section 360bbb-3(b)(1), unless the authorization is terminated or revoked sooner.  Performed at [facility], [address] [place], [place] [address]          Radiology Studies: No results found.      Scheduled Meds: . cloBAZam  5 mg Oral QHS  . enoxaparin (LOVENOX) injection  0.5 mg/kg Subcutaneous Q24H  . levETIRAcetam  2,000 mg Oral BID  . naproxen  375 mg Oral TID WC  . oxcarbazepine  1,200 mg Oral BID  . vitamin B-6  200 mg Oral Daily  . sodium bicarbonate  650 mg Oral BID  . topiramate  200 mg Oral  BID   Continuous Infusions:   LOS: 3 days    Time spent: 25 minutes    Alberteen Sam, MD [facility] 04/21/2020, 3:38 PM     Please page though AMION or Epic secure chat:  For [employer], Higher education careers adviser

## 2020-04-21 NOTE — TOC Progression Note (Signed)
Transition of Care (TOC) - Progression Note    Patient Details  Name: Randy Cordova. MRN: [id number] Date of Birth: Oct 24, 1970  Transition of [facility]) CM/SW Contact  Barrie Dunker, RN Phone Number: 04/21/2020, 1:52 PM  Clinical Narrative:    Patient has Bariatric Rolling walker in the room to take home with him He is independent at home Possibly going home tomorrow    Expected Discharge Plan: (P) Home/Self Care Barriers to Discharge: (P) Continued Medical Work up  Expected Discharge Plan and Services Expected Discharge Plan: (P) Home/Self Care       Living arrangements for the past 2 months: (P) Single Family Home                                       Social Determinants of Health (SDOH) Interventions    Readmission Risk Interventions No flowsheet data found.

## 2020-04-22 LAB — CBC
HCT: 38.2 % — ABNORMAL LOW (ref 39.0–52.0)
Hemoglobin: 12.9 g/dL — ABNORMAL LOW (ref 13.0–17.0)
MCH: 30.2 pg (ref 26.0–34.0)
MCHC: 33.8 g/dL (ref 30.0–36.0)
MCV: 89.5 fL (ref 80.0–100.0)
Platelets: 99 10*3/uL — ABNORMAL LOW (ref 150–400)
RBC: 4.27 MIL/uL (ref 4.22–5.81)
RDW: 14.6 % (ref 11.5–15.5)
WBC: 4.8 10*3/uL (ref 4.0–10.5)
nRBC: 0 % (ref 0.0–0.2)

## 2020-04-22 LAB — CREATININE, SERUM
Creatinine, Ser: 0.68 mg/dL (ref 0.61–1.24)
GFR, Estimated: >60 mL/min (ref >60)

## 2020-04-22 LAB — LEVETIRACETAM LEVEL: Levetiracetam Lvl: 19.5 ug/mL (ref 10.0–40.0)

## 2020-04-22 NOTE — Progress Notes (Signed)
Physical Therapy Treatment Patient Details Name: Randy Cordova. MRN: [id number] DOB: January 17, 1971 Today's Date: 04/22/2020    History of Present Illness Randy Cordova. is a 50 y.o. male with epilepsy who comes in for seizures.  Patient had 3 seizures. Reports R knee pain at 10/10 due to falling from seizures and landing on R knee and L knee pain due to compensation.    PT Comments    Pt was seated in recliner upon arriving. He agrees to session and is cooperative throughout. Has friend visiting and dog with him. Pt was easily able to stand from recliner and ambulate 200 ft without LOB. Does endorse fatigue and knee pain but sao2 >93% and HR only to 110 bpm. Recommend DC home with [facility] however pt unable to receive [facility] services. Recommend outpatient PT at DC.     Follow Up Recommendations  Supervision - Intermittent;Outpatient PT     Equipment Recommendations  Other (comment) (bariatric RW)       Precautions / Restrictions Precautions Precautions: Fall Precaution Comments: Seizure precautions Required Braces or Orthoses: Other Brace Other Brace: Wearing soft R knee brace upon entry (brace from home) Restrictions Weight Bearing Restrictions: No    Mobility  Bed Mobility      General bed mobility comments: In recliner pre/post session    Transfers Overall transfer level: Needs assistance Equipment used: Rolling walker (2 wheeled) Transfers: Sit to/from Stand Sit to Stand: Supervision Stand pivot transfers: Supervision       General transfer comment: Pt was easily able to stand from recliner with vcs only.  Ambulation/Gait Ambulation/Gait assistance: Supervision Gait Distance (Feet): 200 Feet Assistive device: Rolling walker (2 wheeled) (baraitric) Gait Pattern/deviations: Decreased step length - right;Decreased step length - left;Decreased stride length Gait velocity: decreased   General Gait Details: Decreased gait velocity       Balance Overall balance  assessment: Modified Independent;History of Falls Sitting-balance support: Bilateral upper extremity supported Sitting balance-Leahy Scale: Good     Standing balance support: Bilateral upper extremity supported;During functional activity Standing balance-Leahy Scale: Fair Standing balance comment: Required bariatric RW for support       Cognition Arousal/Alertness: Awake/alert Behavior During Therapy: WFL for tasks assessed/performed Overall Cognitive Status: Within Functional Limits for tasks assessed      General Comments: Pt is A and O. Has roomate and dog present throughout session.             Pertinent Vitals/Pain Pain Assessment: No/denies pain Pain Score: 0-No pain           PT Goals (current goals can now be found in the care plan section) Acute Rehab PT Goals Patient Stated Goal: Return home. Be with wife in [place] once seizures under control Progress towards PT goals: Progressing toward goals    Frequency    Min 2X/week      PT Plan Current plan remains appropriate       AM-PAC PT "6 Clicks" Mobility   Outcome Measure  Help needed turning from your back to your side while in a flat bed without using bedrails?: None Help needed moving from lying on your back to sitting on the side of a flat bed without using bedrails?: None Help needed moving to and from a bed to a chair (including a wheelchair)?: A Little Help needed standing up from a chair using your arms (e.g., wheelchair or bedside chair)?: A Little Help needed to walk in hospital room?: A Little Help needed climbing 3-5 steps with a  railing? : A Little 6 Click Score: 20    End of Session Equipment Utilized During Treatment: Gait belt Activity Tolerance: Patient tolerated treatment well Patient left: in bed;with call bell/phone within reach;with bed alarm set Nurse Communication: Mobility status;Precautions PT Visit Diagnosis: Unsteadiness on feet (R26.81);Difficulty in walking, not  elsewhere classified (R26.2);Other abnormalities of gait and mobility (R26.89);Muscle weakness (generalized) (M62.81);History of falling (Z91.81);Pain Pain - Right/Left: Right Pain - part of body: Knee     Time: 1400-1418 PT Time Calculation (min) (ACUTE ONLY): 18 min  Charges:  $Gait Training: 8-22 mins                     Jetta Lout PTA 04/22/20, 4:08 PM

## 2020-04-22 NOTE — Progress Notes (Signed)
Pt refuses bed alarm. Educated on why bed alarm is used. Still continues to refuse

## 2020-04-22 NOTE — Evaluation (Signed)
Occupational Therapy Evaluation Patient Details Name: Randy Cordova. MRN: [id number] DOB: [birth year] Today's Date: 04/22/2020    History of Present Illness Randy Cordova. is a 50 y.o. male with epilepsy who comes in for seizures.  Patient had 3 seizures. Reports R knee pain at 10/10 due to falling from seizures and landing on R knee and L knee pain due to compensation.   Clinical Impression   Pt seen for OT treatment this date to improve strength for ADL tasks. OT engages pt in core exercise including contralateral reaching x10 alternating, straight arm raises with postural extension x10, flutter kicks x15 alternating, double leg raise x10, and russian twists x10 alternating sides. Pt requires 2-3 min rest break between exercises. Core ex performed to improve stability and strentgh for dynamic ADL tasks. Pt tolerates well. Handout issued. Left in bed with all needs met and in reach, roommate present throughout, RN presenting to administer pain medication.     Follow Up Recommendations  Home health OT;Supervision - Intermittent    Equipment Recommendations  Tub/shower bench;Other (comment) (bari rw)    Recommendations for Other Services       Precautions / Restrictions Precautions Precautions: Fall Precaution Comments: Seizure precautions Required Braces or Orthoses: Other Brace Other Brace: Wearing soft R knee brace upon entry (brace from home) Restrictions Weight Bearing Restrictions: No      Mobility Bed Mobility               General bed mobility comments: In recliner pre/post session    Transfers Overall transfer level: Needs assistance Equipment used: Rolling walker (2 wheeled) Transfers: Sit to/from Stand Sit to Stand: Supervision Stand pivot transfers: Supervision       General transfer comment: Pt was easily able to stand from recliner with vcs only.    Balance Overall balance assessment: Modified Independent;History of Falls Sitting-balance  support: Bilateral upper extremity supported Sitting balance-Leahy Scale: Good     Standing balance support: Bilateral upper extremity supported;During functional activity Standing balance-Leahy Scale: Fair Standing balance comment: Required bariatric RW for support                           ADL either performed or assessed with clinical judgement   ADL                                               Vision Baseline Vision/History: Wears glasses Wears Glasses: At all times Patient Visual Report: No change from baseline       Perception     Praxis      Pertinent Vitals/Pain Pain Assessment: Faces Pain Score: 0-No pain Faces Pain Scale: Hurts whole lot Pain Location: R knee Pain Descriptors / Indicators: Aching Pain Intervention(s): Limited activity within patient's tolerance;Monitored during session;Patient requesting pain meds-RN notified;RN gave pain meds during session     Hand Dominance     Extremity/Trunk Assessment             Communication     Cognition Arousal/Alertness: Awake/alert Behavior During Therapy: WFL for tasks assessed/performed Overall Cognitive Status: Within Functional Limits for tasks assessed                                 General Comments: Pt is A and  O. Has roomate and dog present throughout session.   General Comments       Exercises Other Exercises Other Exercises: OT engages pt in core exercise including contralateral reaching x10 alternating, straight arm raises with postural extension x10, flutter kicks x15 alternating, double leg raise x10, and russian twists x10 alternating sides. Pt requires 2-3 min rest break between exercises. Core ex performed to improve stability and strentgh for dynamic ADL tasks. Pt tolerates well. Handout issued.   Shoulder Instructions      Home Living                                          Prior Functioning/Environment                    OT Problem List: Decreased strength;Decreased activity tolerance;Impaired balance (sitting and/or standing);Cardiopulmonary status limiting activity;Obesity;Pain      OT Treatment/Interventions: Self-care/ADL training;DME and/or AE instruction;Balance training;Therapeutic activities;Therapeutic exercise;Energy conservation;Patient/family education    OT Goals(Current goals can be found in the care plan section) Acute Rehab OT Goals Patient Stated Goal: Return home. Be with wife in [place] once seizures under control OT Goal Formulation: With patient Time For Goal Achievement: 05/05/20 Potential to Achieve Goals: Good  OT Frequency: Min 2X/week   Barriers to D/C: Inaccessible home environment          Co-evaluation              AM-PAC OT "6 Clicks" Daily Activity     Outcome Measure Help from another person eating meals?: None Help from another person taking care of personal grooming?: None Help from another person toileting, which includes using toliet, bedpan, or urinal?: None Help from another person bathing (including washing, rinsing, drying)?: A Little Help from another person to put on and taking off regular upper body clothing?: None Help from another person to put on and taking off regular lower body clothing?: A Little 6 Click Score: 22   End of Session    Activity Tolerance: Patient tolerated treatment well Patient left: in bed;with call bell/phone within reach  OT Visit Diagnosis: Unsteadiness on feet (R26.81);Muscle weakness (generalized) (M62.81);Other symptoms and signs involving the nervous system (R29.898)                Time: 6270-3500 OT Time Calculation (min): 25 min Charges:  OT General Charges $OT Visit: 1 Visit OT Treatments $Therapeutic Exercise: 23-37 mins  Gerrianne Scale, Clarinda, OTR/L ascom [phone] 04/22/20, 6:12 PM

## 2020-04-22 NOTE — Progress Notes (Signed)
[facility] PROGRESS NOTE    Randy Cordova.  RAQ:[id number] DOB: 01/21/1971 DOA: 04/17/2020 PCP: Pcp, No      Brief Narrative:  Randy Cordova is a 50 y.o. M with epilepsy, hx TBI, MO, obesity, and OSA who presented with a seizure cluster and knee pain.  Patient reports frequent seizures, up to twice per week.  This week, had a cluster of more frequent seizures, of greater intensity, including one that led to knee pain (somehow injured the knee during a seizure with subsequent swelling), so he came to the ER.  In the ER, patient had 2 witnessed seizures overnight.  Loaded with Dilantin.         Assessment & Plan:  Uncontrolled epilepsy with breakthrough seizures Patient reports one unwitnessed seizures last night, brief, not GTC he thinks, while sitting in a chair.    -Continue Onfi, Trileptal, Lamictal -Continue Keppra at new dose -Continue B6     Thrombocytopenia Stable, no change  OSA -Continue CPAP  Right knee pain due to Degenerative joint disease,  Mechanical injury during seizures.  No effusion on exam, although limited by habitus.  X-ray negative for fracture but shows degenerative disease and small effusion.   -Rest, ice compression -Continue naproxen TID (he is only taking once or twice per day he says) -Continue acetaminophen and judicious oxycodone -PT       Disposition: Status is: Inpatient  Remains inpatient appropriate because:ongoing seizures   Dispo: The patient is from: Home              Anticipated d/c is to: Home              Patient currently is not medically stable to d/c.   Difficult to place patient No   Patient developed flare of degenerative knee pain from seizure.    We have recommended SNF, but so far have been unable to find snf bed.  We will continue PT here, until he is safe to return home or has obtained a SNF bed.   He is making considerable progress but in my medical opinion today, he still has too  much pain to bear weight long enough to bathe, walk up stairs into his home, or walk sufficient distance for returning home unsupervised.  I am optimistic he may be better tomorrow    Level of care: Med-Surg       MDM: The below labs and imaging reports were reviewed and summarized above.  Medication management as above.   DVT prophylaxis: Place TED hose Start: 04/17/20 2017 Lovenox  Code Status: FULL Family Communication:     Consultants:   Neurology  Procedures:   3/16 EEG -- normal        Subjective: Issue overnight, no fever, confusion.  His knee is still hurting, worse than yesterday.      Objective: Vitals:   04/22/20 0034 04/22/20 0511 04/22/20 0832 04/22/20 1207  BP: 121/61 121/71 (!) 104/59 126/63  Pulse: 98 92 90 82  Resp: 18 16 18 18   Temp: 97.8 F (36.6 C) (!) 97.5 F (36.4 C) 97.8 F (36.6 C) 97.7 F (36.5 C)  TempSrc: Oral Oral Oral Oral  SpO2: 97% 92% 95% 99%  Weight:      Height:        Intake/Output Summary (Last 24 hours) at 04/22/2020 1436 Last data filed at 04/22/2020 1402 Gross per 24 hour  Intake 600 ml  Output --  Net 600 ml   04/24/2020  04/17/20 1804 04/18/20 1547  Weight: (!) 183.7 kg (!) 209 kg    Examination: General appearance: Obese adult male, sitting in recliner, no acute distress, on his phone     HEENT:    Skin:  Cardiac: RRR, no murmurs, no lower extremity edema Respiratory: Normal respiratory rate and rhythm, lungs clear without rales or wheezes Abdomen:   MSK:  Neuro: Awake and alert, extraocular movements intact, moves all extremities with generalized weakness, right leg strength limited by pain.  Speech fluent Psych: Attention normal, affect normal, judgment insight appear normal     Data Reviewed: I have personally reviewed following labs and imaging studies:  CBC: Recent Labs  Lab 04/17/20 1812 04/18/20 0458 04/19/20 0516 04/22/20 0518  WBC 5.0 5.4 4.4 4.8  NEUTROABS  --   --  2.1   --   HGB 13.5 13.2 13.3 12.9*  HCT 40.6 40.4 39.9 38.2*  MCV 89.0 92.7 89.9 89.5  PLT 106* 103* 98* 99*   Basic Metabolic Panel: Recent Labs  Lab 04/17/20 1812 04/18/20 0458 04/19/20 0516 04/22/20 0518  NA 139 137 140  --   K 3.8 3.4* 3.7  --   CL 112* 112* 112*  --   CO2 20* 18* 23  --   GLUCOSE 111* 137* 106*  --   BUN 17 18 15   --   CREATININE 0.92 0.89 0.89 0.68  CALCIUM 8.7* 8.5* 8.5*  --   MG  --   --  2.1  --    GFR: Estimated Creatinine Clearance: 205.7 mL/min (by C-G formula based on SCr of 0.68 mg/dL). Liver Function Tests: Recent Labs  Lab 04/18/20 0458  AST 20  ALT 17  ALKPHOS 76  BILITOT 0.7  PROT 6.8  ALBUMIN 3.5   No results for input(s): LIPASE, AMYLASE in the last 168 hours. No results for input(s): AMMONIA in the last 168 hours. Coagulation Profile: No results for input(s): INR, PROTIME in the last 168 hours. Cardiac Enzymes: No results for input(s): CKTOTAL, CKMB, CKMBINDEX, TROPONINI in the last 168 hours. BNP (last 3 results) No results for input(s): PROBNP in the last 8760 hours. HbA1C: No results for input(s): HGBA1C in the last 72 hours. CBG: No results for input(s): GLUCAP in the last 168 hours. Lipid Profile: No results for input(s): CHOL, HDL, LDLCALC, TRIG, CHOLHDL, LDLDIRECT in the last 72 hours. Thyroid Function Tests: No results for input(s): TSH, T4TOTAL, FREET4, T3FREE, THYROIDAB in the last 72 hours. Anemia Panel: No results for input(s): VITAMINB12, FOLATE, FERRITIN, TIBC, IRON, RETICCTPCT in the last 72 hours. Urine analysis:    Component Value Date/Time   COLORURINE YELLOW (A) 03/19/2020 1817   APPEARANCEUR HAZY (A) 03/19/2020 1817   LABSPEC 1.024 03/19/2020 1817   PHURINE 5.0 03/19/2020 1817   GLUCOSEU NEGATIVE 03/19/2020 1817   HGBUR NEGATIVE 03/19/2020 1817   BILIRUBINUR NEGATIVE 03/19/2020 1817   KETONESUR NEGATIVE 03/19/2020 1817   PROTEINUR NEGATIVE 03/19/2020 1817   NITRITE NEGATIVE 03/19/2020 1817    LEUKOCYTESUR NEGATIVE 03/19/2020 1817   Sepsis Labs: @LABRCNTIP (procalcitonin:4,lacticacidven:4)  ) Recent Results (from the past 240 hour(s))  SARS CORONAVIRUS 2 (TAT 6-24 HRS) Nasopharyngeal Nasopharyngeal Swab     Status: None   Collection Time: 04/17/20  8:20 PM   Specimen: Nasopharyngeal Swab  Result Value Ref Range Status   SARS Coronavirus 2 NEGATIVE NEGATIVE Final    Comment: (NOTE) SARS-CoV-2 target nucleic acids are NOT DETECTED.  The SARS-CoV-2 RNA is generally detectable in upper and lower respiratory specimens  during the acute phase of infection. Negative results do not preclude SARS-CoV-2 infection, do not rule out co-infections with other pathogens, and should not be used as the sole basis for treatment or other patient management decisions. Negative results must be combined with clinical observations, patient history, and epidemiological information. The expected result is Negative.  Fact Sheet for Patients: HairSlick.no  Fact Sheet for Healthcare Providers: quierodirigir.com  This test is not yet approved or cleared by the [place] FDA and  has been authorized for detection and/or diagnosis of SARS-CoV-2 by FDA under an Emergency Use Authorization (EUA). This EUA will remain  in effect (meaning this test can be used) for the duration of the COVID-19 declaration under Se ction 564(b)(1) of the Act, 21 U.S.C. section 360bbb-3(b)(1), unless the authorization is terminated or revoked sooner.  Performed at [facility], [address], [place], [place] [address]          Radiology Studies: No results found.      Scheduled Meds: . cloBAZam  5 mg Oral QHS  . enoxaparin (LOVENOX) injection  0.5 mg/kg Subcutaneous Q24H  . levETIRAcetam  2,000 mg Oral BID  . naproxen  375 mg Oral TID WC  . oxcarbazepine  1,200 mg Oral BID  . vitamin B-6  200 mg Oral Daily  . sodium bicarbonate  650 mg  Oral BID  . topiramate  200 mg Oral BID   Continuous Infusions:   LOS: 4 days    Time spent: 25 minutes    Alberteen Sam, MD [facility] 04/22/2020, 2:36 PM     Please page though AMION or Epic secure chat:  For [employer], Higher education careers adviser

## 2020-04-23 MED ORDER — OXYCODONE HCL 5 MG PO TABS
5.0000 mg | ORAL_TABLET | Freq: Three times a day (TID) | ORAL | 0 refills | Status: DC | PRN
Start: 2020-04-23 — End: 2020-12-06

## 2020-04-23 MED ORDER — PYRIDOXINE HCL 200 MG PO TABS: 200.0000 mg | ORAL_TABLET | Freq: Every day | ORAL | 1 refills | Status: AC

## 2020-04-23 MED ORDER — CLOBAZAM 10 MG PO TABS: 5.0000 mg | ORAL_TABLET | Freq: Every day | ORAL | 2 refills | Status: DC

## 2020-04-23 MED ORDER — LEVETIRACETAM 750 MG PO TABS: 1500.0000 mg | ORAL_TABLET | Freq: Two times a day (BID) | ORAL | 2 refills | Status: AC

## 2020-04-23 NOTE — Discharge Summary (Signed)
Physician Discharge Summary  [name]. HYW:[id number] DOB: 03/11/1970 DOA: 04/17/2020  PCP: Pcp, No  Admit date: 04/17/2020 Discharge date: 04/23/2020  Admitted From: Home  Disposition:  Home   Recommendations for Outpatient Follow-up:  1. Follow up with Neurology Dr. Malvin Johns in 3 weeks 2. Follow up with new PCP for follow up right knee pain as soon as possible     Home Health: Outpatient PT referral Equipment/Devices: None new  Discharge Condition: Fair  CODE STATUS: FULL Diet recommendation: Regular  Brief/Interim Summary: Randy Cordova is a 50 y.o. M with epilepsy, hx TBI, and OSA who presented with a seizure cluster and knee pain.  Patient reports frequent seizures, up to several small seizures per week.  This week, had a cluster of more frequent seizures, of greater intensity, including one that led to knee pain (injured the knee during a seizure with subsequent swelling), so he came to the ER.  In the ER, patient had 2 witnessed seizures overnight.  Loaded with Dilantin.     PRINCIPAL HOSPITAL DIAGNOSIS: Uncontrolled epilepsy with breakthrough seizures    Discharge Diagnoses:   Uncontrolled epilepsy with breakthrough seizures Patient was admitted.  Onfi was restarted.  He was continued on Trileptal and Topamax, Keppra dose was increased transiently to 2g BID.  Seizures improved.  He had no further witnessed seizures.  Patient reported seizure frequency of only once per day, without generalization by the end of his stay.  Discharged on: Trileptal 1200 BID Topamax 200 BID Keppra 1500 BID Onfi 5 nightly B6     Right knee pain due to Degenerative joint disease leading to ambulatory dysfunction Mechanical injury during seizures.    X-ray negative for fracture but shows degenerative disease and small effusion.   Pain improved to a tolerable level for household distances with rest, Ice compression NSAIDs and PT.  -Continue naproxen BID for 1 week and PT  follow up    Thrombocytopenia Stable  OSA Continue CPAP             Discharge Instructions  Discharge Instructions    Diet - low sodium heart healthy   Complete by: As directed    Discharge instructions   Complete by: As directed    From Dr. Maryfrances Bunnell:  For the knee pain: Continue rehab therapy either with the home health agency or through outpatient PT Practice the exercises every day!  For the next 5 days: Take naproxen/Aleve 440 mg (two 220 mg tabs) twice daily  I.e. take two tabs in the morning and two tabs at night  Use ice as needed for swelling Keep the compression wrap on   After that, taper your pain medication regimen down, as the muscles around the knee get stronger and the knee less painful (not completely *pain free* but less painful)   Go get a primary care doctor, because if the knee doesn't get better, you may need additional treatments   For the seizures: Take oxcarbazepine and lamotrigine without change Take Keppra as we discussed  Start the Onfi back, 5 mg nightly Watch for rashes.  If you have a new rash or blistering of the lips or mouth, stop Onfi immediately and come to the hospital   Increase activity slowly   Complete by: As directed      Allergies as of 04/23/2020      Reactions   Other Other (See Comments)   Uncoded Allergy. Allergen: ARTIFICIAL SWEETNERS   Aspartame And Phenylalanine    Headache  Medication List    TAKE these medications   cloBAZam 10 MG tablet Commonly known as: ONFI Take 0.5 tablets (5 mg total) by mouth at bedtime.   levETIRAcetam 750 MG tablet Commonly known as: KEPPRA Take 2 tablets (1,500 mg total) by mouth 2 (two) times daily. What changed: medication strength   oxcarbazepine 600 MG tablet Commonly known as: TRILEPTAL Take 2 tablets (1,200 mg total) by mouth 2 (two) times daily.   oxyCODONE 5 MG immediate release tablet Commonly known as: Oxy IR/ROXICODONE Take 1 tablet (5 mg  total) by mouth every 8 (eight) hours as needed for severe pain.   pyridoxine 200 MG tablet Commonly known as: B-6 Take 1 tablet (200 mg total) by mouth daily. Start taking on: April 24, 2020   topiramate 200 MG tablet Commonly known as: TOPAMAX Take 200 mg by mouth 2 (two) times daily.            Durable Medical Equipment  (From admission, onward)         Start     Ordered   04/20/20 1149  For home use only DME Walker rolling  Once       Comments: Bariatric more than 350 lbs  Question Answer Comment  Walker: With 5 Inch Wheels   Patient needs a walker to treat with the following condition Weakness      04/20/20 1149          Follow-up Information    Morene CrockerPotter, Zachary E, MD. Go in 3 week(s).   Specialty: Neurology Contact information: [phone] [facility] [address] [facility] [place] [id number] [phone]              Allergies  Allergen Reactions  . Other Other (See Comments)    Uncoded Allergy. Allergen: ARTIFICIAL SWEETNERS  . Aspartame And Phenylalanine     Headache    Consultations:  Neurology   Procedures/Studies: CT Head Wo Contrast  Result Date: 04/17/2020 CLINICAL DATA:  Multiple seizures, postictal EXAM: CT HEAD WITHOUT CONTRAST TECHNIQUE: Contiguous axial images were obtained from the base of the skull through the vertex without intravenous contrast. COMPARISON:  03/19/2020 FINDINGS: Brain: No acute infarct or hemorrhage. Lateral ventricles and midline structures are unremarkable. No acute extra-axial fluid collections. No mass effect. Vascular: No hyperdense vessel or unexpected calcification. Skull: Normal. Negative for fracture or focal lesion. Sinuses/Orbits: Gas fluid levels within the left sphenoid sinus. Remaining paranasal sinuses are clear. Other: None. IMPRESSION: 1. No acute intracranial process. 2. Chronic left sphenoid sinus disease. Electronically Signed   By: Sharlet SalinaMichael  Brown M.D.   On: 04/17/2020 19:13   CT  Cervical Spine Wo Contrast  Result Date: 04/17/2020 CLINICAL DATA:  Seizures, neck trauma, postictal EXAM: CT CERVICAL SPINE WITHOUT CONTRAST TECHNIQUE: Multidetector CT imaging of the cervical spine was performed without intravenous contrast. Multiplanar CT image reconstructions were also generated. COMPARISON:  03/19/2020 FINDINGS: Alignment: Alignment is anatomic. Skull base and vertebrae: No acute fracture. No primary bone lesion or focal pathologic process. Soft tissues and spinal canal: No prevertebral fluid or swelling. No visible canal hematoma. 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 There is mild diffuse cervical spondylosis. Right predominant neural foraminal encroachment is seen at C3-4 and C4-5 due to uncovertebral hypertrophy. Upper chest: Airways patent.  Lung apices are clear. Other: Reconstructed images demonstrate no additional findings. IMPRESSION: 1. Stable  multilevel cervical spondylosis and ossification of the posterior longitudinal ligament. 2. No acute cervical spine fracture. Electronically Signed   By: Sharlet Salina M.D.   On: 04/17/2020 19:16   DG Knee Complete 4 Views Right  Result Date: 04/17/2020 CLINICAL DATA:  Status post fall. EXAM: RIGHT KNEE - COMPLETE 4+ VIEW COMPARISON:  None. FINDINGS: No evidence of acute fracture or dislocation. Mild to moderate severity medial and lateral tibiofemoral compartment space narrowing is seen. Moderate severity patellofemoral narrowing is also noted. A small joint effusion is noted. IMPRESSION: Tri compartmental degenerative changes with a small joint effusion. Electronically Signed   By: Aram Candela M.D.   On: 04/17/2020 19:04   EEG adult  Result Date: 04/19/2020 Charlsie Quest, MD     04/19/2020  2:56 PM Patient Name: Randy Cordova. MRN: [id number] Epilepsy Attending:  Charlsie Quest Referring Physician/Provider: Dr Londell Moh Date: 04/19/2020 Duration: 26.49 mins Patient history: 50 year old male with multiple seizures.  EEG evaluate for seizures. Level of alertness: Awake, asleep AEDs during EEG study: Clobazam, Keppra, oxcarbazepine, topiramate Technical aspects: This EEG study was done with scalp electrodes positioned according to the 10-20 International system of electrode placement. Electrical activity was acquired at a sampling rate of  and reviewed with a high frequency filter of  and a low frequency filter of . EEG data were recorded continuously and digitally stored. Description: The posterior dominant rhythm consists of 9 Hz activity of moderate voltage (25-35 uV) seen predominantly in posterior head regions, symmetric and reactive to eye opening and eye closing. Sleep was characterized by vertex waves, sleep spindles (12 to 14 Hz), maximal frontocentral region. There is an excessive amount of 15 to 18 Hz beta activity  distributed symmetrically and diffusely. Physiologic photic driving was not seen during photic stimulation. Hyperventilation was not performed.   ABNORMALITY - Excessive beta, generalized IMPRESSION: This study is within normal limits. No seizures or epileptiform discharges were seen throughout the recording. The excessive beta activity seen in the background is most likely due to the effect of benzodiazepine and is a benign EEG pattern. Charlsie Quest   DG Hip Unilat W or Wo Pelvis 2-3 Views Right  Result Date: 04/17/2020 CLINICAL DATA:  Status post fall. EXAM: DG HIP (WITH OR WITHOUT PELVIS) 2-3V RIGHT COMPARISON:  None. FINDINGS: There is no evidence of hip fracture or dislocation. Mild degenerative changes seen in the form of joint space narrowing and acetabular sclerosis. Soft tissue structures are unremarkable. IMPRESSION: No acute osseous abnormality. Electronically Signed   By: Aram Candela M.D.   On: 04/17/2020 19:03        Subjective: Feels improved.  One small seizure while sleeping last night.  No fever, respiratory distress.  Knee pain is tolerable.  Discharge Exam: Vitals:   04/23/20 0407 04/23/20 0938  BP: 135/74 124/77  Pulse: 82 75  Resp: 18 18  Temp: 98.2 F (36.8 C) 98.1 F (36.7 C)  SpO2: 96% 100%   Vitals:   04/22/20 2200 04/23/20 0142 04/23/20 0407 04/23/20 0938  BP: (!) 143/71 133/64 135/74 124/77  Pulse: 90 89 82 75  Resp: Temp: 97.7 F (36.5 C) 97.7 F (36.5 C) 98.2 F (36.8 C) 98.1 F (36.7 C)  TempSrc: Oral Oral Oral Oral  SpO2: 96% 96% 96% 100%  Weight:      Height:        General: Pt is alert, awake, not in acute distress Cardiovascular: RRR, nl S1-S2, no  murmurs appreciated.   No LE edema.   Respiratory: Normal respiratory rate and rhythm.  CTAB without rales or wheezes. Abdominal: Abdomen soft and non-tender.  No distension or HSM.   Neuro/Psych: Strength symmetric in upper and lower extremities.  Judgment and insight appear normal.   The results of significant diagnostics from this hospitalization (including imaging, microbiology, ancillary and laboratory) are listed below for reference.     Microbiology: Recent Results (from the past 240 hour(s))  SARS CORONAVIRUS 2 (TAT 6-24 HRS) Nasopharyngeal Nasopharyngeal Swab     Status: None   Collection Time: 04/17/20  8:20 PM   Specimen: Nasopharyngeal Swab  Result Value Ref Range Status   SARS Coronavirus 2 NEGATIVE NEGATIVE Final    Comment: (NOTE) SARS-CoV-2 target nucleic acids are NOT DETECTED.  The SARS-CoV-2 RNA is generally detectable in upper and lower respiratory specimens during the acute phase of infection. Negative results do not preclude SARS-CoV-2 infection, do not rule out co-infections with other pathogens, and should not be used as the sole basis for treatment or other patient management decisions. Negative results must be combined with clinical observations, patient  history, and epidemiological information. The expected result is Negative.  Fact Sheet for Patients: HairSlick.no  Fact Sheet for Healthcare Providers: quierodirigir.com  This test is not yet approved or cleared by the [place] FDA and  has been authorized for detection and/or diagnosis of SARS-CoV-2 by FDA under an Emergency Use Authorization (EUA). This EUA will remain  in effect (meaning this test can be used) for the duration of the COVID-19 declaration under Se ction 564(b)(1) of the Act, 21 U.S.C. section 360bbb-3(b)(1), unless the authorization is terminated or revoked sooner.  Performed at [facility], [address] [place], [place] [address]      Labs: BNP (last 3 results) No results for input(s): BNP in the last 8760 hours. Basic Metabolic Panel: Recent Labs  Lab 04/17/20 1812 04/18/20 0458 04/19/20 0516 04/22/20 0518  NA 139 137 140  --   K 3.8 3.4* 3.7  --   CL 112* 112* 112*  --   CO2 20* 18* 23  --   GLUCOSE 111* 137* 106*  --   BUN 17 18 15   --   CREATININE 0.92 0.89 0.89 0.68  CALCIUM 8.7* 8.5* 8.5*  --   MG  --   --  2.1  --    Liver Function Tests: Recent Labs  Lab 04/18/20 0458  AST 20  ALT 17  ALKPHOS 76  BILITOT 0.7  PROT 6.8  ALBUMIN 3.5   No results for input(s): LIPASE, AMYLASE in the last 168 hours. No results for input(s): AMMONIA in the last 168 hours. CBC: Recent Labs  Lab 04/17/20 1812 04/18/20 0458 04/19/20 0516 04/22/20 0518  WBC 5.0 5.4 4.4 4.8  NEUTROABS  --   --  2.1  --   HGB 13.5 13.2 13.3 12.9*  HCT 40.6 40.4 39.9 38.2*  MCV 89.0 92.7 89.9 89.5  PLT 106* 103* 98* 99*   Cardiac Enzymes: No results for input(s): CKTOTAL, CKMB, CKMBINDEX, TROPONINI in the last 168 hours. BNP: Invalid input(s): POCBNP CBG: No results for input(s): GLUCAP in the last 168 hours. D-Dimer No results for input(s): DDIMER in the last 72 hours. Hgb A1c No results  for input(s): HGBA1C in the last 72 hours. Lipid Profile No results for input(s): CHOL, HDL, LDLCALC, TRIG, CHOLHDL, LDLDIRECT in the last 72 hours. Thyroid function studies No results for input(s): TSH,  T4TOTAL, T3FREE, THYROIDAB in the last 72 hours.  Invalid input(s): FREET3 Anemia work up No results for input(s): VITAMINB12, FOLATE, FERRITIN, TIBC, IRON, RETICCTPCT in the last 72 hours. Urinalysis    Component Value Date/Time   COLORURINE YELLOW (A) 03/19/2020 1817   APPEARANCEUR HAZY (A) 03/19/2020 1817   LABSPEC 1.024 03/19/2020 1817   PHURINE 5.0 03/19/2020 1817   GLUCOSEU NEGATIVE 03/19/2020 1817   HGBUR NEGATIVE 03/19/2020 1817   BILIRUBINUR NEGATIVE 03/19/2020 1817   KETONESUR NEGATIVE 03/19/2020 1817   PROTEINUR NEGATIVE 03/19/2020 1817   NITRITE NEGATIVE 03/19/2020 1817   LEUKOCYTESUR NEGATIVE 03/19/2020 1817   Sepsis Labs Invalid input(s): PROCALCITONIN,  WBC,  LACTICIDVEN Microbiology Recent Results (from the past 240 hour(s))  SARS CORONAVIRUS 2 (TAT 6-24 HRS) Nasopharyngeal Nasopharyngeal Swab     Status: None   Collection Time: 04/17/20  8:20 PM   Specimen: Nasopharyngeal Swab  Result Value Ref Range Status   SARS Coronavirus 2 NEGATIVE NEGATIVE Final    Comment: (NOTE) SARS-CoV-2 target nucleic acids are NOT DETECTED.  The SARS-CoV-2 RNA is generally detectable in upper and lower respiratory specimens during the acute phase of infection. Negative results do not preclude SARS-CoV-2 infection, do not rule out co-infections with other pathogens, and should not be used as the sole basis for treatment or other patient management decisions. Negative results must be combined with clinical observations, patient history, and epidemiological information. The expected result is Negative.  Fact Sheet for Patients: HairSlick.no  Fact Sheet for Healthcare Providers: quierodirigir.com  This test is not yet  approved or cleared by the [place] FDA and  has been authorized for detection and/or diagnosis of SARS-CoV-2 by FDA under an Emergency Use Authorization (EUA). This EUA will remain  in effect (meaning this test can be used) for the duration of the COVID-19 declaration under Se ction 564(b)(1) of the Act, 21 U.S.C. section 360bbb-3(b)(1), unless the authorization is terminated or revoked sooner.  Performed at [facility], [address], [place], [place] [address]      Time coordinating discharge: 25 minutes The [place] controlled substances registry was reviewed for this patient prior to filling the <5 days supply controlled substances script.      SIGNED:   Alberteen Sam, MD  [facility] 04/23/2020, 6:04 PM

## 2020-04-23 NOTE — Discharge Instructions (Signed)
Out-Patient Physical Therapy

## 2020-04-23 NOTE — Progress Notes (Signed)
MD order received in [facility] to discharge pt home with out patient physical therapy; TOC indicated they will set up out patient physical therapy for the pt, bariatric rolling walker previously delivered to the pt's room; verbally reviewed AVS with pt, Rxs escribed to the [facility] in [place]; no questions voiced at this time

## 2020-04-23 NOTE — Progress Notes (Signed)
DME/bariatric walker taken by his ride home to the car along with his personal belongings; Pt discharged via bariatric wheelchair by the orderly to the medical mall entrance

## 2020-04-23 NOTE — Plan of Care (Signed)
  Problem: Education: Goal: Knowledge of General Education information will improve Description: Including pain rating scale, medication(s)/side effects and non-pharmacologic comfort measures Outcome: Adequate for Discharge  04/23/20 at 1220 AVS for pt discharge verbally reviewed with the pt Problem: Clinical Measurements: Goal: Ability to maintain clinical measurements within normal limits will improve Outcome: Adequate for Discharge Goal: Will remain free from infection Outcome: Adequate for Discharge Goal: Diagnostic test results will improve Outcome: Adequate for Discharge Goal: Respiratory complications will improve Outcome: Adequate for Discharge Goal: Cardiovascular complication will be avoided Outcome: Adequate for Discharge

## 2020-06-12 ENCOUNTER — Other Ambulatory Visit: Payer: Self-pay | Admitting: Orthopedic Surgery

## 2020-06-12 DIAGNOSIS — Z9889 Other specified postprocedural states: Secondary | ICD-10-CM

## 2020-06-12 DIAGNOSIS — M1711 Unilateral primary osteoarthritis, right knee: Secondary | ICD-10-CM

## 2020-06-12 DIAGNOSIS — M2351 Chronic instability of knee, right knee: Secondary | ICD-10-CM

## 2020-06-14 ENCOUNTER — Other Ambulatory Visit: Payer: Self-pay

## 2020-06-14 ENCOUNTER — Ambulatory Visit
Admission: RE | Admit: 2020-06-14 | Discharge: 2020-06-14 | Disposition: A | Discharge status: Home / Self Care | Payer: Medicaid Other | Source: Ambulatory Visit | Attending: Orthopedic Surgery | Admitting: Orthopedic Surgery

## 2020-06-14 DIAGNOSIS — Z9889 Other specified postprocedural states: Secondary | ICD-10-CM | POA: Insufficient documentation

## 2020-06-14 DIAGNOSIS — M1711 Unilateral primary osteoarthritis, right knee: Secondary | ICD-10-CM | POA: Diagnosis present

## 2020-06-14 DIAGNOSIS — M2351 Chronic instability of knee, right knee: Secondary | ICD-10-CM | POA: Insufficient documentation

## 2020-06-30 DIAGNOSIS — Z87820 Personal history of traumatic brain injury: Secondary | ICD-10-CM | POA: Insufficient documentation

## 2020-12-04 ENCOUNTER — Inpatient Hospital Stay
Admission: EM | Admit: 2020-12-04 | Discharge: 2020-12-06 | DRG: 074 | Disposition: A | Discharge status: Home / Self Care | Payer: Medicaid Other | Attending: Family Medicine | Admitting: Family Medicine

## 2020-12-04 ENCOUNTER — Emergency Department: Payer: Medicaid Other

## 2020-12-04 ENCOUNTER — Other Ambulatory Visit: Payer: Self-pay

## 2020-12-04 ENCOUNTER — Encounter: Payer: Self-pay | Admitting: Emergency Medicine

## 2020-12-04 DIAGNOSIS — Z87820 Personal history of traumatic brain injury: Secondary | ICD-10-CM

## 2020-12-04 DIAGNOSIS — R2681 Unsteadiness on feet: Secondary | ICD-10-CM | POA: Diagnosis present

## 2020-12-04 DIAGNOSIS — X58XXXA Exposure to other specified factors, initial encounter: Secondary | ICD-10-CM | POA: Diagnosis present

## 2020-12-04 DIAGNOSIS — I6389 Other cerebral infarction: Secondary | ICD-10-CM | POA: Diagnosis not present

## 2020-12-04 DIAGNOSIS — R030 Elevated blood-pressure reading, without diagnosis of hypertension: Secondary | ICD-10-CM | POA: Diagnosis present

## 2020-12-04 DIAGNOSIS — G40909 Epilepsy, unspecified, not intractable, without status epilepticus: Secondary | ICD-10-CM | POA: Diagnosis present

## 2020-12-04 DIAGNOSIS — S8001XA Contusion of right knee, initial encounter: Secondary | ICD-10-CM | POA: Diagnosis present

## 2020-12-04 DIAGNOSIS — Z888 Allergy status to other drugs, medicaments and biological substances status: Secondary | ICD-10-CM

## 2020-12-04 DIAGNOSIS — G4733 Obstructive sleep apnea (adult) (pediatric): Secondary | ICD-10-CM | POA: Diagnosis present

## 2020-12-04 DIAGNOSIS — Z9049 Acquired absence of other specified parts of digestive tract: Secondary | ICD-10-CM | POA: Diagnosis not present

## 2020-12-04 DIAGNOSIS — R569 Unspecified convulsions: Secondary | ICD-10-CM

## 2020-12-04 DIAGNOSIS — Z20822 Contact with and (suspected) exposure to covid-19: Secondary | ICD-10-CM | POA: Diagnosis present

## 2020-12-04 DIAGNOSIS — G40919 Epilepsy, unspecified, intractable, without status epilepticus: Secondary | ICD-10-CM | POA: Diagnosis not present

## 2020-12-04 DIAGNOSIS — M4802 Spinal stenosis, cervical region: Secondary | ICD-10-CM | POA: Diagnosis present

## 2020-12-04 DIAGNOSIS — Z791 Long term (current) use of non-steroidal anti-inflammatories (NSAID): Secondary | ICD-10-CM | POA: Diagnosis not present

## 2020-12-04 DIAGNOSIS — Z79899 Other long term (current) drug therapy: Secondary | ICD-10-CM

## 2020-12-04 DIAGNOSIS — R531 Weakness: Secondary | ICD-10-CM | POA: Diagnosis present

## 2020-12-04 DIAGNOSIS — G510 Bell's palsy: Principal | ICD-10-CM | POA: Diagnosis present

## 2020-12-04 DIAGNOSIS — Z9989 Dependence on other enabling machines and devices: Secondary | ICD-10-CM | POA: Diagnosis not present

## 2020-12-04 DIAGNOSIS — Z6841 Body Mass Index (BMI) 40.0 and over, adult: Secondary | ICD-10-CM

## 2020-12-04 DIAGNOSIS — H9202 Otalgia, left ear: Secondary | ICD-10-CM | POA: Diagnosis present

## 2020-12-04 DIAGNOSIS — G8194 Hemiplegia, unspecified affecting left nondominant side: Secondary | ICD-10-CM

## 2020-12-04 DIAGNOSIS — G40019 Localization-related (focal) (partial) idiopathic epilepsy and epileptic syndromes with seizures of localized onset, intractable, without status epilepticus: Secondary | ICD-10-CM | POA: Diagnosis not present

## 2020-12-04 LAB — CBC
HCT: 38.6 % — ABNORMAL LOW (ref 39.0–52.0)
Hemoglobin: 13.5 g/dL (ref 13.0–17.0)
MCH: 31.2 pg (ref 26.0–34.0)
MCHC: 35 g/dL (ref 30.0–36.0)
MCV: 89.1 fL (ref 80.0–100.0)
Platelets: 97 10*3/uL — ABNORMAL LOW (ref 150–400)
RBC: 4.33 MIL/uL (ref 4.22–5.81)
RDW: 13.3 % (ref 11.5–15.5)
WBC: 4.9 10*3/uL (ref 4.0–10.5)
nRBC: 0 % (ref 0.0–0.2)

## 2020-12-04 LAB — COMPREHENSIVE METABOLIC PANEL
ALT: 26 U/L (ref 0–44)
AST: 25 U/L (ref 15–41)
Albumin: 3.7 g/dL (ref 3.5–5.0)
Alkaline Phosphatase: 83 U/L (ref 38–126)
Anion gap: 6 (ref 5–15)
BUN: 17 mg/dL (ref 6–20)
CO2: 26 mmol/L (ref 22–32)
Calcium: 8.6 mg/dL — ABNORMAL LOW (ref 8.9–10.3)
Chloride: 105 mmol/L (ref 98–111)
Creatinine, Ser: 0.75 mg/dL (ref 0.61–1.24)
GFR, Estimated: >60 mL/min (ref >60)
Glucose, Bld: 100 mg/dL — ABNORMAL HIGH (ref 70–99)
Potassium: 4.1 mmol/L (ref 3.5–5.1)
Sodium: 137 mmol/L (ref 135–145)
Total Bilirubin: 0.7 mg/dL (ref 0.3–1.2)
Total Protein: 7 g/dL (ref 6.5–8.1)

## 2020-12-04 LAB — DIFFERENTIAL
Abs Immature Granulocytes: 0.02 10*3/uL (ref 0.00–0.07)
Basophils Absolute: 0 10*3/uL (ref 0.0–0.1)
Basophils Relative: 1 %
Eosinophils Absolute: 0.1 10*3/uL (ref 0.0–0.5)
Eosinophils Relative: 1 %
Immature Granulocytes: 0 %
Lymphocytes Relative: 41 %
Lymphs Abs: 2 10*3/uL (ref 0.7–4.0)
Monocytes Absolute: 0.3 10*3/uL (ref 0.1–1.0)
Monocytes Relative: 6 %
Neutro Abs: 2.5 10*3/uL (ref 1.7–7.7)
Neutrophils Relative %: 51 %

## 2020-12-04 LAB — PROTIME-INR
INR: 1.1 (ref 0.8–1.2)
Prothrombin Time: 13.8 seconds (ref 11.4–15.2)

## 2020-12-04 LAB — APTT: aPTT: 26 seconds (ref 24–36)

## 2020-12-04 LAB — CBG MONITORING, ED: Glucose-Capillary: 97 mg/dL (ref 70–99)

## 2020-12-04 LAB — RESP PANEL BY RT-PCR (FLU A&B, COVID) ARPGX2
Influenza A by PCR: NEGATIVE
Influenza B by PCR: NEGATIVE
SARS Coronavirus 2 by RT PCR: NEGATIVE

## 2020-12-04 MED ORDER — SODIUM CHLORIDE 0.9 % IV SOLN: INTRAVENOUS | Status: DC

## 2020-12-04 MED ORDER — ORAL CARE MOUTH RINSE
15.0000 mL | OROMUCOSAL | Status: DC
  Filled 2020-12-04 (×21): qty 15

## 2020-12-04 MED ORDER — LORAZEPAM 2 MG/ML IJ SOLN: 1.0000 mg | INTRAMUSCULAR | Status: DC | PRN

## 2020-12-04 MED ORDER — STROKE: EARLY STAGES OF RECOVERY BOOK
Freq: Once | Status: AC
Start: 2020-12-04 — End: 2020-12-04

## 2020-12-04 MED ORDER — ACETAMINOPHEN 325 MG PO TABS
650.0000 mg | ORAL_TABLET | ORAL | Status: DC | PRN
  Administered 2020-12-05 (×2): 650 mg via ORAL
  Filled 2020-12-04 (×2): qty 2

## 2020-12-04 MED ORDER — ACETAMINOPHEN 325 MG RE SUPP: 650.0000 mg | RECTAL | Status: DC | PRN

## 2020-12-04 MED ORDER — ONDANSETRON HCL 4 MG/2ML IJ SOLN
4.0000 mg | Freq: Four times a day (QID) | INTRAMUSCULAR | Status: DC | PRN
Start: 2020-12-04 — End: 2020-12-06

## 2020-12-04 MED ORDER — ONDANSETRON HCL 4 MG PO TABS
4.0000 mg | ORAL_TABLET | Freq: Four times a day (QID) | ORAL | Status: DC | PRN
Start: 2020-12-04 — End: 2020-12-06

## 2020-12-04 MED ORDER — KETOROLAC TROMETHAMINE 30 MG/ML IJ SOLN
15.0000 mg | Freq: Once | INTRAMUSCULAR | Status: AC
  Administered 2020-12-04: 15 mg via INTRAVENOUS
  Filled 2020-12-04: qty 1

## 2020-12-04 MED ORDER — ACETAMINOPHEN 160 MG/5ML PO SOLN
650.0000 mg | ORAL | Status: DC | PRN
  Filled 2020-12-04: qty 20.3

## 2020-12-04 MED ORDER — OXCARBAZEPINE 300 MG PO TABS
1200.0000 mg | ORAL_TABLET | Freq: Two times a day (BID) | ORAL | Status: DC
  Administered 2020-12-04 – 2020-12-06 (×4): 1200 mg via ORAL
  Filled 2020-12-04 (×4): qty 4

## 2020-12-04 MED ORDER — IOHEXOL 350 MG/ML SOLN
75.0000 mL | Freq: Once | INTRAVENOUS | Status: AC | PRN
  Administered 2020-12-04: 75 mL via INTRAVENOUS

## 2020-12-04 MED ORDER — ACETAMINOPHEN 325 MG PO TABS: 650.0000 mg | ORAL_TABLET | ORAL | Status: DC | PRN

## 2020-12-04 MED ORDER — VITAMIN B-6 50 MG PO TABS
200.0000 mg | ORAL_TABLET | Freq: Every day | ORAL | Status: DC
  Administered 2020-12-04 – 2020-12-05 (×2): 200 mg via ORAL
  Filled 2020-12-04 (×2): qty 4

## 2020-12-04 MED ORDER — TOPIRAMATE 25 MG PO TABS
200.0000 mg | ORAL_TABLET | Freq: Two times a day (BID) | ORAL | Status: DC
  Filled 2020-12-04 (×2): qty 8

## 2020-12-04 MED ORDER — CLOBAZAM 10 MG PO TABS
20.0000 mg | ORAL_TABLET | Freq: Every day | ORAL | Status: DC
  Filled 2020-12-04 (×3): qty 2

## 2020-12-04 MED ORDER — SODIUM CHLORIDE 0.9% FLUSH
3.0000 mL | Freq: Once | INTRAVENOUS | Status: AC
  Administered 2020-12-04: 3 mL via INTRAVENOUS

## 2020-12-04 MED ORDER — ENOXAPARIN SODIUM 100 MG/ML IJ SOSY
0.5000 mg/kg | PREFILLED_SYRINGE | INTRAMUSCULAR | Status: DC
  Administered 2020-12-04 – 2020-12-05 (×2): 97.5 mg via SUBCUTANEOUS
  Filled 2020-12-04 (×3): qty 0.97

## 2020-12-04 MED ORDER — ACETAMINOPHEN 500 MG PO TABS
1000.0000 mg | ORAL_TABLET | Freq: Once | ORAL | Status: DC
  Filled 2020-12-04: qty 2

## 2020-12-04 MED ORDER — LEVETIRACETAM 500 MG PO TABS
1500.0000 mg | ORAL_TABLET | Freq: Two times a day (BID) | ORAL | Status: DC
  Administered 2020-12-04 – 2020-12-06 (×4): 1500 mg via ORAL
  Filled 2020-12-04 (×4): qty 3

## 2020-12-04 MED ORDER — SODIUM CHLORIDE 0.9 % IV SOLN
75.0000 mL/h | INTRAVENOUS | Status: DC
  Administered 2020-12-04: 75 mL/h via INTRAVENOUS

## 2020-12-04 MED ORDER — CHLORHEXIDINE GLUCONATE 0.12% ORAL RINSE (MEDLINE KIT)
15.0000 mL | Freq: Two times a day (BID) | OROMUCOSAL | Status: DC
  Filled 2020-12-04 (×7): qty 15

## 2020-12-04 MED ORDER — ASPIRIN EC 325 MG PO TBEC
650.0000 mg | DELAYED_RELEASE_TABLET | Freq: Once | ORAL | Status: AC
  Administered 2020-12-04: 650 mg via ORAL
  Filled 2020-12-04: qty 2

## 2020-12-04 NOTE — ED Notes (Signed)
NADN  

## 2020-12-04 NOTE — Consult Note (Addendum)
NEURO HOSPITALIST CONSULT NOTE   Requestig physician: Dr. Katrinka Blazing  Reason for Consult: Acute onset of left sided weakness  History obtained from:   Patient and Chart     HPI:                                                                                                                                          Randy Fouche. is an 50 y.o. male with a history of medically refractory epilepsy, supermorbid obesity, OSA on CPAP, TBI presenting acutely via EMS for evaluation of left sided weakness noticed after he had recurrence of seizure activity today. The patient endorses that he has on average 8 seizures per month but that he had more seizures than usual over the weekend: 1 on Friday, 3 on Saturday, none on Sunday and 1 today. The seizures were GTC and lasted about 2-3 minutes each. He states that after the seizure today, he noticed left arm and leg weakness as well as left face weakness manifesting as food dribbling out the left side of his mouth when eating.   The patient was transported to the ED via EMS. Code Stroke was called in the field. He was initially encoded with an LKN of 1500. On further interview, the patient reveals that LKN was 2 PM prior to the seizure.   Past Medical History:  Diagnosis Date   Epilepsy (HCC)    OSA on CPAP    TBI (traumatic brain injury)    Thrombocytopenia (HCC)     Past Surgical History:  Procedure Laterality Date   BACK SURGERY     CHOLECYSTECTOMY     Head surgery     Due to brain injury   KNEE SURGERY      Family History  Problem Relation Age of Onset   Arthritis/Rheumatoid Mother              Social History:  reports that he has never smoked. He has never used smokeless tobacco. He reports current alcohol use. He reports that he does not use drugs.  Allergies  Allergen Reactions   Other Other (See Comments)    Uncoded Allergy. Allergen: ARTIFICIAL SWEETNERS   Aspartame And Phenylalanine     Headache     MEDICATIONS:  No current facility-administered medications on file prior to encounter.   Current Outpatient Medications on File Prior to Encounter  Medication Sig Dispense Refill   cloBAZam (ONFI) 10 MG tablet Take 0.5 tablets (5 mg total) by mouth at bedtime. 15 tablet 2   levETIRAcetam (KEPPRA) 750 MG tablet Take 2 tablets (1,500 mg total) by mouth 2 (two) times daily. 120 tablet 2   Oxcarbazepine (TRILEPTAL) 600 MG tablet Take 2 tablets (1,200 mg total) by mouth 2 (two) times daily. 120 tablet 0   oxyCODONE (OXY IR/ROXICODONE) 5 MG immediate release tablet Take 1 tablet (5 mg total) by mouth every 8 (eight) hours as needed for severe pain. 8 tablet 0   pyridOXINE (B-6) 200 MG tablet Take 1 tablet (200 mg total) by mouth daily. 30 tablet 1   topiramate (TOPAMAX) 200 MG tablet Take 200 mg by mouth 2 (two) times daily.        ROS:                                                                                                                                       The patient endorses having had a headache ongoing for the past 2 days. No cough, CP, fever or abdominal pain. No diarrhea or vomiting. Endorses feeling dizzy (like with 5 shots of Tequila, per patient) with a vertiginous and wobbly component. Had difficulty ambulating today after onset of the left sided weakness due to the dizziness, per patient. Other ROS as per HPI.    Blood pressure (!) 154/67, pulse 77, temperature 98.2 F (36.8 C), temperature source Oral, resp. rate 20, height 6' (1.829 m), weight (!) 195 kg, SpO2 100 %.   General Examination:                                                                                                       Physical Exam  General: Supermorbid obesity HEENT-  Normocephalic.    Lungs- Intermittent panting during exam without grossly audible wheezes or  rhonchi Extremities- Warm and well perfused  Neurological Examination Mental Status: Awake and alert. Speech is fluent and nondysarthric with intact comprehension and naming. Able to follow all commands without difficulty. Cranial Nerves: II: Temporal visual fields intact with no extinction to DSS. PERRL.  III,IV, VI: No ptosis. EOMI. No nystagmus.   V: Temp sensation equal bilaterally  VII: Left perioral region with variable decreased contraction when asked to smile by two separate examiners. There is quivering of the left  side of the mouth when asked to smile. VIII: Hearing intact to voice IX,X: No hypophonia or hoarseness XI: Head is midline.  XII: Tongue protrudes slightly to the right of midline on initial assessment. On repeat assessment, the patient is asked to protrude tongue and wag back and forth - with this trial tongue deviates sharply to the right, which appears volitional; then wags a few mm back and forth from the sharply deviated position.  Motor: RUE 5/5 RLE 5/5 LUE with bobbing drift that varies in amplitude and frequency and is distractible. Max strength against resistance is 4/5 LLE with varying ability to lift LLE when assessed by two separate examiners and with variable resistance on separate assessments. Max strength against resistance is 4+/5 Sensory: Subjectively decreased temp sensation to LUE and LLE. FT intact x 4. States he can only feel the right when testing for DSS.  Deep Tendon Reflexes: 2+ and symmetric throughout Plantars: Right: downgoing   Left: downgoing Cerebellar: No ataxia with FNF bilaterally  Gait: Deferred   Lab Results: Basic Metabolic Panel: No results for input(s): NA, K, CL, CO2, GLUCOSE, BUN, CREATININE, CALCIUM, MG, PHOS in the last 168 hours.  CBC: No results for input(s): WBC, NEUTROABS, HGB, HCT, MCV, PLT in the last 168 hours.  Cardiac Enzymes: No results for input(s): CKTOTAL, CKMB, CKMBINDEX, TROPONINI in the last 168  hours.  Lipid Panel: No results for input(s): CHOL, TRIG, HDL, CHOLHDL, VLDL, LDLCALC in the last 168 hours.  Imaging: CT HEAD CODE STROKE WO CONTRAST  Result Date: 12/04/2020 CLINICAL DATA:  Code stroke.  Acute neurologic deficit EXAM: CT HEAD WITHOUT CONTRAST TECHNIQUE: Contiguous axial images were obtained from the base of the skull through the vertex without intravenous contrast. COMPARISON:  None. FINDINGS: Brain: There is no mass, hemorrhage or extra-axial collection. The size and configuration of the ventricles and extra-axial CSF spaces are normal. The brain parenchyma is normal, without evidence of acute or chronic infarction. Vascular: No abnormal hyperdensity of the major intracranial arteries or dural venous sinuses. No intracranial atherosclerosis. Skull: The visualized skull base, calvarium and extracranial soft tissues are normal. Sinuses/Orbits: No fluid levels or advanced mucosal thickening of the visualized paranasal sinuses. No mastoid or middle ear effusion. The orbits are normal. ASPECTS [facility] Stroke Program Early CT Score) - Ganglionic level infarction (caudate, lentiform nuclei, internal capsule, insula, M1-M3 cortex): 7 - Supraganglionic infarction (M4-M6 cortex): 3 Total score (0-10 with 10 being normal): 10 IMPRESSION: 1. Normal head CT. 2. ASPECTS is 10. These results were called by telephone at the time of interpretation on 12/04/2020 at 6:46 pm to provider [facility] , who verbally acknowledged these results. Electronically Signed   By: Deatra Robinson M.D.   On: 12/04/2020 18:46     Assessment/Recommendations: 50 year old male with a history of epilepsy presenting with acute onset of left sided weakness following a seizure at home.   Acute onset of left sidedweakness:  -Exam reveals bobbing drift of LUE, varying ability to lift LLE when assessed by two separate examiners, variable resistance on testing of LUE and LLE strength, sharp deviation of tongue to the right  when testing protrusion (expected direction would be towards the side of the weakness if due to a CNS lesion) and variable weakness of left side of face on separate examinations.  -DDx for presentation includes functional/conversion disorder/secondary gain, postictal weakness. Also possible but significantly less likely would be an acute ischemic infarction.  -After discussion of risks/benefits of TNK, the patient stated  that he did not feel that the risks justified the potential benefits and declined TNK.   Recommendations:  - STAT CTA of head and neck (ordered). Verify that renal function is normal prior to scanning.   - MRI brain after vessel imaging  - ASA 650 mg po x 1 now, crushed (ordered)  - Overall exam findings not consistent with LVO. Not a thrombectomy candidate.   Increased seizure frequency at home: - History of epilepsy.  - Takes Onfi, Keppra and Oxcarbazepine at home - DDx for increased seizure frequency includes increased metabolism of meds versus poor absorption versus and intrinsic change in seizure frequency - Being followed at an outside facility for this diagnosis. Per patient, he recently underwent functional MRI and is being worked up for possible epilepsy surgery.   Recommendations:  - Keppra level (ordered)  - Oxcarbazepine metabolite level not available in [facility] labs listing in Epic  - Seizure precautions  - EEG in AM (ordered).   - Continue home Onfi, Keppra and oxcarbazepine  - Patient also on Topamax 200 mg BID at home per Epic list. He did not list this when verbally asked what his anticonvulsants are. For now, continue and obtain more information in the AM on whether this medication was stopped at some point, or whether the patient has had difficulty fully recalling his medications.   - Also on pyridoxine at home, per Epic. This has been reordered.     Electronically signed: Dr. Caryl Pina 12/04/2020, 7:20 PM

## 2020-12-04 NOTE — H&P (Signed)
History and Physical    Randy Cordova. FXT:[id number] DOB: 08/29/1970 DOA: 12/04/2020  PCP: Pcp, No   Patient coming from: Home  I have personally briefly reviewed patient's relevant medical records in [facility]  Chief Complaint: Left-sided weakness  HPI: Randy Cordova. is a 50 y.o. 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  His last known well of 1500.  He complained of associated headache, unsteadiness on his feet and some mild soreness to the right knee.    He reports compliance with his antiepileptic medication.  Apart from his breakthrough seizures he denies other recent illness such as fever or chills, cough or shortness of breath or chest pain and denies nausea vomiting or diarrhea.  Following his code stroke work-up and on my assessment, patient complained of numbness on his left hemiface with difficulty shutting his left eye and drooling from the left side of his mouth.    ED and code stroke course: On arrival, vitals unremarkable except for slightly elevated blood pressure of 154/67 CT head negative for acute hemorrhage or stroke CTA head and neck with no LVO or high-grade stenosis Teleneurology assessment: Discussed tPA with patient who declined intervention(per review of note) MRI ordered Hospitalist consulted for admission  Blood work: Unremarkable.  Levels of antiepileptics pending  EKG: Sinus rhythm at 76 with no acute ST-T wave changes  Hospitalist consulted for admission    Review of Systems: As per HPI otherwise all other systems on review of systems negative.    Past Medical History:  Diagnosis Date   Epilepsy (HCC)    OSA on CPAP    TBI (traumatic brain injury)     Thrombocytopenia (HCC)     Past Surgical History:  Procedure Laterality Date   BACK SURGERY     CHOLECYSTECTOMY     Head surgery     Due to brain injury   KNEE SURGERY       reports that he has never smoked. He has never used smokeless tobacco. He reports current alcohol use. He reports that he does not use drugs.  Allergies  Allergen Reactions   Other Other (See Comments)    Uncoded Allergy. Allergen: ARTIFICIAL SWEETNERS   Aspartame And Phenylalanine     Headache    Family History  Problem Relation Age of Onset   Arthritis/Rheumatoid Mother       Prior to Admission medications   Medication Sig Start Date End Date Taking? Authorizing Provider  cloBAZam (ONFI) 20 MG tablet Take 1 tablet by mouth at bedtime. 10/10/20  Yes [provider]  levETIRAcetam (KEPPRA) 750 MG tablet Take 2 tablets (1,500 mg total) by mouth 2 (two) times daily. 04/23/20  Yes Danford, Earl Lites, MD  meloxicam (MOBIC) 15 MG tablet Take 15 mg by mouth daily. 11/10/20  Yes [provider]  oxcarbazepine (TRILEPTAL) 600 MG tablet Take 2 tablets by mouth in the morning and at bedtime. 08/17/20 08/17/21 Yes [provider]  cloBAZam (ONFI) 10 MG tablet Take 0.5 tablets (5 mg total) by mouth at bedtime. 04/23/20 07/22/20  Alberteen Sam, MD  Oxcarbazepine (TRILEPTAL) 600 MG tablet Take 2 tablets (1,200 mg total) by mouth 2 (two) times daily. 03/20/20 04/19/20  Jerald Kief,  MD  oxyCODONE (OXY IR/ROXICODONE) 5 MG immediate release tablet Take 1 tablet (5 mg total) by mouth every 8 (eight) hours as needed for severe pain. Patient not taking: No sig reported 04/23/20   Alberteen Sam, MD  pyridOXINE (B-6) 200 MG tablet Take 1 tablet (200 mg total) by mouth daily. 04/24/20   Danford, Earl Lites, MD  topiramate (TOPAMAX) 200 MG tablet Take 200 mg by mouth 2 (two) times daily.    [provider]    Physical Exam: Vitals:   12/04/20 1855 12/04/20 1856 12/04/20 1922  12/04/20 2024  BP: (!) 154/67  (!) 145/69 138/78  Pulse: 77  76 76  Resp: 20  14 20   Temp: 98.2 F (36.8 C)     TempSrc: Oral     SpO2: 100%  97% 95%  Weight:  (!) 195 kg    Height:  6' (1.829 m)     Constitutional: Alert and oriented x 3 . Not in any apparent distress HEENT: Left facial droop with absence of frown lines      Head: Normocephalic and atraumatic.         Eyes: PERLA, EOMI, Conjunctivae are normal. Sclera is non-icteric.       Mouth/Throat: Mucous membranes are moist.       Neck: Supple with no signs of meningismus. Cardiovascular: Regular rate and rhythm. No murmurs, gallops, or rubs. 2+ symmetrical distal pulses are present . No JVD. No  LE edema Respiratory: Respiratory effort normal .Lungs sounds clear bilaterally. No wheezes, crackles, or rhonchi.  Gastrointestinal: Soft, non tender, non distended. Positive bowel sounds.  Genitourinary: No CVA tenderness. Musculoskeletal: Nontender with normal range of motion in all extremities. No cyanosis, or erythema of extremities. Neurologic: Left facial droop. Moving all extremities. No gross focal neurologic deficits of the extremities Skin: Skin is warm, dry.  No rash or ulcers Psychiatric: Mood and affect are appropriate    Labs on Admission: I have personally reviewed following labs and imaging studies  CBC: Recent Labs  Lab 12/04/20 1912  WBC 4.9  NEUTROABS 2.5  HGB 13.5  HCT 38.6*  MCV 89.1  PLT 97*   Basic Metabolic Panel: Recent Labs  Lab 12/04/20 1912  NA 137  K 4.1  CL 105  CO2 26  GLUCOSE 100*  BUN 17  CREATININE 0.75  CALCIUM 8.6*   GFR: Estimated Creatinine Clearance: 196.9 mL/min (by C-G formula based on SCr of 0.75 mg/dL). Liver Function Tests: Recent Labs  Lab 12/04/20 1912  AST 25  ALT 26  ALKPHOS 83  BILITOT 0.7  PROT 7.0  ALBUMIN 3.7   No results for input(s): LIPASE, AMYLASE in the last 168 hours. No results for input(s): AMMONIA in the last 168 hours. Coagulation  Profile: Recent Labs  Lab 12/04/20 1912  INR 1.1   Cardiac Enzymes: No results for input(s): CKTOTAL, CKMB, CKMBINDEX, TROPONINI in the last 168 hours. BNP (last 3 results) No results for input(s): PROBNP in the last 8760 hours. HbA1C: No results for input(s): HGBA1C in the last 72 hours. CBG: Recent Labs  Lab 12/04/20 1829  GLUCAP 97   Lipid Profile: No results for input(s): CHOL, HDL, LDLCALC, TRIG, CHOLHDL, LDLDIRECT in the last 72 hours. Thyroid Function Tests: No results for input(s): TSH, T4TOTAL, FREET4, T3FREE, THYROIDAB in the last 72 hours. Anemia Panel: No results for input(s): VITAMINB12, FOLATE, FERRITIN, TIBC, IRON, RETICCTPCT in the last 72 hours. Urine analysis:    Component Value Date/Time   COLORURINE YELLOW (  A) 03/19/2020 1817   APPEARANCEUR HAZY (A) 03/19/2020 1817   LABSPEC 1.024 03/19/2020 1817   PHURINE 5.0 03/19/2020 1817   GLUCOSEU NEGATIVE 03/19/2020 1817   HGBUR NEGATIVE 03/19/2020 1817   BILIRUBINUR NEGATIVE 03/19/2020 1817   KETONESUR NEGATIVE 03/19/2020 1817   PROTEINUR NEGATIVE 03/19/2020 1817   NITRITE NEGATIVE 03/19/2020 1817   LEUKOCYTESUR NEGATIVE 03/19/2020 1817    Radiological Exams on Admission: DG Knee Complete 4 Views Right  Result Date: 12/04/2020 CLINICAL DATA:  Right knee pain EXAM: RIGHT KNEE - COMPLETE 4+ VIEW COMPARISON:  04/17/2020 FINDINGS: Degenerative changes throughout the right knee with joint space narrowing and spurring, most pronounced in the medial and patellofemoral compartments. No joint effusion. No acute bony abnormality. Specifically, no fracture, subluxation, or dislocation. IMPRESSION: Moderate degenerative changes.  No acute bony abnormality. Electronically Signed   By: Charlett Nose M.D.   On: 12/04/2020 19:42   CT HEAD CODE STROKE WO CONTRAST  Result Date: 12/04/2020 CLINICAL DATA:  Code stroke.  Acute neurologic deficit EXAM: CT HEAD WITHOUT CONTRAST TECHNIQUE: Contiguous axial images were obtained from  the base of the skull through the vertex without intravenous contrast. COMPARISON:  None. FINDINGS: Brain: There is no mass, hemorrhage or extra-axial collection. The size and configuration of the ventricles and extra-axial CSF spaces are normal. The brain parenchyma is normal, without evidence of acute or chronic infarction. Vascular: No abnormal hyperdensity of the major intracranial arteries or dural venous sinuses. No intracranial atherosclerosis. Skull: The visualized skull base, calvarium and extracranial soft tissues are normal. Sinuses/Orbits: No fluid levels or advanced mucosal thickening of the visualized paranasal sinuses. No mastoid or middle ear effusion. The orbits are normal. ASPECTS [facility] Stroke Program Early CT Score) - Ganglionic level infarction (caudate, lentiform nuclei, internal capsule, insula, M1-M3 cortex): 7 - Supraganglionic infarction (M4-M6 cortex): 3 Total score (0-10 with 10 being normal): 10 IMPRESSION: 1. Normal head CT. 2. ASPECTS is 10. These results were called by telephone at the time of interpretation on 12/04/2020 at 6:46 pm to provider [facility] , who verbally acknowledged these results. Electronically Signed   By: Deatra Robinson M.D.   On: 12/04/2020 18:46   CT ANGIO HEAD NECK W WO CM (CODE STROKE)  Result Date: 12/04/2020 CLINICAL DATA:  Left-sided weakness EXAM: CT ANGIOGRAPHY HEAD AND NECK TECHNIQUE: Multidetector CT imaging of the head and neck was performed using the standard protocol during bolus administration of intravenous contrast. Multiplanar CT image reconstructions and MIPs were obtained to evaluate the vascular anatomy. Carotid stenosis measurements (when applicable) are obtained utilizing NASCET criteria, using the distal internal carotid diameter as the denominator. CONTRAST:  66mL OMNIPAQUE IOHEXOL 350 MG/ML SOLN COMPARISON:  None. 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 OTHER NECK: Normal pharynx, larynx and major salivary glands. No cervical lymphadenopathy. Unremarkable thyroid gland. UPPER CHEST: No pneumothorax or pleural effusion. No nodules or masses. AORTIC ARCH: There is no calcific atherosclerosis of the aortic arch. There is no aneurysm, dissection or hemodynamically significant stenosis of the visualized portion of the aorta. Conventional 3 vessel aortic branching pattern. The visualized proximal subclavian arteries are widely patent. RIGHT CAROTID SYSTEM: Normal without aneurysm, dissection or stenosis. LEFT CAROTID SYSTEM: Normal without aneurysm, dissection or stenosis. VERTEBRAL ARTERIES: Left dominant configuration. Both origins are clearly patent. There is no dissection, occlusion or flow-limiting stenosis to the skull base (V1-V3 segments). CTA HEAD FINDINGS POSTERIOR CIRCULATION: --Vertebral arteries: Normal V4  segments. --Inferior cerebellar arteries: Normal. --Basilar artery: Normal. --Superior cerebellar arteries: Normal. --Posterior cerebral arteries (PCA): Normal. ANTERIOR CIRCULATION: --Intracranial internal carotid arteries: Normal. --Anterior cerebral arteries (ACA): Normal. Both A1 segments are present. Patent anterior communicating artery (a-comm). --Middle cerebral arteries (MCA): Normal. VENOUS SINUSES: As permitted by contrast timing, patent. ANATOMIC VARIANTS: None Review of the MIP images confirms the above findings. IMPRESSION: 1. No emergent large vessel occlusion or high-grade stenosis of the intracranial or cervical arteries. 2. 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 Electronically Signed   By: Deatra Robinson M.D.   On: 12/04/2020 20:11    Assessment/Plan    Acute left hemiparesis/suspect acute CVA - Patient presenting with left-sided hemiparesis on awaking from arrival within tPA window but declining per  teleneurology note -CT head negative for stroke and CTA head with no LVO or significant stenosis - Differential includes Todd's paralysis given relation to seizure, given right hemiparesis weakness Bell's palsy also a consideration but will treat as acute stroke for now - Follow MRI - Full stroke work-up with continuous cardiac monitoring to evaluate for arrhythmias, echocardiogram - Neurologic checks with aspiration precautions - HbA1c, lipid profile and TSH - Aspirin and atorvastatin for now - Neurology consult to follow-up for additional recommendations    Breakthrough seizures, recurrent (HCC)   Epilepsy (HCC) - Teleneurology consult appreciated - Follow Keppra level - Continue home Keppra and Trileptal and oxcarbazepine - EEG in the a.m. - Neurology consult to follow    OSA on CPAP - CPAP nightly    Morbid obesity with BMI of 50.0-59.9, adult -complicating factor to overall prognosis and care - Aspiration precautions given previously described seizure frequency    DVT prophylaxis: Lovenox  Code Status: full code  Family Communication:  none  Disposition Plan: Back to previous home environment Consults called: neurology  Status:At the time of admission, it appears that the appropriate admission status for this patient is INPATIENT. This is judged to be reasonable and necessary in order to provide the required intensity of service to ensure the patient's safety given the presenting symptoms, physical exam findings, and initial radiographic and laboratory data in the context of their  Comorbid conditions.   Patient requires inpatient status due to high intensity of service, high risk for further deterioration and high frequency of surveillance required.   I certify that at the point of admission it is my clinical judgment that the patient will require inpatient hospital care spanning beyond 2 midnights    Andris Baumann MD [facility]   12/04/2020, 9:02 PM

## 2020-12-04 NOTE — ED Notes (Signed)
Pt move to hospital bed.

## 2020-12-04 NOTE — ED Notes (Signed)
Pt took po meds with apple sauce. Then passed swallow screen with fluids

## 2020-12-04 NOTE — ED Notes (Signed)
Pt arrived to CT. 

## 2020-12-04 NOTE — ED Notes (Signed)
MRI on phone with pt.

## 2020-12-04 NOTE — ED Notes (Signed)
Pt to MRI

## 2020-12-04 NOTE — ED Provider Notes (Signed)
[facility] Emergency Department Provider Note  ____________________________________________   Event Date/Time   First MD Initiated Contact with Patient 12/04/20 1831     (approximate)  I have reviewed the triage vital signs and the nursing notes.   HISTORY  Chief Complaint Code Stroke   HPI Randy Cordova. is a 50 y.o. male with a past medical history of thrombocytopenia, TBI, OSA on CPAP at night and epilepsy who presents EMS for assessment of some left-sided facial droop and left arm and leg weakness as well as some slurred speech and headache patient states started after a seizure today around 2 PM.  Stated he woke up feeling like this way and was last well around 1500.  Patient states he has had 8 seizures in the past month and 5 over the last couple of days.  He endorses some mild soreness in his right knee and a little bit of a headache.  He denies any fevers, cough, chest pain, abdominal pain, vomiting, diarrhea, burning with urination, change in medication, missed antiepileptic medication dosages, EtOH use, illicit drug use or other acute concerns at this time.  He states he thinks he was laying on his left side of his body for some time today and is not sure if it is why he is weak and has decreased sensation.         Past Medical History:  Diagnosis Date   Epilepsy (HCC)    OSA on CPAP    TBI (traumatic brain injury)    Thrombocytopenia (HCC)     Patient Active Problem List   Diagnosis Date Noted   Epilepsy (HCC) 12/04/2020   Acute left hemiparesis (HCC) 12/04/2020   Grand mal seizure (HCC) 04/18/2020   Breakthrough seizures, recurrent (HCC) 03/19/2020   Thrombocytopenia (HCC)    OSA on CPAP     Past Surgical History:  Procedure Laterality Date   BACK SURGERY     CHOLECYSTECTOMY     Head surgery     Due to brain injury   KNEE SURGERY      Prior to Admission medications   Medication Sig Start Date End Date Taking? Authorizing  Provider  cloBAZam (ONFI) 20 MG tablet Take 1 tablet by mouth at bedtime. 10/10/20  Yes [provider]  levETIRAcetam (KEPPRA) 750 MG tablet Take 2 tablets (1,500 mg total) by mouth 2 (two) times daily. 04/23/20  Yes Danford, Earl Lites, MD  meloxicam (MOBIC) 15 MG tablet Take 15 mg by mouth daily. 11/10/20  Yes [provider]  oxcarbazepine (TRILEPTAL) 600 MG tablet Take 2 tablets by mouth in the morning and at bedtime. 08/17/20 08/17/21 Yes [provider]  cloBAZam (ONFI) 10 MG tablet Take 0.5 tablets (5 mg total) by mouth at bedtime. 04/23/20 07/22/20  Alberteen Sam, MD  Oxcarbazepine (TRILEPTAL) 600 MG tablet Take 2 tablets (1,200 mg total) by mouth 2 (two) times daily. 03/20/20 04/19/20  Jerald Kief, MD  oxyCODONE (OXY IR/ROXICODONE) 5 MG immediate release tablet Take 1 tablet (5 mg total) by mouth every 8 (eight) hours as needed for severe pain. Patient not taking: No sig reported 04/23/20   Alberteen Sam, MD  pyridOXINE (B-6) 200 MG tablet Take 1 tablet (200 mg total) by mouth daily. 04/24/20   Danford, Earl Lites, MD  topiramate (TOPAMAX) 200 MG tablet Take 200 mg by mouth 2 (two) times daily.    [provider]    Allergies Other and Aspartame and phenylalanine  Family  History  Problem Relation Age of Onset   Arthritis/Rheumatoid Mother     Social History Social History   Tobacco Use   Smoking status: Never   Smokeless tobacco: Never  Vaping Use   Vaping Use: Every day  Substance Use Topics   Alcohol use: Yes    Comment: rarely   Drug use: Never    Review of Systems  Review of Systems  Constitutional:  Negative for chills and fever.  HENT:  Negative for sore throat.   Eyes:  Negative for pain.  Respiratory:  Negative for cough and stridor.   Cardiovascular:  Negative for chest pain.  Gastrointestinal:  Negative for vomiting.  Genitourinary:  Negative for dysuria.  Musculoskeletal:  Negative for myalgias.   Skin:  Negative for rash.  Neurological:  Positive for speech change, focal weakness (L hemibody), seizures, loss of consciousness and headaches.  Psychiatric/Behavioral:  Negative for suicidal ideas.   All other systems reviewed and are negative.    ____________________________________________   PHYSICAL EXAM:  VITAL SIGNS: ED Triage Vitals  Enc Vitals Group     BP      Pulse      Resp      Temp      Temp src      SpO2      Weight      Height      Head Circumference      Peak Flow      Pain Score      Pain Loc      Pain Edu?      Excl. in GC?    Vitals:   12/04/20 1922 12/04/20 2024  BP: (!) 145/69 138/78  Pulse: 76 76  Resp: 14 20  Temp:    SpO2: 97% 95%   Physical Exam Vitals and nursing note reviewed.  Constitutional:      Appearance: He is well-developed. He is obese.  HENT:     Head: Normocephalic and atraumatic.     Right Ear: External ear normal.     Left Ear: External ear normal.     Nose: Nose normal.  Eyes:     Conjunctiva/sclera: Conjunctivae normal.  Cardiovascular:     Rate and Rhythm: Normal rate and regular rhythm.     Heart sounds: No murmur heard. Pulmonary:     Effort: Pulmonary effort is normal. No respiratory distress.     Breath sounds: Normal breath sounds.  Abdominal:     Palpations: Abdomen is soft.     Tenderness: There is no abdominal tenderness.  Musculoskeletal:     Cervical back: Neck supple.  Skin:    General: Skin is warm and dry.  Neurological:     Mental Status: He is alert and oriented to person, place, and time.     Cranial Nerves: Facial asymmetry (L sided facial droop) present.     Motor: Weakness (L arm) present.  Psychiatric:        Mood and Affect: Mood normal.    Some mild soreness of the right knee on the anterior aspect.  There is no large effusion or deformity.  2+ radial and DP pulses.  Sensation is intact to light touch lower extremities.  Patient seems to have symmetric strength in his lower  extremities on my assessment although slightly weaker in the left upper extremity.  With exception of left-sided facial droop cranial nerves II through XII appear grossly intact. ____________________________________________   LABS (all labs ordered are listed, but only  abnormal results are displayed)  Labs Reviewed  CBC - Abnormal; Notable for the following components:      Result Value   HCT 38.6 (*)    Platelets 97 (*)    All other components within normal limits  COMPREHENSIVE METABOLIC PANEL - Abnormal; Notable for the following components:   Glucose, Bld 100 (*)    Calcium 8.6 (*)    All other components within normal limits  RESP PANEL BY RT-PCR (FLU A&B, COVID) ARPGX2  PROTIME-INR  APTT  DIFFERENTIAL  LEVETIRACETAM LEVEL  CBG MONITORING, ED  I-STAT CREATININE, ED   ____________________________________________  EKG  Sinus rhythm with a ventricular rate of 76, normal axis, unremarkable intervals with some artifact in aVL without other clear evidence of acute ischemia or significant arrhythmia. ____________________________________________  RADIOLOGY  ED MD interpretation: CT head without evidence of acute hemorrhage, large ischemia, mass-effect, edema or other acute intracranial process.   CTA head and neck shows no large vessel occlusion, significant stenosis, dissection or other acute abnormality.  There is some ossification of the ligaments along the cervical canal and some mild spinal canal stenosis.  No other acute process noted.  Plain, the right knee shows no acute fracture or dislocation.  There are some degenerative changes noted.  Official radiology report(s): DG Knee Complete 4 Views Right  Result Date: 12/04/2020 CLINICAL DATA:  Right knee pain EXAM: RIGHT KNEE - COMPLETE 4+ VIEW COMPARISON:  04/17/2020 FINDINGS: Degenerative changes throughout the right knee with joint space narrowing and spurring, most pronounced in the medial and patellofemoral  compartments. No joint effusion. No acute bony abnormality. Specifically, no fracture, subluxation, or dislocation. IMPRESSION: Moderate degenerative changes.  No acute bony abnormality. Electronically Signed   By: Charlett Nose M.D.   On: 12/04/2020 19:42   CT HEAD CODE STROKE WO CONTRAST  Result Date: 12/04/2020 CLINICAL DATA:  Code stroke.  Acute neurologic deficit EXAM: CT HEAD WITHOUT CONTRAST TECHNIQUE: Contiguous axial images were obtained from the base of the skull through the vertex without intravenous contrast. COMPARISON:  None. FINDINGS: Brain: There is no mass, hemorrhage or extra-axial collection. The size and configuration of the ventricles and extra-axial CSF spaces are normal. The brain parenchyma is normal, without evidence of acute or chronic infarction. Vascular: No abnormal hyperdensity of the major intracranial arteries or dural venous sinuses. No intracranial atherosclerosis. Skull: The visualized skull base, calvarium and extracranial soft tissues are normal. Sinuses/Orbits: No fluid levels or advanced mucosal thickening of the visualized paranasal sinuses. No mastoid or middle ear effusion. The orbits are normal. ASPECTS [facility] Stroke Program Early CT Score) - Ganglionic level infarction (caudate, lentiform nuclei, internal capsule, insula, M1-M3 cortex): 7 - Supraganglionic infarction (M4-M6 cortex): 3 Total score (0-10 with 10 being normal): 10 IMPRESSION: 1. Normal head CT. 2. ASPECTS is 10. These results were called by telephone at the time of interpretation on 12/04/2020 at 6:46 pm to provider [facility] , who verbally acknowledged these results. Electronically Signed   By: Deatra Robinson M.D.   On: 12/04/2020 18:46   CT ANGIO HEAD NECK W WO CM (CODE STROKE)  Result Date: 12/04/2020 CLINICAL DATA:  Left-sided weakness EXAM: CT ANGIOGRAPHY HEAD AND NECK TECHNIQUE: Multidetector CT imaging of the head and neck was performed using the standard protocol during bolus  administration of intravenous contrast. Multiplanar CT image reconstructions and MIPs were obtained to evaluate the vascular anatomy. Carotid stenosis measurements (when applicable) are obtained utilizing NASCET criteria, using the distal internal carotid diameter as the  denominator. CONTRAST:  67mL OMNIPAQUE IOHEXOL 350 MG/ML SOLN COMPARISON:  None. 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 OTHER NECK: Normal pharynx, larynx and major salivary glands. No cervical lymphadenopathy. Unremarkable thyroid gland. UPPER CHEST: No pneumothorax or pleural effusion. No nodules or masses. AORTIC ARCH: There is no calcific atherosclerosis of the aortic arch. There is no aneurysm, dissection or hemodynamically significant stenosis of the visualized portion of the aorta. Conventional 3 vessel aortic branching pattern. The visualized proximal subclavian arteries are widely patent. RIGHT CAROTID SYSTEM: Normal without aneurysm, dissection or stenosis. LEFT CAROTID SYSTEM: Normal without aneurysm, dissection or stenosis. VERTEBRAL ARTERIES: Left dominant configuration. Both origins are clearly patent. There is no dissection, occlusion or flow-limiting stenosis to the skull base (V1-V3 segments). CTA HEAD FINDINGS POSTERIOR CIRCULATION: --Vertebral arteries: Normal V4 segments. --Inferior cerebellar arteries: Normal. --Basilar artery: Normal. --Superior cerebellar arteries: Normal. --Posterior cerebral arteries (PCA): Normal. ANTERIOR CIRCULATION: --Intracranial internal carotid arteries: Normal. --Anterior cerebral arteries (ACA): Normal. Both A1 segments are present. Patent anterior communicating artery (a-comm). --Middle cerebral arteries (MCA): Normal. VENOUS SINUSES: As permitted by contrast timing, patent. ANATOMIC VARIANTS: None Review of the MIP images confirms the above findings. IMPRESSION: 1. No emergent  large vessel occlusion or high-grade stenosis of the intracranial or cervical arteries. 2. 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 Electronically Signed   By: Deatra Robinson M.D.   On: 12/04/2020 20:11    ____________________________________________   PROCEDURES  Procedure(s) performed (including Critical Care):  .1-3 Lead EKG Interpretation Performed by: Gilles Chiquito, MD Authorized by: Gilles Chiquito, MD     Interpretation: non-specific     ECG rate assessment: normal     Rhythm: sinus rhythm     Ectopy: none     Conduction: normal     ____________________________________________   INITIAL IMPRESSION / ASSESSMENT AND PLAN / ED COURSE      Patient presents with above-stated history exam for assessment of concern weakness after patient states he had a seizure.  Since he has had increasing seizures over the past couple days.  On arrival he is hypertensive otherwise stable vital signs on room air.  He does have some subtle left arm weakness and left-sided facial droop but no other obvious gross supine deficits.  Differential includes postictal state with some residual hemibody weakness i.e. Todd's paralysis, CVA, atypical migraine, and metabolic derangements.  However low suspicion for toxic ingestion.  CT head without evidence of acute hemorrhage, large ischemia, mass-effect, edema or other acute intracranial process.   CTA head and neck shows no large vessel occlusion, significant stenosis, dissection or other acute abnormality.  There is some ossification of the ligaments along the cervical canal and some mild spinal canal stenosis.  No other acute process noted.  Plain, the right knee shows no acute fracture or dislocation.  There are some degenerative changes noted.  Impression is contusion to the right knee.  He is denying any other acute pain.  CMP shows no significant electrolyte or  metabolic derangements.  INR is within normal limits.  CBC without leukocytosis or acute anemia.  There are some chronic appearing normal cytopenia with platelets today at 97 compared to 99 7 months ago.  Per neurology did see patient at bedside Dr Otelia Limes he recommends admission for MRI and EEG in the morning as well as obtaining antiepileptic levels.  I will plan to  admit to medicine service for further evaluation and management.     ____________________________________________   FINAL CLINICAL IMPRESSION(S) / ED DIAGNOSES  Final diagnoses:  Seizure (HCC)  Weakness  Contusion of right knee, initial encounter    Medications  acetaminophen (TYLENOL) tablet 1,000 mg (0 mg Oral Hold 12/04/20 1930)  sodium chloride flush (NS) 0.9 % injection 3 mL (3 mLs Intravenous Given 12/04/20 1931)  iohexol (OMNIPAQUE) 350 MG/ML injection 75 mL (75 mLs Intravenous Contrast Given 12/04/20 1943)  aspirin EC tablet 650 mg (650 mg Oral Given 12/04/20 2035)  ketorolac (TORADOL) 30 MG/ML injection 15 mg (15 mg Intravenous Given 12/04/20 2037)     ED Discharge Orders     None        Note:  This document was prepared using Dragon voice recognition software and may include unintentional dictation errors.    Gilles Chiquito, MD 12/04/20 2039

## 2020-12-04 NOTE — ED Triage Notes (Signed)
Pt via EMS from home. EMS was called out for L sided facial droop, L sided facial/arm numbness, slurred speech, headache, and L side upper extremity drift. Pt states that he had a seizure today at 2:00pm. Woke up and felt like this. Pt states that he has had increased seizures past month, denies missing any medications.   LKW per EMS 1500. Pt is A&Ox4 and NAD.

## 2020-12-04 NOTE — ED Notes (Signed)
Pt up talking with staff. Pt able to take all PO meds. NADN

## 2020-12-04 NOTE — ED Notes (Signed)
Second Swallow screen failed. MD aware

## 2020-12-04 NOTE — ED Notes (Addendum)
Pt failed swallow screen. MD aware.

## 2020-12-04 NOTE — ED Notes (Signed)
Neurologist at bedside. 

## 2020-12-04 NOTE — Consult Note (Signed)
PHARMACIST - PHYSICIAN COMMUNICATION  CONCERNING:  Enoxaparin (Lovenox) for DVT Prophylaxis    RECOMMENDATION: Patient was prescribed enoxaparin 40mg  q24 hours for VTE prophylaxis.   Filed Weights   12/04/20 1856  Weight: (!) 195 kg (430 lb)    Body mass index is 58.32 kg/m.  Estimated Creatinine Clearance: 196.9 mL/min (by C-G formula based on SCr of 0.75 mg/dL).   Based on [facility] policy patient is candidate for enoxaparin 0.5mg /kg TBW SQ every 24 hours based on BMI being >30.  DESCRIPTION: Pharmacy has adjusted enoxaparin dose per [facility] policy.  Patient is now receiving enoxaparin 97.5 mg every 24 hours   [facility] 12/04/2020 9:19 PM

## 2020-12-05 ENCOUNTER — Inpatient Hospital Stay (HOSPITAL_COMMUNITY)
Admit: 2020-12-05 | Discharge: 2020-12-05 | Disposition: A | Discharge status: Home / Self Care | Payer: Medicaid Other | Attending: Internal Medicine | Admitting: Internal Medicine

## 2020-12-05 ENCOUNTER — Encounter: Payer: Self-pay | Admitting: Internal Medicine

## 2020-12-05 DIAGNOSIS — G40919 Epilepsy, unspecified, intractable, without status epilepticus: Secondary | ICD-10-CM | POA: Diagnosis not present

## 2020-12-05 DIAGNOSIS — G4733 Obstructive sleep apnea (adult) (pediatric): Secondary | ICD-10-CM

## 2020-12-05 DIAGNOSIS — I6389 Other cerebral infarction: Secondary | ICD-10-CM | POA: Diagnosis not present

## 2020-12-05 DIAGNOSIS — G40019 Localization-related (focal) (partial) idiopathic epilepsy and epileptic syndromes with seizures of localized onset, intractable, without status epilepticus: Secondary | ICD-10-CM | POA: Diagnosis not present

## 2020-12-05 DIAGNOSIS — Z9989 Dependence on other enabling machines and devices: Secondary | ICD-10-CM

## 2020-12-05 DIAGNOSIS — G510 Bell's palsy: Secondary | ICD-10-CM | POA: Diagnosis not present

## 2020-12-05 DIAGNOSIS — G8194 Hemiplegia, unspecified affecting left nondominant side: Secondary | ICD-10-CM | POA: Diagnosis not present

## 2020-12-05 LAB — LIPID PANEL
Cholesterol: 196 mg/dL (ref 0–200)
HDL: 38 mg/dL — ABNORMAL LOW (ref >40)
LDL Cholesterol: 123 mg/dL — ABNORMAL HIGH (ref 0–99)
Total CHOL/HDL Ratio: 5.2 RATIO
Triglycerides: 175 mg/dL — ABNORMAL HIGH (ref <150)
VLDL: 35 mg/dL (ref 0–40)

## 2020-12-05 LAB — HEMOGLOBIN A1C
Hgb A1c MFr Bld: 5.4 % (ref 4.8–5.6)
Mean Plasma Glucose: 108.28 mg/dL

## 2020-12-05 LAB — ECHOCARDIOGRAM COMPLETE
Height: 72 in
S' Lateral: 3.9 cm
Weight: 6880 oz

## 2020-12-05 MED ORDER — PREDNISONE 20 MG PO TABS: 20.0000 mg | ORAL_TABLET | Freq: Every day | ORAL | Status: DC

## 2020-12-05 MED ORDER — VALACYCLOVIR HCL 500 MG PO TABS
1000.0000 mg | ORAL_TABLET | Freq: Three times a day (TID) | ORAL | Status: DC
  Administered 2020-12-05 – 2020-12-06 (×2): 1000 mg via ORAL
  Filled 2020-12-05 (×2): qty 2

## 2020-12-05 MED ORDER — PREDNISONE 20 MG PO TABS: 40.0000 mg | ORAL_TABLET | Freq: Every day | ORAL | Status: DC

## 2020-12-05 MED ORDER — PREDNISONE 20 MG PO TABS: 30.0000 mg | ORAL_TABLET | Freq: Every day | ORAL | Status: DC

## 2020-12-05 MED ORDER — PREDNISONE 20 MG PO TABS: 50.0000 mg | ORAL_TABLET | Freq: Every day | ORAL | Status: DC

## 2020-12-05 MED ORDER — PREDNISONE 20 MG PO TABS
60.0000 mg | ORAL_TABLET | Freq: Every day | ORAL | Status: DC
  Administered 2020-12-05 – 2020-12-06 (×2): 60 mg via ORAL
  Filled 2020-12-05 (×2): qty 3

## 2020-12-05 MED ORDER — PREDNISONE 10 MG PO TABS: 10.0000 mg | ORAL_TABLET | Freq: Every day | ORAL | Status: DC

## 2020-12-05 NOTE — Progress Notes (Signed)
Progress Note    Randy Cordova.  MWN:[id number] DOB: 1970/05/04  DOA: 12/04/2020 PCP: [place]      Brief Narrative:    Medical records reviewed and are as summarized below:  Randy Carattini. is a 50 y.o. male with medical history significant for morbid obesity, degenerative joint disease, OSA on CPAP, epilepsy refractory to treatment on multiple antiepileptics, who presented to the hospital after breakthrough seizures and left-sided weakness.  He said he about 5 seizures since Friday, 12/01/2020.  He said his roommate caught one of the seizures on camera.  He had one breakthrough seizure a day prior to admission.  He slept after the seizure.  When he woke up he noticed left-sided weakness.  Code stroke was called in the emergency room for concern for tPA. tPA was not administered because patient declined.  CT head and MRI brain without contrast did not show any evidence of stroke.  He has left facial palsy suspicious for Bell's palsy.  MRI brain with contrast has been ordered for further evaluation.  Assessment/Plan:   Active Problems:   Breakthrough seizures, recurrent (HCC)   OSA on CPAP   Epilepsy ([place])   Acute left hemiparesis (HCC)   Morbid obesity with BMI of 50.0-59.9, adult (HCC)   Body mass index is 58.32 kg/m.  Epilepsy with breakthrough seizures: Continue antiepileptics.  He is on Keppra, oxcarbazepine and Onfi.  Patient said he had been weaned off of Topamax.  No evidence of acute stroke on MRI brain without contrast.  Left facial palsy,? Bell's palsy: He has been started on prednisone and valacyclovir.  MRI brain with contrast has been ordered for further evaluation.  OSA: Continue CPAP at night   Diet Order             Diet Heart Room service appropriate? Yes; Fluid consistency: Thin  Diet effective now                      Consultants: Neurologist  Procedures: None    Medications:    acetaminophen  1,000 mg Oral  Once   chlorhexidine gluconate (MEDLINE KIT)  15 mL Mouth Rinse BID   cloBAZam  20 mg Oral QHS   enoxaparin (LOVENOX) injection  0.5 mg/kg Subcutaneous Q24H   levETIRAcetam  1,500 mg Oral BID   mouth rinse  15 mL Mouth Rinse 10 times per day   oxcarbazepine  1,200 mg Oral BID   predniSONE  60 mg Oral Q breakfast   Followed by   Derrill Memo ON 12/11/2020] predniSONE  50 mg Oral Q breakfast   Followed by   Derrill Memo ON 12/12/2020] predniSONE  40 mg Oral Q breakfast   Followed by   Derrill Memo ON 12/13/2020] predniSONE  30 mg Oral Q breakfast   Followed by   Derrill Memo ON 12/14/2020] predniSONE  20 mg Oral Q breakfast   Followed by   Derrill Memo ON 12/15/2020] predniSONE  10 mg Oral Q breakfast   valACYclovir  1,000 mg Oral TID   Continuous Infusions:  sodium chloride 75 mL/hr at 12/04/20 2229   sodium chloride 75 mL/hr (12/04/20 2219)     Anti-infectives (From admission, onward)    Start     Dose/Rate Route Frequency Ordered Stop   12/05/20 1630  valACYclovir (VALTREX) tablet 1,000 mg        1,000 mg Oral 3 times daily 12/05/20 1616 12/12/20 1559  Family Communication/Anticipated D/C date and plan/Code Status   DVT prophylaxis:      Code Status: Full Code  Family Communication: None Disposition Plan: Plan to discharge home tomorrow   Status is: Inpatient  Remains inpatient appropriate because: MRI brain w contrast pending           Subjective:   C/o left facial numbness, drooling from the left corner of his mouth, left ear pain and left-sided headache.   Objective:    Vitals:   12/05/20 0540 12/05/20 0730 12/05/20 1208 12/05/20 1543  BP: 118/74 123/69 122/65 135/69  Pulse: 80 77 95 79  Resp: _0 Temp:      TempSrc:      SpO2: 92% 97% 96% 96%  Weight:      Height:       No data found.  No intake or output data in the 24 hours ending 12/05/20 1625 Filed Weights   12/04/20 1856  Weight: (!) 195 kg    Exam:  GEN: NAD SKIN: Warm and  dry EYES: EOMI ENT: MMM CV: RRR PULM: CTA B ABD: soft, obese, NT, +BS CNS: AAO x 3, left facial palsy EXT: No edema or tenderness        Data Reviewed:   I have personally reviewed following labs and imaging studies:  Labs: Labs show the following:   Basic Metabolic Panel: Recent Labs  Lab 12/04/20 1912  NA 137  K 4.1  CL 105  CO2 26  GLUCOSE 100*  BUN 17  CREATININE 0.75  CALCIUM 8.6*   GFR Estimated Creatinine Clearance: 196.9 mL/min (by C-G formula based on SCr of 0.75 mg/dL). Liver Function Tests: Recent Labs  Lab 12/04/20 1912  AST 25  ALT 26  ALKPHOS 83  BILITOT 0.7  PROT 7.0  ALBUMIN 3.7   No results for input(s): LIPASE, AMYLASE in the last 168 hours. No results for input(s): AMMONIA in the last 168 hours. Coagulation profile Recent Labs  Lab 12/04/20 1912  INR 1.1    CBC: Recent Labs  Lab 12/04/20 1912  WBC 4.9  NEUTROABS 2.5  HGB 13.5  HCT 38.6*  MCV 89.1  PLT 97*   Cardiac Enzymes: No results for input(s): CKTOTAL, CKMB, CKMBINDEX, TROPONINI in the last 168 hours. BNP (last 3 results) No results for input(s): PROBNP in the last 8760 hours. CBG: Recent Labs  Lab 12/04/20 1829  GLUCAP 97   D-Dimer: No results for input(s): DDIMER in the last 72 hours. Hgb A1c: Recent Labs    12/05/20 0723  HGBA1C 5.4   Lipid Profile: Recent Labs    12/05/20 0723  CHOL 196  HDL 38*  LDLCALC 123*  TRIG 175*  CHOLHDL 5.2   Thyroid function studies: No results for input(s): TSH, T4TOTAL, T3FREE, THYROIDAB in the last 72 hours.  Invalid input(s): FREET3 Anemia work up: No results for input(s): VITAMINB12, FOLATE, FERRITIN, TIBC, IRON, RETICCTPCT in the last 72 hours. Sepsis Labs: Recent Labs  Lab 12/04/20 1912  WBC 4.9    Microbiology Recent Results (from the past 240 hour(s))  Resp Panel by RT-PCR (Flu A&B, Covid) Nasopharyngeal Swab     Status: None   Collection Time: 12/04/20  8:30 PM   Specimen: Nasopharyngeal  Swab; Nasopharyngeal(NP) swabs in vial transport medium  Result Value Ref Range Status   SARS Coronavirus 2 by RT PCR NEGATIVE NEGATIVE Final    Comment: (NOTE) SARS-CoV-2 target nucleic acids are NOT DETECTED.  The SARS-CoV-2 RNA is  generally detectable in upper respiratory specimens during the acute phase of infection. The lowest concentration of SARS-CoV-2 viral copies this assay can detect is 138 copies/mL. A negative result does not preclude SARS-Cov-2 infection and should not be used as the sole basis for treatment or other patient management decisions. A negative result may occur with  improper specimen collection/handling, submission of specimen other than nasopharyngeal swab, presence of viral mutation(s) within the areas targeted by this assay, and inadequate number of viral copies(<138 copies/mL). A negative result must be combined with clinical observations, patient history, and epidemiological information. The expected result is Negative.  Fact Sheet for Patients:  EntrepreneurPulse.com.au  Fact Sheet for Healthcare Providers:  IncredibleEmployment.be  This test is no t yet approved or cleared by the [place] FDA and  has been authorized for detection and/or diagnosis of SARS-CoV-2 by FDA under an Emergency Use Authorization (EUA). This EUA will remain  in effect (meaning this test can be used) for the duration of the COVID-19 declaration under Section 564(b)(1) of the Act, 21 U.S.C.section 360bbb-3(b)(1), unless the authorization is terminated  or revoked sooner.       Influenza A by PCR NEGATIVE NEGATIVE Final   Influenza B by PCR NEGATIVE NEGATIVE Final    Comment: (NOTE) The Xpert Xpress SARS-CoV-2/FLU/RSV plus assay is intended as an aid in the diagnosis of influenza from Nasopharyngeal swab specimens and should not be used as a sole basis for treatment. Nasal washings and aspirates are unacceptable for Xpert Xpress  SARS-CoV-2/FLU/RSV testing.  Fact Sheet for Patients: EntrepreneurPulse.com.au  Fact Sheet for Healthcare Providers: IncredibleEmployment.be  This test is not yet approved or cleared by the [place] FDA and has been authorized for detection and/or diagnosis of SARS-CoV-2 by FDA under an Emergency Use Authorization (EUA). This EUA will remain in effect (meaning this test can be used) for the duration of the COVID-19 declaration under Section 564(b)(1) of the Act, 21 U.S.C. section 360bbb-3(b)(1), unless the authorization is terminated or revoked.  Performed at [facility], [address] [place], [place] [address]     Procedures and diagnostic studies:  MR BRAIN WO CONTRAST  Result Date: 12/04/2020 CLINICAL DATA:  Left facial droop and left facial and arm numbness. EXAM: MRI HEAD WITHOUT CONTRAST TECHNIQUE: Multiplanar, multiecho pulse sequences of the brain and surrounding structures were obtained without intravenous contrast. COMPARISON:  None. FINDINGS: Brain: No acute infarct, mass effect or extra-axial collection. No acute or chronic hemorrhage. There is multifocal hyperintense T2-weighted signal within the white matter. Parenchymal volume and CSF spaces are normal. The midline structures are normal. Vascular: Major flow voids are preserved. Skull and upper cervical spine: Normal calvarium and skull base. Visualized upper cervical spine and soft tissues are normal. Sinuses/Orbits:No paranasal sinus fluid levels or advanced mucosal thickening. No mastoid or middle ear effusion. Normal orbits. IMPRESSION: 1. No acute intracranial abnormality. 2. Multifocal hyperintense T2-weighted signal within the white matter, most commonly seen in the setting of chronic microvascular ischemia. Electronically Signed   By: Ulyses Jarred M.D.   On: 12/04/2020 21:37   DG Knee Complete 4 Views Right  Result Date: 12/04/2020 CLINICAL DATA:  Right knee  pain EXAM: RIGHT KNEE - COMPLETE 4+ VIEW COMPARISON:  04/17/2020 FINDINGS: Degenerative changes throughout the right knee with joint space narrowing and spurring, most pronounced in the medial and patellofemoral compartments. No joint effusion. No acute bony abnormality. Specifically, no fracture, subluxation, or dislocation. IMPRESSION: Moderate degenerative changes.  No acute bony abnormality. Electronically Signed  By: Rolm Baptise M.D.   On: 12/04/2020 19:42   EEG adult  Result Date: 12/05/2020 Lora Havens, MD     12/05/2020  3:50 PM Patient Name: Randy Anthis. MRN: [id number] Epilepsy Attending: Lora Havens Referring Physician/Provider: Dr Kerney Elbe Date: 12/05/2020 Duration: 28.24 mins Patient history: 50 year old male with history of epilepsy presented with acute onset left-sided weakness following a seizure at home.  EEG to evaluate for seizures. Level of alertness: Awake, asleep AEDs during EEG study: OXC, TPM, LEV, Onfi Technical aspects: This EEG study was done with scalp electrodes positioned according to the 10-20 International system of electrode placement. Electrical activity was acquired at a sampling rate of _0  and reviewed with a high frequency filter of _1  and a low frequency filter of _2 . EEG data were recorded continuously and digitally stored. Description: The posterior dominant rhythm consists of 9 Hz activity of moderate voltage (25-35 uV) seen predominantly in posterior head regions, symmetric and reactive to eye opening and eye closing. Sleep was characterized by vertex waves, sleep spindles (12 to 14 Hz), maximal frontocentral region. There is an excessive amount of 15 to 18 Hz beta activity  distributed symmetrically and diffusely. Physiologic photic driving was not seen during photic stimulation. Hyperventilation was not performed.    ABNORMALITY - Excessive beta, generalized  IMPRESSION: This study is within normal limits. No seizures or epileptiform  discharges were seen throughout the recording. The excessive beta activity seen in the background is most likely due to the effect of benzodiazepine and is a benign EEG pattern.   Lora Havens   ECHOCARDIOGRAM COMPLETE  Result Date: 12/05/2020    ECHOCARDIOGRAM REPORT   Patient Name:   Randy Rallo. Date of Exam: 12/05/2020 Medical Rec #:  [id number]          Height:       72.0 in Accession #:    [id number]         Weight:       430.0 lb Date of Birth:  18-Aug-1970           BSA:          2.950 m Patient Age:    50 years           BP:           123/69 mmHg Patient Gender: M                  HR:           77 bpm. Exam Location:  [facility] Procedure: 2D Echo, Cardiac Doppler and Color Doppler Indications:     Stroke I63.9  History:         Patient has no prior history of Echocardiogram examinations.                  OSA.  Sonographer:     Sherrie Sport Referring Phys:  [id number] Athena Masse Diagnosing Phys: Nelva Bush MD  Sonographer Comments: No apical window and no subcostal window. IMPRESSIONS  1. Left ventricular ejection fraction, by estimation, is 45 to 50%. The left ventricle has mildly decreased function. Left ventricular endocardial border not optimally defined to evaluate regional wall motion. There is mild left ventricular hypertrophy.  Left ventricular diastolic function could not be evaluated.  2. Right ventricular systolic function was not well visualized. The right ventricular size is not well visualized. Tricuspid regurgitation signal is inadequate for assessing PA pressure.  3. The mitral valve  is abnormal. Unable to accurately assess mitral valve regurgitation.  4. The aortic valve has an indeterminant number of cusps. Aortic valve regurgitation not well assessed. FINDINGS  Left Ventricle: Left ventricular ejection fraction, by estimation, is 45 to 50%. The left ventricle has mildly decreased function. Left ventricular endocardial border not optimally defined to evaluate regional wall motion.  The left ventricular internal cavity size was normal in size. There is mild left ventricular hypertrophy. Left ventricular diastolic function could not be evaluated. Right Ventricle: The right ventricular size is not well visualized. No increase in right ventricular wall thickness. Right ventricular systolic function was not well visualized. Tricuspid regurgitation signal is inadequate for assessing PA pressure. Left Atrium: Left atrial size was not well visualized. Right Atrium: Right atrial size was not well visualized. Pericardium: The pericardium was not well visualized. Mitral Valve: The mitral valve is abnormal. There is moderate thickening of the mitral valve leaflet(s). Unable to accurately assess mitral valve regurgitation. Tricuspid Valve: The tricuspid valve is not well visualized. Tricuspid valve regurgitation is not demonstrated. Aortic Valve: The aortic valve has an indeterminant number of cusps. Aortic valve regurgitation not well assessed. Pulmonic Valve: The pulmonic valve was not well visualized. Pulmonic valve regurgitation is not visualized. No evidence of pulmonic stenosis. Aorta: The aortic root is normal in size and structure. Pulmonary Artery: The pulmonary artery is not well seen. IAS/Shunts: The interatrial septum was not well visualized.  LEFT VENTRICLE PLAX 2D LVIDd:         5.12 cm LVIDs:         3.90 cm LV PW:         1.22 cm LV IVS:        1.27 cm LVOT diam:     2.30 cm LVOT Area:     4.15 cm  LEFT ATRIUM         Index LA diam:    4.10 cm 1.39 cm/m                        PULMONIC VALVE AORTA                 PV Vmax:        0.75 m/s Ao Root diam: 3.43 cm PV Vmean:       49.250 cm/s                       PV VTI:         0.149 m                       PV Peak grad:   2.3 mmHg                       PV Mean grad:   1.0 mmHg                       RVOT Peak grad: 4 mmHg   SHUNTS Systemic Diam: 2.30 cm Pulmonic VTI:  0.180 m Nelva Bush MD Electronically signed by Nelva Bush MD  Signature Date/Time: 12/05/2020/3:38:32 PM    Final    CT HEAD CODE STROKE WO CONTRAST  Result Date: 12/04/2020 CLINICAL DATA:  Code stroke.  Acute neurologic deficit EXAM: CT HEAD WITHOUT CONTRAST TECHNIQUE: Contiguous axial images were obtained from the base of the skull through the vertex without intravenous contrast. COMPARISON:  None. FINDINGS: Brain: There  is no mass, hemorrhage or extra-axial collection. The size and configuration of the ventricles and extra-axial CSF spaces are normal. The brain parenchyma is normal, without evidence of acute or chronic infarction. Vascular: No abnormal hyperdensity of the major intracranial arteries or dural venous sinuses. No intracranial atherosclerosis. Skull: The visualized skull base, calvarium and extracranial soft tissues are normal. Sinuses/Orbits: No fluid levels or advanced mucosal thickening of the visualized paranasal sinuses. No mastoid or middle ear effusion. The orbits are normal. ASPECTS [facility] Stroke Program Early CT Score) - Ganglionic level infarction (caudate, lentiform nuclei, internal capsule, insula, M1-M3 cortex): 7 - Supraganglionic infarction (M4-M6 cortex): 3 Total score (0-10 with 10 being normal): 10 IMPRESSION: 1. Normal head CT. 2. ASPECTS is 10. These results were called by telephone at the time of interpretation on 12/04/2020 at 6:46 pm to provider [facility] , who verbally acknowledged these results. Electronically Signed   By: Ulyses Jarred M.D.   On: 12/04/2020 18:46   CT ANGIO HEAD NECK W WO CM (CODE STROKE)  Result Date: 12/04/2020 CLINICAL DATA:  Left-sided weakness EXAM: CT ANGIOGRAPHY HEAD AND NECK TECHNIQUE: Multidetector CT imaging of the head and neck was performed using the standard protocol during bolus administration of intravenous contrast. Multiplanar CT image reconstructions and MIPs were obtained to evaluate the vascular anatomy. Carotid stenosis measurements (when applicable) are obtained utilizing NASCET  criteria, using the distal internal carotid diameter as the denominator. CONTRAST:  64m OMNIPAQUE IOHEXOL 350 MG/ML SOLN COMPARISON:  None. 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 OTHER NECK: Normal pharynx, larynx and major salivary glands. No cervical lymphadenopathy. Unremarkable thyroid gland. UPPER CHEST: No pneumothorax or pleural effusion. No nodules or masses. AORTIC ARCH: There is no calcific atherosclerosis of the aortic arch. There is no aneurysm, dissection or hemodynamically significant stenosis of the visualized portion of the aorta. Conventional 3 vessel aortic branching pattern. The visualized proximal subclavian arteries are widely patent. RIGHT CAROTID SYSTEM: Normal without aneurysm, dissection or stenosis. LEFT CAROTID SYSTEM: Normal without aneurysm, dissection or stenosis. VERTEBRAL ARTERIES: Left dominant configuration. Both origins are clearly patent. There is no dissection, occlusion or flow-limiting stenosis to the skull base (V1-V3 segments). CTA HEAD FINDINGS POSTERIOR CIRCULATION: --Vertebral arteries: Normal V4 segments. --Inferior cerebellar arteries: Normal. --Basilar artery: Normal. --Superior cerebellar arteries: Normal. --Posterior cerebral arteries (PCA): Normal. ANTERIOR CIRCULATION: --Intracranial internal carotid arteries: Normal. --Anterior cerebral arteries (ACA): Normal. Both A1 segments are present. Patent anterior communicating artery (a-comm). --Middle cerebral arteries (MCA): Normal. VENOUS SINUSES: As permitted by contrast timing, patent. ANATOMIC VARIANTS: None Review of the MIP images confirms the above findings. IMPRESSION: 1. No emergent large vessel occlusion or high-grade stenosis of the intracranial or cervical arteries. 2. 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  Electronically Signed   By: KUlyses JarredM.D.   On: 12/04/2020 20:11               LOS: 1 day   Willa Brocks  [facility]   Pager on [email or web address] If 7PM-7AM, please contact night-coverage at [email or web address]     12/05/2020, 4:25 PM

## 2020-12-05 NOTE — Procedures (Signed)
Patient Name: Calistro Rauf.  MRN: [id number]  Epilepsy Attending: Charlsie Quest  Referring Physician/Provider: Dr Caryl Pina Date: 12/05/2020 Duration: 28.24 mins  Patient history: 50 year old male with history of epilepsy presented with acute onset left-sided weakness following a seizure at home.  EEG to evaluate for seizures.  Level of alertness: Awake, asleep  AEDs during EEG study: OXC, TPM, LEV, Onfi  Technical aspects: This EEG study was done with scalp electrodes positioned according to the 10-20 International system of electrode placement. Electrical activity was acquired at a sampling rate of 500Hz  and reviewed with a high frequency filter of 70Hz  and a low frequency filter of 1Hz . EEG data were recorded continuously and digitally stored.   Description: The posterior dominant rhythm consists of 9 Hz activity of moderate voltage (25-35 uV) seen predominantly in posterior head regions, symmetric and reactive to eye opening and eye closing. Sleep was characterized by vertex waves, sleep spindles (12 to 14 Hz), maximal frontocentral region. There is an excessive amount of 15 to 18 Hz beta activity  distributed symmetrically and diffusely. Physiologic photic driving was not seen during photic stimulation. Hyperventilation was not performed.      ABNORMALITY - Excessive beta, generalized   IMPRESSION: This study is within normal limits. No seizures or epileptiform discharges were seen throughout the recording. The excessive beta activity seen in the background is most likely due to the effect of benzodiazepine and is a benign EEG pattern.     Jamarie Joplin 

## 2020-12-05 NOTE — ED Notes (Signed)
Seen pt in room at this time, reports lt side of face < sensation to rt side face, facial assymmetry  noted

## 2020-12-05 NOTE — Progress Notes (Signed)
Eeg done 

## 2020-12-05 NOTE — Progress Notes (Signed)
*  PRELIMINARY RESULTS* Echocardiogram 2D Echocardiogram has been performed.  Cristela Blue 12/05/2020, 8:53 AM

## 2020-12-05 NOTE — Progress Notes (Addendum)
Subjective: The patient feels that his left side is stronger today and almost at baseline. No seizures overnight.  Complains of left ear pain. Left facial weakness has gotten worse overnight. Also states that he his sense of taste has not been normal since shortly before his seizure yesterday.   Objective: Current vital signs: BP 123/69   Pulse 77   Temp 98.2 F (36.8 C) (Oral)   Resp 12   Ht 6' (1.829 m)   Wt (!) 195 kg   SpO2 97%   BMI 58.32 kg/m  Vital signs in last 24 hours: Temp:  [98.2 F (36.8 C)] 98.2 F (36.8 C) (10/31 1855) Pulse Rate:  [75-80] 77 (11/01 0730) Resp:  [12-20] 12 (11/01 0730) BP: (117-154)/(67-78) 123/69 (11/01 0730) SpO2:  [90 %-100 %] 97 % (11/01 0730) Weight:  [195 kg] 195 kg (10/31 1856)  Intake/Output from previous day: No intake/output data recorded. Intake/Output this shift: No intake/output data recorded. Nutritional status:  Diet Order             Diet Heart Room service appropriate? Yes; Fluid consistency: Thin  Diet effective now                  HEENT: [place]/AT. No vesicles seen on otoscopic exam of left ear.  Lungs: Respirations unlabored Ext: Warm and well perfused  Neurologic Exam: Ment: Intact to complex questions and commands. No aphasia. Fully oriented. Speech non-dysarthric.  CN: PERRL, Visual fields intact. EOMI. Temp sensation equal bilaterally. Decreased contractions of muscles to LLQ of face as well as brow, in addition to weakness of left eyelid closure; the latter two findings are new relative to yesterday's exam. Phonation intact. Tongue protrudes midline today.  Motor: 5/5 BUE and BLE.  Sensory: Intact to FT x 4 Cerebellar: No ataxia with FNF bilaterally  Lab Results: Results for orders placed or performed during the hospital encounter of 12/04/20 (from the past 48 hour(s))  CBG monitoring, ED     Status: None   Collection Time: 12/04/20  6:29 PM  Result Value Ref Range   Glucose-Capillary 97 70 - 99 mg/dL     Comment: Glucose reference range applies only to samples taken after fasting for at least 8 hours.  Protime-INR     Status: None   Collection Time: 12/04/20  7:12 PM  Result Value Ref Range   Prothrombin Time 13.8 11.4 - 15.2 seconds   INR 1.1 0.8 - 1.2    Comment: (NOTE) INR goal varies based on device and disease states. Performed at [facility], [address] [place], [place] [address]   APTT     Status: None   Collection Time: 12/04/20  7:12 PM  Result Value Ref Range   aPTT 26 24 - 36 seconds    Comment: Performed at [facility], [address] [place], [place] [address]  CBC     Status: Abnormal   Collection Time: 12/04/20  7:12 PM  Result Value Ref Range   WBC 4.9 4.0 - 10.5 K/uL   RBC 4.33 4.22 - 5.81 MIL/uL   Hemoglobin 13.5 13.0 - 17.0 g/dL   HCT 38.6 (L) 39.0 - 52.0 %   MCV 89.1 80.0 - 100.0 fL   MCH 31.2 26.0 - 34.0 pg   MCHC 35.0 30.0 - 36.0 g/dL   RDW 13.3 11.5 - 15.5 %   Platelets 97 (L) 150 - 400 K/uL    Comment: Immature Platelet Fraction may be clinically indicated, consider  ordering this additional test [id number]    nRBC 0.0 0.0 - 0.2 %    Comment: Performed at [facility], [address] [place], [place] [address]  Differential     Status: None   Collection Time: 12/04/20  7:12 PM  Result Value Ref Range   Neutrophils Relative % 51 %   Neutro Abs 2.5 1.7 - 7.7 K/uL   Lymphocytes Relative 41 %   Lymphs Abs 2.0 0.7 - 4.0 K/uL   Monocytes Relative 6 %   Monocytes Absolute 0.3 0.1 - 1.0 K/uL   Eosinophils Relative 1 %   Eosinophils Absolute 0.1 0.0 - 0.5 K/uL   Basophils Relative 1 %   Basophils Absolute 0.0 0.0 - 0.1 K/uL   Immature Granulocytes 0 %   Abs Immature Granulocytes 0.02 0.00 - 0.07 K/uL    Comment: Performed at [facility], [address] [place], [place] [address]  Comprehensive metabolic panel     Status: Abnormal   Collection Time: 12/04/20  7:12 PM  Result Value Ref Range   Sodium  137 135 - 145 mmol/L   Potassium 4.1 3.5 - 5.1 mmol/L   Chloride 105 98 - 111 mmol/L   CO2 26 22 - 32 mmol/L   Glucose, Bld 100 (H) 70 - 99 mg/dL    Comment: Glucose reference range applies only to samples taken after fasting for at least 8 hours.   BUN 17 6 - 20 mg/dL   Creatinine, Ser 0.75 0.61 - 1.24 mg/dL   Calcium 8.6 (L) 8.9 - 10.3 mg/dL   Total Protein 7.0 6.5 - 8.1 g/dL   Albumin 3.7 3.5 - 5.0 g/dL   AST 25 15 - 41 U/L   ALT 26 0 - 44 U/L   Alkaline Phosphatase 83 38 - 126 U/L   Total Bilirubin 0.7 0.3 - 1.2 mg/dL   GFR, Estimated >60 >60 mL/min    Comment: (NOTE) Calculated using the CKD-EPI Creatinine Equation (2021)    Anion gap 6 5 - 15    Comment: Performed at [facility], [address], [place], [place] [address]  Resp Panel by RT-PCR (Flu A&B, Covid) Nasopharyngeal Swab     Status: None   Collection Time: 12/04/20  8:30 PM   Specimen: Nasopharyngeal Swab; Nasopharyngeal(NP) swabs in vial transport medium  Result Value Ref Range   SARS Coronavirus 2 by RT PCR NEGATIVE NEGATIVE    Comment: (NOTE) SARS-CoV-2 target nucleic acids are NOT DETECTED.  The SARS-CoV-2 RNA is generally detectable in upper respiratory specimens during the acute phase of infection. The lowest concentration of SARS-CoV-2 viral copies this assay can detect is 138 copies/mL. A negative result does not preclude SARS-Cov-2 infection and should not be used as the sole basis for treatment or other patient management decisions. A negative result may occur with  improper specimen collection/handling, submission of specimen other than nasopharyngeal swab, presence of viral mutation(s) within the areas targeted by this assay, and inadequate number of viral copies(<138 copies/mL). A negative result must be combined with clinical observations, patient history, and epidemiological information. The expected result is Negative.  Fact Sheet for Patients:   EntrepreneurPulse.com.au  Fact Sheet for Healthcare Providers:  IncredibleEmployment.be  This test is no t yet approved or cleared by the [place] FDA and  has been authorized for detection and/or diagnosis of SARS-CoV-2 by FDA under an Emergency Use Authorization (EUA). This EUA will remain  in effect (meaning this test can be used) for  the duration of the COVID-19 declaration under Section 564(b)(1) of the Act, 21 U.S.C.section 360bbb-3(b)(1), unless the authorization is terminated  or revoked sooner.       Influenza A by PCR NEGATIVE NEGATIVE   Influenza B by PCR NEGATIVE NEGATIVE    Comment: (NOTE) The Xpert Xpress SARS-CoV-2/FLU/RSV plus assay is intended as an aid in the diagnosis of influenza from Nasopharyngeal swab specimens and should not be used as a sole basis for treatment. Nasal washings and aspirates are unacceptable for Xpert Xpress SARS-CoV-2/FLU/RSV testing.  Fact Sheet for Patients: EntrepreneurPulse.com.au  Fact Sheet for Healthcare Providers: IncredibleEmployment.be  This test is not yet approved or cleared by the [place] FDA and has been authorized for detection and/or diagnosis of SARS-CoV-2 by FDA under an Emergency Use Authorization (EUA). This EUA will remain in effect (meaning this test can be used) for the duration of the COVID-19 declaration under Section 564(b)(1) of the Act, 21 U.S.C. section 360bbb-3(b)(1), unless the authorization is terminated or revoked.  Performed at [facility], [address] [place], [place] [address]   Hemoglobin A1c     Status: None   Collection Time: 12/05/20  7:23 AM  Result Value Ref Range   Hgb A1c MFr Bld 5.4 4.8 - 5.6 %    Comment: (NOTE) Pre diabetes:          5.7%-6.4%  Diabetes:              >6.4%  Glycemic control for   <7.0% adults with diabetes    Mean Plasma Glucose 108.28 mg/dL    Comment: Performed  at [place] [address], [place], [place] [address]  Lipid panel     Status: Abnormal   Collection Time: 12/05/20  7:23 AM  Result Value Ref Range   Cholesterol 196 0 - 200 mg/dL   Triglycerides 175 (H) <150 mg/dL   HDL 38 (L) >40 mg/dL   Total CHOL/HDL Ratio 5.2 RATIO   VLDL 35 0 - 40 mg/dL   LDL Cholesterol 123 (H) 0 - 99 mg/dL    Comment:        Total Cholesterol/HDL:CHD Risk Coronary Heart Disease Risk Table                     Men   Women  1/2 Average Risk   3.4   3.3  Average Risk       5.0   4.4  2 X Average Risk   9.6   7.1  3 X Average Risk  23.4   11.0        Use the calculated Patient Ratio above and the CHD Risk Table to determine the patient's CHD Risk.        ATP III CLASSIFICATION (LDL):  <100     mg/dL   Optimal  100-129  mg/dL   Near or Above                    Optimal  130-159  mg/dL   Borderline  160-189  mg/dL   High  >190     mg/dL   Very High Performed at [facility], [address] [place], [place] [address]     Recent Results (from the past 240 hour(s))  Resp Panel by RT-PCR (Flu A&B, Covid) Nasopharyngeal Swab     Status: None   Collection Time: 12/04/20  8:30 PM   Specimen: Nasopharyngeal Swab; Nasopharyngeal(NP) swabs in vial transport  medium  Result Value Ref Range Status   SARS Coronavirus 2 by RT PCR NEGATIVE NEGATIVE Final    Comment: (NOTE) SARS-CoV-2 target nucleic acids are NOT DETECTED.  The SARS-CoV-2 RNA is generally detectable in upper respiratory specimens during the acute phase of infection. The lowest concentration of SARS-CoV-2 viral copies this assay can detect is 138 copies/mL. A negative result does not preclude SARS-Cov-2 infection and should not be used as the sole basis for treatment or other patient management decisions. A negative result may occur with  improper specimen collection/handling, submission of specimen other than nasopharyngeal swab, presence of viral mutation(s) within the areas  targeted by this assay, and inadequate number of viral copies(<138 copies/mL). A negative result must be combined with clinical observations, patient history, and epidemiological information. The expected result is Negative.  Fact Sheet for Patients:  EntrepreneurPulse.com.au  Fact Sheet for Healthcare Providers:  IncredibleEmployment.be  This test is no t yet approved or cleared by the [place] FDA and  has been authorized for detection and/or diagnosis of SARS-CoV-2 by FDA under an Emergency Use Authorization (EUA). This EUA will remain  in effect (meaning this test can be used) for the duration of the COVID-19 declaration under Section 564(b)(1) of the Act, 21 U.S.C.section 360bbb-3(b)(1), unless the authorization is terminated  or revoked sooner.       Influenza A by PCR NEGATIVE NEGATIVE Final   Influenza B by PCR NEGATIVE NEGATIVE Final    Comment: (NOTE) The Xpert Xpress SARS-CoV-2/FLU/RSV plus assay is intended as an aid in the diagnosis of influenza from Nasopharyngeal swab specimens and should not be used as a sole basis for treatment. Nasal washings and aspirates are unacceptable for Xpert Xpress SARS-CoV-2/FLU/RSV testing.  Fact Sheet for Patients: EntrepreneurPulse.com.au  Fact Sheet for Healthcare Providers: IncredibleEmployment.be  This test is not yet approved or cleared by the [place] FDA and has been authorized for detection and/or diagnosis of SARS-CoV-2 by FDA under an Emergency Use Authorization (EUA). This EUA will remain in effect (meaning this test can be used) for the duration of the COVID-19 declaration under Section 564(b)(1) of the Act, 21 U.S.C. section 360bbb-3(b)(1), unless the authorization is terminated or revoked.  Performed at [facility], [address] [place], [place] [address]     Lipid Panel Recent Labs    12/05/20 0723  CHOL 196   TRIG 175*  HDL 38*  CHOLHDL 5.2  VLDL 35  LDLCALC 123*    Studies/Results: MR BRAIN WO CONTRAST  Result Date: 12/04/2020 CLINICAL DATA:  Left facial droop and left facial and arm numbness. EXAM: MRI HEAD WITHOUT CONTRAST TECHNIQUE: Multiplanar, multiecho pulse sequences of the brain and surrounding structures were obtained without intravenous contrast. COMPARISON:  None. FINDINGS: Brain: No acute infarct, mass effect or extra-axial collection. No acute or chronic hemorrhage. There is multifocal hyperintense T2-weighted signal within the white matter. Parenchymal volume and CSF spaces are normal. The midline structures are normal. Vascular: Major flow voids are preserved. Skull and upper cervical spine: Normal calvarium and skull base. Visualized upper cervical spine and soft tissues are normal. Sinuses/Orbits:No paranasal sinus fluid levels or advanced mucosal thickening. No mastoid or middle ear effusion. Normal orbits. IMPRESSION: 1. No acute intracranial abnormality. 2. Multifocal hyperintense T2-weighted signal within the white matter, most commonly seen in the setting of chronic microvascular ischemia. Electronically Signed   By: Ulyses Jarred M.D.   On: 12/04/2020 21:37   DG Knee Complete 4 Views Right  Result Date: 12/04/2020  CLINICAL DATA:  Right knee pain EXAM: RIGHT KNEE - COMPLETE 4+ VIEW COMPARISON:  04/17/2020 FINDINGS: Degenerative changes throughout the right knee with joint space narrowing and spurring, most pronounced in the medial and patellofemoral compartments. No joint effusion. No acute bony abnormality. Specifically, no fracture, subluxation, or dislocation. IMPRESSION: Moderate degenerative changes.  No acute bony abnormality. Electronically Signed   By: Rolm Baptise M.D.   On: 12/04/2020 19:42   CT HEAD CODE STROKE WO CONTRAST  Result Date: 12/04/2020 CLINICAL DATA:  Code stroke.  Acute neurologic deficit EXAM: CT HEAD WITHOUT CONTRAST TECHNIQUE: Contiguous axial  images were obtained from the base of the skull through the vertex without intravenous contrast. COMPARISON:  None. FINDINGS: Brain: There is no mass, hemorrhage or extra-axial collection. The size and configuration of the ventricles and extra-axial CSF spaces are normal. The brain parenchyma is normal, without evidence of acute or chronic infarction. Vascular: No abnormal hyperdensity of the major intracranial arteries or dural venous sinuses. No intracranial atherosclerosis. Skull: The visualized skull base, calvarium and extracranial soft tissues are normal. Sinuses/Orbits: No fluid levels or advanced mucosal thickening of the visualized paranasal sinuses. No mastoid or middle ear effusion. The orbits are normal. ASPECTS [facility] Stroke Program Early CT Score) - Ganglionic level infarction (caudate, lentiform nuclei, internal capsule, insula, M1-M3 cortex): 7 - Supraganglionic infarction (M4-M6 cortex): 3 Total score (0-10 with 10 being normal): 10 IMPRESSION: 1. Normal head CT. 2. ASPECTS is 10. These results were called by telephone at the time of interpretation on 12/04/2020 at 6:46 pm to provider [facility] , who verbally acknowledged these results. Electronically Signed   By: Ulyses Jarred M.D.   On: 12/04/2020 18:46   CT ANGIO HEAD NECK W WO CM (CODE STROKE)  Result Date: 12/04/2020 CLINICAL DATA:  Left-sided weakness EXAM: CT ANGIOGRAPHY HEAD AND NECK TECHNIQUE: Multidetector CT imaging of the head and neck was performed using the standard protocol during bolus administration of intravenous contrast. Multiplanar CT image reconstructions and MIPs were obtained to evaluate the vascular anatomy. Carotid stenosis measurements (when applicable) are obtained utilizing NASCET criteria, using the distal internal carotid diameter as the denominator. CONTRAST:  34mL OMNIPAQUE IOHEXOL 350 MG/ML SOLN COMPARISON:  None. FINDINGS: CTA NECK FINDINGS SKELETON: Multilevel ossification of the posterior longitudinal  ligament, greatest at C2-3, with associated spinal canal stenosis. OTHER NECK: Normal pharynx, larynx and major salivary glands. No cervical lymphadenopathy. Unremarkable thyroid gland. UPPER CHEST: No pneumothorax or pleural effusion. No nodules or masses. AORTIC ARCH: There is no calcific atherosclerosis of the aortic arch. There is no aneurysm, dissection or hemodynamically significant stenosis of the visualized portion of the aorta. Conventional 3 vessel aortic branching pattern. The visualized proximal subclavian arteries are widely patent. RIGHT CAROTID SYSTEM: Normal without aneurysm, dissection or stenosis. LEFT CAROTID SYSTEM: Normal without aneurysm, dissection or stenosis. VERTEBRAL ARTERIES: Left dominant configuration. Both origins are clearly patent. There is no dissection, occlusion or flow-limiting stenosis to the skull base (V1-V3 segments). CTA HEAD FINDINGS POSTERIOR CIRCULATION: --Vertebral arteries: Normal V4 segments. --Inferior cerebellar arteries: Normal. --Basilar artery: Normal. --Superior cerebellar arteries: Normal. --Posterior cerebral arteries (PCA): Normal. ANTERIOR CIRCULATION: --Intracranial internal carotid arteries: Normal. --Anterior cerebral arteries (ACA): Normal. Both A1 segments are present. Patent anterior communicating artery (a-comm). --Middle cerebral arteries (MCA): Normal. VENOUS SINUSES: As permitted by contrast timing, patent. ANATOMIC VARIANTS: None Review of the MIP images confirms the above findings. IMPRESSION: 1. No emergent large vessel occlusion or high-grade stenosis of the intracranial or cervical arteries. 2.  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 Electronically Signed   By: Ulyses Jarred M.D.   On: 12/04/2020 20:11    Medications: Scheduled:  acetaminophen  1,000 mg Oral Once   chlorhexidine gluconate (MEDLINE KIT)  15 mL Mouth Rinse BID   cloBAZam  20 mg Oral QHS    enoxaparin (LOVENOX) injection  0.5 mg/kg Subcutaneous Q24H   levETIRAcetam  1,500 mg Oral BID   mouth rinse  15 mL Mouth Rinse 10 times per day   oxcarbazepine  1,200 mg Oral BID   pyridoxine  200 mg Oral Daily   topiramate  200 mg Oral BID   Continuous:  sodium chloride 75 mL/hr at 12/04/20 2229   sodium chloride 75 mL/hr (12/04/20 2219)    Assessment/Recommendations: 49 year old male with a history of epilepsy presenting with acute onset of left sided weakness following a seizure at home.    Acute onset of left sidedweakness:  -Exam significantly improved this morning. Still with decreased contraction of the left perioral muscles.  - MRI brain: No acute intracranial abnormality. 2. Multifocal hyperintense T2-weighted signal within the white matter, most commonly seen in the setting of chronic microvascular ischemia.  - CTA of head and neck showed  no emergent large vessel occlusion or high-grade stenosis of the intracranial or cervical arteries.  - Also seen on CTA was multilevel ossification of the posterior longitudinal ligament, greatest at C2-3 with associated spinal canal stenosis. No findings on today's exam to suggest a myelopathy.  -DDx for presentation now primarily consists of functional/conversion disorder/secondary gain versus postictal weakness.                Recommendations:             - ASA 81 mg po qd             - Outpatient Neurology follow up    Increased seizure frequency at home: - History of epilepsy.  - Takes Onfi, Keppra and Oxcarbazepine at home - DDx for increased seizure frequency includes increased metabolism of meds versus poor absorption versus and intrinsic change in seizure frequency - Being followed at an outside facility for this diagnosis. Per patient, he recently underwent functional MRI and is being worked up for possible epilepsy surgery.              Recommendations:             - Keppra level is pending             - Seizure precautions              - EEG is pending              - Continue home Onfi, Keppra and oxcarbazepine  - Patient had Topamax listed on his home meds per Epic, but he states that he no longer takes it due to side effects. Regarding the pyridoxine listed on his home meds list, he is unaware of ever having been prescribed this vitamin. Discontinuing both of these meds.   Worsened left facial weakness since yesterday's exam:  - Pattern of left facial weakness is now most consistent with a worsening Bell's palsy, which had manifested only as left lower quadrant mild weakness on yesterday's exam. On further interview with the patient, he endorses left ear pain over approximately the past 2 days, but with no vesicles, crusting or drainage. He also endorses abnormal sense of taste  since shortly before his last seizure yesterday.   Recommendations: - Will need add-on MRI brain with contrast to assess for possible left cerebellopontine angle I inflammatory/enhancing lesion.   - Start empiric prednisone for likely idiopathic Bell's palsy, at 60 mg po qd x 6 days, then taper off by 10 mg per day over the next 5 days. Also will need to start empiric valacyclovir at 1000 mg po TID x 7 days.      Addendum: EEG reveals excessive generalized beta activity. Other than this finding, the study is within normal limits. No seizures or epileptiform discharges were seen throughout the recording. The excessive beta activity seen in the background is most likely due to the effect of benzodiazepine and is a benign EEG pattern.   LOS: 1 day   '@Electronically'$  signed: Dr. Kerney Elbe 12/05/2020  11:43 AM

## 2020-12-05 NOTE — Evaluation (Signed)
Physical Therapy Evaluation Patient Details Name: Randy Cordova. MRN: [id number] DOB: 19-Jun-1970 Today's Date: 12/05/2020  History of Present Illness  50 y.o. 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  His last known well of 1500.  He complained of associated headache, unsteadiness on his feet and some mild soreness to the right knee.    He reports compliance with his antiepileptic medication.  Apart from his breakthrough seizures he denies other recent illness such as fever or chills, cough or shortness of breath or chest pain and denies nausea vomiting or diarrhea.  Following his code stroke work-up and on my assessment, patient complained of numbness on his left hemiface with difficulty shutting his left eye and drooling from the left side of his mouth.  Clinical Impression  Patient resting in bed upon arrival to room; alert and oriented.  Endorses resolution of all symptoms, except mild L facial droop.  Bilat UE/LE strength and ROM grossly symmetrical and WFL; no focal weakness, sensory or coordination deficit noted.  Able to complete bed mobility with mod indep; sit/stand, basic transfers and gait (50') pushing IV pole, sup.  Demonstrates broad BOS, reciprocal stepping pattern. Negotiates turns, obstacles without difficulty.  Endorses gait performance at/near baseline for him.  No overt buckling or LOB. Will plan to see for one additional visit to assess safety with stair negotiation (given stairs required to enter home and to access second-level of home); anticipate no formal PT needs beyond this at discharge.       Recommendations for follow up therapy are one component of a multi-disciplinary discharge planning  process, led by the attending physician.  Recommendations may be updated based on patient status, additional functional criteria and insurance authorization.  Follow Up Recommendations No PT follow up    Assistance Recommended at Discharge PRN  Functional Status Assessment Patient has had a recent decline in their functional status and demonstrates the ability to make significant improvements in function in a reasonable and predictable amount of time.  Equipment Recommendations       Recommendations for Other Services       Precautions / Restrictions Precautions Precautions: None Precaution Comments: low fall risk Restrictions Weight Bearing Restrictions: No      Mobility  Bed Mobility Overal bed mobility: Modified Independent                  Transfers Overall transfer level: Modified independent Equipment used: None               General transfer comment: good LE strength/power; broad BOS    Ambulation/Gait Ambulation/Gait assistance: Supervision Gait Distance (Feet): 50 Feet Assistive device: None       General Gait Details: broad BOS, reciprocal stepping pattern. Negotiates turns, obstacles without difficulty.  Endorses gait performance at/near baseline for him  Information systems manager Rankin (Stroke Patients Only)       Balance Overall balance assessment: Needs assistance Sitting-balance support: No upper extremity supported;Feet supported Sitting balance-Leahy Scale: Good     Standing balance support: No upper extremity supported Standing balance-Leahy Scale: Good  Pertinent Vitals/Pain Pain Assessment: No/denies pain    Home Living Family/patient expects to be discharged to:: Private residence Living Arrangements: Non-relatives/Friends Available Help at Discharge: Friend(s);Available PRN/intermittently Type of Home: House Home Access: Stairs to  enter Entrance Stairs-Rails: Right;Left Entrance Stairs-Number of Steps: 6-8 Alternate Level Stairs-Number of Steps: 14 (with landing) Home Layout: Two level;Bed/bath upstairs;Full bath on main level Home Equipment: Cane - single point;Shower seat Additional Comments: spouse is in w/c and lives in [place] per pt    Prior Function Prior Level of Function : Independent/Modified Independent;History of Falls (last six months)             Mobility Comments: Intermittent use of SPC for stairs; multiple fall history related to seizure-history       Hand Dominance   Dominant Hand: Left    Extremity/Trunk Assessment   Upper Extremity Assessment Upper Extremity Assessment: Overall WFL for tasks assessed    Lower Extremity Assessment Lower Extremity Assessment: Overall WFL for tasks assessed (grossly 4+/5 throughout)       Communication   Communication: No difficulties  Cognition Arousal/Alertness: Awake/alert Behavior During Therapy: WFL for tasks assessed/performed Overall Cognitive Status: Within Functional Limits for tasks assessed                                 General Comments: Pt is pleasant and cooperative. Pt is A &O x4. Pt reports symptoms resolving other than L facial droop.        General Comments      Exercises     Assessment/Plan    PT Assessment Patient needs continued PT services  PT Problem List Decreased mobility       PT Treatment Interventions DME instruction;Gait training;Stair training;Functional mobility training;Therapeutic activities;Therapeutic exercise;Balance training;Neuromuscular re-education;Patient/family education    PT Goals (Current goals can be found in the Care Plan section)  Acute Rehab PT Goals Patient Stated Goal: to go home PT Goal Formulation: With patient Time For Goal Achievement: 12/19/20 Potential to Achieve Goals: Good    Frequency Min 2X/week   Barriers to discharge        Co-evaluation                AM-PAC PT "6 Clicks" Mobility  Outcome Measure Help needed turning from your back to your side while in a flat bed without using bedrails?: None Help needed moving from lying on your back to sitting on the side of a flat bed without using bedrails?: None Help needed moving to and from a bed to a chair (including a wheelchair)?: None Help needed standing up from a chair using your arms (e.g., wheelchair or bedside chair)?: None Help needed to walk in hospital room?: None Help needed climbing 3-5 steps with a railing? : A Little 6 Click Score: 23    End of Session   Activity Tolerance: Patient tolerated treatment well Patient left: in bed;with call bell/phone within reach Nurse Communication: Mobility status PT Visit Diagnosis: Other symptoms and signs involving the nervous system (R29.898)    Time: 1610-9604 PT Time Calculation (min) (ACUTE ONLY): 10 min   Charges:   PT Evaluation $PT Eval Low Complexity: 1 Low         Johndavid Geralds H. Manson Passey, PT, DPT, NCS 12/05/20, 3:07 PM [phone]

## 2020-12-05 NOTE — Evaluation (Signed)
Occupational Therapy Evaluation Patient Details Name: Randy Cordova. MRN: [id number] DOB: 04-01-1970 Today's Date: 12/05/2020   History of Present Illness 50 y.o. 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  His last known well of 1500.  He complained of associated headache, unsteadiness on his feet and some mild soreness to the right knee.    He reports compliance with his antiepileptic medication.  Apart from his breakthrough seizures he denies other recent illness such as fever or chills, cough or shortness of breath or chest pain and denies nausea vomiting or diarrhea.  Following his code stroke work-up and on my assessment, patient complained of numbness on his left hemiface with difficulty shutting his left eye and drooling from the left side of his mouth.   Clinical Impression   Upon entering the room, pt supine in bed and agreeable to OT intervention. Pt reports living with roommate and use of SPC for ambulation at baseline secondary to history of epilepsy. Pt's wife lives in [place] and he hopes to move to her location soon but has been concerned with medical issues over the last several years. Pt demonstrated bed mobility, functional ambulation while pushing IV pole, toileting tasks, and LB dressing without assistance this session. No LOB noted. Pt reports feeling close to baseline other than L facial droop. Pt does not need skilled OT intervention and pt is agreeable. OT to SIGN OFF.      Recommendations for follow up therapy are one component of a multi-disciplinary discharge planning process, led by the attending physician.  Recommendations may be updated based on patient status, additional functional criteria and  insurance authorization.   Follow Up Recommendations  No OT follow up    Assistance Recommended at Discharge None  Functional Status Assessment  Patient has not had a recent decline in their functional status  Equipment Recommendations  None recommended by OT       Precautions / Restrictions Precautions Precautions: Fall Precaution Comments: low fall risk      Mobility Bed Mobility Overal bed mobility: Modified Independent                  Transfers Overall transfer level: Modified independent Equipment used: None               General transfer comment: No phsycial assistanc eprovided and pt pushing IV pole for ambulating to bathroom without LOB      Balance Overall balance assessment: Modified Independent                                         ADL either performed or assessed with clinical judgement   ADL Overall ADL's : Modified independent;At baseline                                             Vision Patient Visual Report: No change from baseline              Pertinent Vitals/Pain Pain Assessment: Faces Faces Pain Scale: Hurts little more Pain  Location: head Pain Descriptors / Indicators: Aching Pain Intervention(s): Monitored during session;Patient requesting pain meds-RN notified     Hand Dominance Left   Extremity/Trunk Assessment Upper Extremity Assessment Upper Extremity Assessment: Overall WFL for tasks assessed   Lower Extremity Assessment Lower Extremity Assessment: Overall WFL for tasks assessed       Communication Communication Communication: No difficulties   Cognition Arousal/Alertness: Awake/alert Behavior During Therapy: WFL for tasks assessed/performed Overall Cognitive Status: Within Functional Limits for tasks assessed                                 General Comments: Pt is pleasant and cooperative. Pt is A &O x4. Pt reports symptoms resolving other than L  facial droop.                Home Living Family/patient expects to be discharged to:: Private residence Living Arrangements: Non-relatives/Friends Available Help at Discharge: Friend(s);Available PRN/intermittently Type of Home: House Home Access: Stairs to enter [employer] of Steps: 6-8 Entrance Stairs-Rails: Right;Left Home Layout: Two level;Bed/bath upstairs;Full bath on main level Alternate Level Stairs-Number of Steps: 14   Bathroom Shower/Tub: Chief Strategy Officer: Standard     Home Equipment: Cane - single point;Shower seat   Additional Comments: spouse is in w/c and lives in [place] per pt      Prior Functioning/Environment Prior Level of Function : Independent/Modified Independent                                OT Goals(Current goals can be found in the care plan section) Acute Rehab OT Goals Patient Stated Goal: to go home OT Goal Formulation: With patient Time For Goal Achievement: 12/05/20 Potential to Achieve Goals: Good   AM-PAC OT "6 Clicks" Daily Activity     Outcome Measure Help from another person eating meals?: None Help from another person taking care of personal grooming?: None Help from another person toileting, which includes using toliet, bedpan, or urinal?: None Help from another person bathing (including washing, rinsing, drying)?: None Help from another person to put on and taking off regular upper body clothing?: None Help from another person to put on and taking off regular lower body clothing?: None 6 Click Score: 24   End of Session Nurse Communication: Mobility status  Activity Tolerance: Patient tolerated treatment well Patient left: in bed;with call bell/phone within reach;Other (comment) (seizure pads placed on bed rails)                   Time: 1700-1749 OT Time Calculation (min): 21 min Charges:  OT General Charges $OT Visit: 1 Visit OT Evaluation $OT Eval Low Complexity: 1 Low OT  Treatments $Self Care/Home Management : 8-22 mins  Jackquline [place], MS, OTR/L , CBIS ascom [phone]  12/05/20, 10:11 AM

## 2020-12-05 NOTE — Evaluation (Signed)
Clinical/Bedside Swallow Evaluation Patient Details  Name: Randy Cordova. MRN: [id number] Date of Birth: [birth year]  Today's Date: 12/05/2020 Time: SLP Start Time (ACUTE ONLY): 1235 SLP Stop Time (ACUTE ONLY): 1325 SLP Time Calculation (min) (ACUTE ONLY): 50 min  Past Medical History:  Past Medical History:  Diagnosis Date   Epilepsy (HCC)    OSA on CPAP    TBI (traumatic brain injury)    Thrombocytopenia (HCC)    Past Surgical History:  Past Surgical History:  Procedure Laterality Date   BACK SURGERY     CHOLECYSTECTOMY     Head surgery     Due to brain injury   KNEE SURGERY     HPI:  50 y.o. 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  His last known well of 1500.  He complained of associated headache, unsteadiness on his feet and some mild soreness to the right knee.    He reports compliance with his antiepileptic medication.  Apart from his breakthrough seizures he denies other recent illness such as fever or chills, cough or shortness of breath or chest pain and denies nausea vomiting or diarrhea.  Following his code stroke work-up(MRI Negative), patient complained of numbness on his left hemiface with difficulty shutting his left eye and drooling from the left side of his mouth.  This following day, patient feels that his left side is stronger today and almost at baseline. No seizures overnight.  Complains of left ear pain. Left facial weakness has gotten worse overnight. Also states that he his sense of taste has not been normal since shortly before his seizure yesterday.  MD has started patient on Medications for Bell's Palsy.    Assessment / Plan / Recommendation  Clinical Impression  Pt appears to  present w/ adequate opharyngeal phase swallow function w/ No pharyngeal phase dysphagia noted, No neuromuscular deficits noted during the pharyngeal phase of swallowing. Pt exhibits Left facial/labial weakness and decreased tone d/t Bell's Palsy resulting in min decreased labial closure and drooling of saliva b/t po trials and during rest. The Left facial/labial decreased tone has worsened since yesterday per MD; MD is addressing w/ Medications today. Neurology has followed; MRI is negative. Pt consumed po trials w/ No overt, clinical s/s of aspiration during po trials. Pt appears at reduced risk for aspiration following general aspiration precautions and following strategies for supporting the oral phase of swallowing. During po trials, pt consumed all consistencies w/ no overt coughing, decline in vocal quality, or change in respiratory presentation during/post trials. O2 sats remained 98%. Oral phase was c/b min increased Time w/ mastication of solids -- more open mouth chewing w/ less labial seal resulting in Left labial residue in corner of mouth, slight drooling intermittently. Pt was encouraged to use Small bites and sip, moistened foods well to aid the oral phase of swallowing. Pt utilized straw drinking (on Right side of mouth) for improved, tight labial seal when drinking. He exhibited timely bolus management and control of bolus propulsion for A-P transfer for swallowing w/ liquids, purees and softened solids. Oral clearing achieved w/ all trial consistencies. OM Exam as described: Left facial/labial decreased tone/strength/ROM. Speech mildly dysarthric d/t decreased labial tone/seal. Pt fed self.    Recommend continue a  Regular consistency diet w/ well-Cut meats, moistened foods; Thin liquids. Recommend general aspiration precautions, Pills WHOLE in Puree if easier for swallowing. Education given on Pills in Puree; food consistencies and easy to eat options; general aspiration precautions to include  SMALL bites/sips for best oral control. Handouts given on Bell's Palsy -- NO OM exercises are recommended for Bell's Palsy as it is a viral infection. MD updated at bedside. SLP Visit Diagnosis: Dysphagia, oral phase (R13.11) (min -- Bell's Palsy impact)    Aspiration Risk   (reduced following precautions)    Diet Recommendation  Regular consistency diet w/ well-Cut meats, moistened foods; Thin liquids. Recommend general aspiration precautions, to include SMALL bites/sips for best oral control and monitoring any labial leakage on Left side w/ a napkin  Medication Administration: Whole meds with puree (if easier for swallowing/oral phase)    Other  Recommendations Recommended Consults:  (Dietician f/u) Oral Care Recommendations: Oral care BID;Patient independent with oral care Other Recommendations:  (n/a)    Recommendations for follow up therapy are one component of a multi-disciplinary discharge planning process, led by the attending physician.  Recommendations may be updated based on patient status, additional functional criteria and insurance authorization.  Follow up Recommendations None      Frequency and Duration  (n/a)   (n/a)       Prognosis Prognosis for Safe Diet Advancement: Good Barriers/Prognosis Comment: bell's palsy      Swallow Study   General Date of Onset: 12/04/20 HPI: 50 y.o. 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  His last known well of 1500.  He complained of associated headache, unsteadiness on his feet and some mild soreness to the right knee.    He reports compliance with his antiepileptic medication.  Apart from his breakthrough seizures he denies other recent illness such as  fever or chills, cough or shortness of breath or chest pain and denies nausea vomiting or diarrhea.  Following his code stroke work-up(MRI Negative), patient complained of numbness on his left hemiface with difficulty shutting his left eye and drooling from the left side of his mouth.  This following day, patient feels that his left side is stronger today and almost at baseline. No seizures overnight.  Complains of left ear pain. Left facial weakness has gotten worse overnight. Also states that he his sense of taste has not been normal since shortly before his seizure yesterday.  MD has started patient on Medications for Bell's Palsy. Type of Study: Bedside Swallow Evaluation Previous Swallow Assessment: none Diet Prior to this Study: Regular;Thin liquids Temperature Spikes Noted: No (wbc 4.9) Respiratory Status: Room air History of Recent Intubation: No Behavior/Cognition: Alert;Cooperative;Pleasant mood Oral Cavity Assessment: Within Functional Limits Oral Care Completed by SLP: Recent completion by staff Oral Cavity - Dentition: Adequate natural dentition Vision: Functional for self-feeding (though pt reports reduced vision d/t seizure meds) Self-Feeding Abilities: Able to feed self Patient Positioning: Upright in bed (EOB) Baseline Vocal Quality: Normal Volitional Cough: Strong Volitional Swallow: Able to elicit    Oral/Motor/Sensory Function Overall Oral Motor/Sensory Function: Mild impairment (-Moderate weakness -- Left facial/labial) Facial ROM: Reduced left;Suspected CN VII (facial) dysfunction Facial Symmetry: Abnormal symmetry left;Suspected CN VII (facial) dysfunction Facial Strength: Reduced left;Suspected CN VII (facial)  dysfunction Facial Sensation: Reduced left;Suspected CN V (Trigeminal) dysfunction Lingual ROM: Within Functional Limits Lingual Symmetry: Within Functional Limits Lingual Strength: Within Functional Limits Lingual Sensation: Within Functional Limits Velum:  Within Functional Limits Mandible: Within Functional Limits   Ice Chips Ice chips: Not tested   Thin Liquid Thin Liquid: Within functional limits Presentation: Self Fed;Straw (single sips -- 10) Other Comments: tended to put straw on Right side of mouth    Nectar Thick Nectar Thick Liquid: Not tested   Honey Thick Honey Thick Liquid: Not tested   Puree Puree: Within functional limits Presentation: Spoon;Self Fed (3 trials)   Solid     Solid: Impaired Presentation: Self Fed;Spoon (7 trials) Oral Phase Impairments: Reduced labial seal (w/ increased mastication movements) Oral Phase Functional Implications:  (min prolonged) Pharyngeal Phase Impairments:  (none)        Jerilynn Som, MS, Science writer Language Pathologist Rehab Services [phone] Randy Cordova 12/05/2020,4:42 PM

## 2020-12-05 NOTE — Progress Notes (Signed)
Patient was alert and oriented upon arrival. Talkative and shared feelings freely. Pt's wife is in [place] and is hopeful of joining her soon. Is coping with decline in health over last two years. Spiritual care was provided through active listening, validation of feelings, normalizing emotions, being a non-anxious presence and prayer. Spiritual care visit was appreciated.   Rev. Casilda Carls, M.Div. Healthcare Chaplain

## 2020-12-06 ENCOUNTER — Inpatient Hospital Stay: Payer: Medicaid Other

## 2020-12-06 DIAGNOSIS — G510 Bell's palsy: Secondary | ICD-10-CM | POA: Diagnosis not present

## 2020-12-06 DIAGNOSIS — G8194 Hemiplegia, unspecified affecting left nondominant side: Secondary | ICD-10-CM | POA: Diagnosis not present

## 2020-12-06 DIAGNOSIS — G40919 Epilepsy, unspecified, intractable, without status epilepticus: Secondary | ICD-10-CM | POA: Diagnosis not present

## 2020-12-06 LAB — LEVETIRACETAM LEVEL: Levetiracetam Lvl: 16.2 ug/mL (ref 10.0–40.0)

## 2020-12-06 MED ORDER — GADOBUTROL 1 MMOL/ML IV SOLN
10.0000 mL | Freq: Once | INTRAVENOUS | Status: AC | PRN
  Administered 2020-12-06: 10 mL via INTRAVENOUS

## 2020-12-06 MED ORDER — PREDNISONE 20 MG PO TABS: ORAL_TABLET | ORAL | 0 refills | Status: DC

## 2020-12-06 MED ORDER — VALACYCLOVIR HCL 1 G PO TABS: 1000.0000 mg | ORAL_TABLET | Freq: Three times a day (TID) | ORAL | 0 refills | Status: DC

## 2020-12-06 NOTE — Discharge Instructions (Signed)
Advised to take valacyclovir 1 g 3 times daily for 7 days Advised to take prednisone taper for 7 days.

## 2020-12-06 NOTE — ED Notes (Signed)
Pt dc instructions and scripts reviewed no questions or concerns. Pt stated he feels comfortable being discharged home. Pt placed in wheelchair and transport out of ED sitting on bench outside waiting for ride.

## 2020-12-06 NOTE — Progress Notes (Signed)
Speech Language Pathology Treatment: Dysphagia  Patient Details Name: Randy Cordova. MRN: [id number] DOB: 08/23/1970 Today's Date: 12/06/2020 Time: 1884-1660 SLP Time Calculation (min) (ACUTE ONLY): 35 min  Assessment / Plan / Recommendation Clinical Impression  Per Neurology note today re: pt's s/s c/w Bell's Palsy: "MRI brain: No acute intracranial abnormality. Multifocal hyperintense T2-weighted signal within the white matter, most commonly seen in the setting of chronic microvascular ischemia.  - Follow up MRI with contrast reveals enhancement of the left seventh cranial nerve at the left IAC fundus and labyrinthine segment, consistent with neuritis.".  Pt seen today for education on Bell's Palsy and gentle facial/labial ROM and stimulation movements including addressing reduced movement on pt's Left side of face. Noted slight min more movements during eye blinking during session; min more labial ROM in Left corner of mouth - no drooling while talking or at rest which was pt's goal at eval yesterday. Discussed handouts on Bell's Palsy and practiced ROM exercises, gentle touch ROM. Pt demonstrated appropriate follow through w/ instruction.  Discussed easy to eat foods, prep of foods for ease of mastication and oral intake. Discussed general aspiration precautions. Pt was verbally engaged in the above and appreciated the handouts. No further skilled ST services indicated. MD to reconsult ST services if new needs while admitted.     HPI  50 y.o. 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  His last known well of 1500.  He complained of associated headache, unsteadiness on his feet and some mild soreness to the right knee.     He reports compliance with his antiepileptic medication.  Apart from his breakthrough seizures he denies other recent illness such as fever or chills, cough or shortness of breath or chest pain and denies nausea vomiting or diarrhea.  Following his code stroke work-up(MRI Negative), patient complained of numbness on his left hemiface with difficulty shutting his left eye and drooling from the left side of his mouth.  This following day, patient feels that his left side is stronger today and almost at baseline. No seizures overnight.  Complains of left ear pain. Left facial weakness has gotten worse overnight. Also states that he his sense of taste has not been normal since shortly before his seizure yesterday.  MD has started patient on Medications for Bell's Palsy.        SLP Plan  All goals met      Recommendations for follow up therapy are one component of a multi-disciplinary discharge planning process, led by the attending physician.  Recommendations may be updated based on patient status, additional functional criteria and insurance authorization.    Recommendations  Diet recommendations: Regular;Thin liquid (cut meats/foods; moistened well) Liquids provided via: Cup;Straw (monitor) Medication Administration: Whole meds with puree (if easier for swallowing/oral phase) Supervision: Patient able to self feed Compensations: Minimize environmental distractions;Slow rate;Small sips/bites;Lingual sweep for clearance of pocketing;Follow solids with liquid Postural Changes and/or Swallow Maneuvers: Out of bed for meals;Seated upright 90 degrees;Upright 30-60 min after meal                General recommendations:  (Neurology or ENT if needed for f/u w/ Bell's Palsy) Oral Care Recommendations: Oral care BID;Patient independent with oral care Follow up Recommendations:  None SLP Visit Diagnosis: Dysphagia, oral phase (R13.11) Plan: All goals met       GO                Orinda Kenner, MS, CCC-SLP Speech Language Pathologist [facility] [phone] [facility]  12/06/2020, 2:08 PM

## 2020-12-06 NOTE — ED Notes (Signed)
Patient refusing IV fluids at this time.

## 2020-12-06 NOTE — Discharge Summary (Signed)
Physician Discharge Summary  [name]. UMP:[id number] DOB: 1971-01-14 DOA: 12/04/2020  PCP: [facility]  Admit date: 12/04/2020  Discharge date: 12/06/2020  Admitted From: Home.  Disposition:  Home  Recommendations for Outpatient Follow-up:  Follow up with PCP in 1-2 weeks. Please obtain BMP/CBC in one week. Advised to take valacyclovir 1 g 3 times daily for 7 days. Advised to take prednisone taper for 7 days.  Home Health: None Equipment/Devices: None  Discharge Condition: Stable CODE STATUS:Full code Diet recommendation: Heart Healthy   Brief Summary / Hospital Course: This 50 y.o. male with medical history significant for morbid obesity, degenerative joint disease, OSA on CPAP, epilepsy refractory to treatment on multiple antiepileptics, who presented to the hospital after breakthrough seizures and left-sided weakness.  He said he had about 5 seizures since Friday, 12/01/2020.  He said his roommate caught one of the seizures on camera.  He had one breakthrough seizure a day prior to admission.  He slept after the seizure.  When he woke up he noticed left-sided weakness. Code stroke was called in the emergency room for concern for tPA. tPA was not administered because patient declined.  CT head and MRI brain without contrast did not show any evidence of stroke.  He has left facial palsy suspicious for Bell's palsy.  Patient was admitted for breakthrough seizures and for CVA.  Patient was seen by neurology.  Stroke was ruled out.  Patient was continued on seizure medications.  Neurologist recommended to continue Onfi, Keppra, oxcarbazepine.  Left-sided weakness has completely resolved.  Patient feels better and wants to be discharged.  Patient is cleared from neurology.  Neurologist recommended valacyclovir 1 g 3 times daily for 1 week , prednisone taper for 7 days.  Advised to follow-up with outpatient neurology.  He was managed for below problems.  Discharge Diagnoses:   Active Problems:   Breakthrough seizures, recurrent (HCC)   OSA on CPAP   Epilepsy (HCC)   Acute left hemiparesis (HCC)   Morbid obesity with BMI of 50.0-59.9, adult (HCC)  Epilepsy with breakthrough seizures:  Continue antiepileptics.  He is on Keppra, oxcarbazepine and Onfi.   Patient said he had been weaned off of Topamax.  No evidence of acute stroke on MRI brain without contrast. Neurologist recommended patient can be discharged on seizure medications. Patient will follow up with outpatient neurology.   Left Bell's palsy: has been started on prednisone and valacyclovir.  MRI ruled out a stroke.   OSA: Continue CPAP at night    Discharge Instructions  Discharge Instructions     Call MD for:  difficulty breathing, headache or visual disturbances   Complete by: As directed    Call MD for:  persistant dizziness or light-headedness   Complete by: As directed    Call MD for:  persistant nausea and vomiting   Complete by: As directed    Diet - low sodium heart healthy   Complete by: As directed    Diet - low sodium heart healthy   Complete by: As directed    Diet Carb Modified   Complete by: As directed    Discharge instructions   Complete by: As directed    Advised to take valacyclovir 1 g 3 times daily for 7 days Advised to take prednisone taper for 7 days.   Increase activity slowly   Complete by: As directed    Increase activity slowly   Complete by: As directed       Allergies as of  12/06/2020       Reactions   Other Other (See Comments)   Uncoded Allergy. Allergen: ARTIFICIAL SWEETNERS   Aspartame And Phenylalanine    Headache        Medication List     STOP taking these medications    meloxicam 15 MG tablet Commonly known as: MOBIC   oxyCODONE 5 MG immediate release tablet Commonly known as: Oxy IR/ROXICODONE       TAKE these medications    cloBAZam 20 MG tablet Commonly known as: ONFI Take 1 tablet by mouth at bedtime. What changed:  Another medication with the same name was removed. Continue taking this medication, and follow the directions you see here.   levETIRAcetam 750 MG tablet Commonly known as: KEPPRA Take 2 tablets (1,500 mg total) by mouth 2 (two) times daily.   oxcarbazepine 600 MG tablet Commonly known as: TRILEPTAL Take 2 tablets by mouth in the morning and at bedtime. What changed: Another medication with the same name was removed. Continue taking this medication, and follow the directions you see here.   predniSONE 20 MG tablet Commonly known as: DELTASONE Advised to take 60 mg prednisone for 1 day then 50 mg prednisone for 1 day then 40 mg prednisone for 1 day then 30 mg prednisone for 1 day then 20 mg prednisone for 1 day then 10 mg prednisone for 1 day then discontinue.   pyridoxine 200 MG tablet Commonly known as: B-6 Take 1 tablet (200 mg total) by mouth daily.   topiramate 200 MG tablet Commonly known as: TOPAMAX Take 200 mg by mouth 2 (two) times daily.   valACYclovir 1000 MG tablet Commonly known as: VALTREX Take 1 tablet (1,000 mg total) by mouth 3 (three) times daily.        Follow-up Information     [facility] Follow up in 1 week(s).   Contact information: [address] [place] [address] [phone]         [facility] Follow up in 1 week(s).                 Allergies  Allergen Reactions   Other Other (See Comments)    Uncoded Allergy. Allergen: ARTIFICIAL SWEETNERS   Aspartame And Phenylalanine     Headache    Consultations: Neurology   Procedures/Studies: MR BRAIN WO CONTRAST  Result Date: 12/04/2020 CLINICAL DATA:  Left facial droop and left facial and arm numbness. EXAM: MRI HEAD WITHOUT CONTRAST TECHNIQUE: Multiplanar, multiecho pulse sequences of the brain and surrounding structures were obtained without intravenous contrast. COMPARISON:  None. FINDINGS: Brain: No acute infarct, mass effect or extra-axial collection. No  acute or chronic hemorrhage. There is multifocal hyperintense T2-weighted signal within the white matter. Parenchymal volume and CSF spaces are normal. The midline structures are normal. Vascular: Major flow voids are preserved. Skull and upper cervical spine: Normal calvarium and skull base. Visualized upper cervical spine and soft tissues are normal. Sinuses/Orbits:No paranasal sinus fluid levels or advanced mucosal thickening. No mastoid or middle ear effusion. Normal orbits. IMPRESSION: 1. No acute intracranial abnormality. 2. Multifocal hyperintense T2-weighted signal within the white matter, most commonly seen in the setting of chronic microvascular ischemia. Electronically Signed   By: Deatra Robinson M.D.   On: 12/04/2020 21:37   MR BRAIN W CONTRAST  Result Date: 12/06/2020 CLINICAL DATA:  Cranial neuropathy. EXAM: MRI HEAD WITH CONTRAST TECHNIQUE: Multiplanar, multiecho pulse sequences of the brain and surrounding structures were obtained with intravenous contrast.  CONTRAST:  10mL GADAVIST GADOBUTROL 1 MMOL/ML IV SOLN COMPARISON:  Head CT December 04, 2020. FINDINGS: Brain: Dedicated images with IAC protocol show enhancement of the left seventh cranial nerve at the left IAC fundus and labyrinthine segment, consistent with neuritis (series 10, image 44; series 12, image 17). No cerebellopontine angle or internal auditory canal mass lesion is demonstrated.Normal appearance of the 7th and 8th cranial nerves on the right. Vascular: Normal contrast opacification the major vessels. Skull and upper cervical spine: Negative. Sinuses/Orbits: Negative. IMPRESSION: Contrast enhancement of the left that cranial nerve at the left IAC fundus and labyrinthine segment, consistent with neuritis. Electronically Signed   By: Baldemar Lenis M.D.   On: 12/06/2020 09:58   DG Knee Complete 4 Views Right  Result Date: 12/04/2020 CLINICAL DATA:  Right knee pain EXAM: RIGHT KNEE - COMPLETE 4+ VIEW COMPARISON:   04/17/2020 FINDINGS: Degenerative changes throughout the right knee with joint space narrowing and spurring, most pronounced in the medial and patellofemoral compartments. No joint effusion. No acute bony abnormality. Specifically, no fracture, subluxation, or dislocation. IMPRESSION: Moderate degenerative changes.  No acute bony abnormality. Electronically Signed   By: Charlett Nose M.D.   On: 12/04/2020 19:42   EEG adult  Result Date: 12/05/2020 Charlsie Quest, MD     12/05/2020  3:50 PM Patient Name: Nickolis Diel. MRN: [id number] Epilepsy Attending: Charlsie Quest Referring Physician/Provider: Dr Caryl Pina Date: 12/05/2020 Duration: 28.24 mins Patient history: 50 year old male with history of epilepsy presented with acute onset left-sided weakness following a seizure at home.  EEG to evaluate for seizures. Level of alertness: Awake, asleep AEDs during EEG study: OXC, TPM, LEV, Onfi Technical aspects: This EEG study was done with scalp electrodes positioned according to the 10-20 International system of electrode placement. Electrical activity was acquired at a sampling rate of  and reviewed with a high frequency filter of  and a low frequency filter of . EEG data were recorded continuously and digitally stored. Description: The posterior dominant rhythm consists of 9 Hz activity of moderate voltage (25-35 uV) seen predominantly in posterior head regions, symmetric and reactive to eye opening and eye closing. Sleep was characterized by vertex waves, sleep spindles (12 to 14 Hz), maximal frontocentral region. There is an excessive amount of 15 to 18 Hz beta activity  distributed symmetrically and diffusely. Physiologic photic driving was not seen during photic stimulation. Hyperventilation was not performed.    ABNORMALITY - Excessive beta, generalized  IMPRESSION: This study is within normal limits. No seizures or epileptiform discharges were seen throughout the recording. The excessive  beta activity seen in the background is most likely due to the effect of benzodiazepine and is a benign EEG pattern.   Charlsie Quest   ECHOCARDIOGRAM COMPLETE  Result Date: 12/05/2020    ECHOCARDIOGRAM REPORT   Patient Name:   Jhonnie Aliano. Date of Exam: 12/05/2020 Medical Rec #:  [id number]          Height:       72.0 in Accession #:    [id number]         Weight:       430.0 lb Date of Birth:  July 25, 1970           BSA:          2.950 m Patient Age:    50 years           BP:  123/69 mmHg Patient Gender: M                  HR:           77 bpm. Exam Location:  [facility] Procedure: 2D Echo, Cardiac Doppler and Color Doppler Indications:     Stroke I63.9  History:         Patient has no prior history of Echocardiogram examinations.                  OSA.  Sonographer:     Cristela Blue Referring Phys:  [id number] Andris Baumann Diagnosing Phys: Yvonne Kendall MD  Sonographer Comments: No apical window and no subcostal window. IMPRESSIONS  1. Left ventricular ejection fraction, by estimation, is 45 to 50%. The left ventricle has mildly decreased function. Left ventricular endocardial border not optimally defined to evaluate regional wall motion. There is mild left ventricular hypertrophy.  Left ventricular diastolic function could not be evaluated.  2. Right ventricular systolic function was not well visualized. The right ventricular size is not well visualized. Tricuspid regurgitation signal is inadequate for assessing PA pressure.  3. The mitral valve is abnormal. Unable to accurately assess mitral valve regurgitation.  4. The aortic valve has an indeterminant number of cusps. Aortic valve regurgitation not well assessed. FINDINGS  Left Ventricle: Left ventricular ejection fraction, by estimation, is 45 to 50%. The left ventricle has mildly decreased function. Left ventricular endocardial border not optimally defined to evaluate regional wall motion. The left ventricular internal cavity size was normal in size.  There is mild left ventricular hypertrophy. Left ventricular diastolic function could not be evaluated. Right Ventricle: The right ventricular size is not well visualized. No increase in right ventricular wall thickness. Right ventricular systolic function was not well visualized. Tricuspid regurgitation signal is inadequate for assessing PA pressure. Left Atrium: Left atrial size was not well visualized. Right Atrium: Right atrial size was not well visualized. Pericardium: The pericardium was not well visualized. Mitral Valve: The mitral valve is abnormal. There is moderate thickening of the mitral valve leaflet(s). Unable to accurately assess mitral valve regurgitation. Tricuspid Valve: The tricuspid valve is not well visualized. Tricuspid valve regurgitation is not demonstrated. Aortic Valve: The aortic valve has an indeterminant number of cusps. Aortic valve regurgitation not well assessed. Pulmonic Valve: The pulmonic valve was not well visualized. Pulmonic valve regurgitation is not visualized. No evidence of pulmonic stenosis. Aorta: The aortic root is normal in size and structure. Pulmonary Artery: The pulmonary artery is not well seen. IAS/Shunts: The interatrial septum was not well visualized.  LEFT VENTRICLE PLAX 2D LVIDd:         5.12 cm LVIDs:         3.90 cm LV PW:         1.22 cm LV IVS:        1.27 cm LVOT diam:     2.30 cm LVOT Area:     4.15 cm  LEFT ATRIUM         Index LA diam:    4.10 cm 1.39 cm/m                        PULMONIC VALVE AORTA                 PV Vmax:        0.75 m/s Ao Root diam: 3.43 cm PV Vmean:       49.250 cm/s  PV VTI:         0.149 m                       PV Peak grad:   2.3 mmHg                       PV Mean grad:   1.0 mmHg                       RVOT Peak grad: 4 mmHg   SHUNTS Systemic Diam: 2.30 cm Pulmonic VTI:  0.180 m Yvonne Kendall MD Electronically signed by Yvonne Kendall MD Signature Date/Time: 12/05/2020/3:38:32 PM    Final    CT HEAD  CODE STROKE WO CONTRAST  Result Date: 12/04/2020 CLINICAL DATA:  Code stroke.  Acute neurologic deficit EXAM: CT HEAD WITHOUT CONTRAST TECHNIQUE: Contiguous axial images were obtained from the base of the skull through the vertex without intravenous contrast. COMPARISON:  None. FINDINGS: Brain: There is no mass, hemorrhage or extra-axial collection. The size and configuration of the ventricles and extra-axial CSF spaces are normal. The brain parenchyma is normal, without evidence of acute or chronic infarction. Vascular: No abnormal hyperdensity of the major intracranial arteries or dural venous sinuses. No intracranial atherosclerosis. Skull: The visualized skull base, calvarium and extracranial soft tissues are normal. Sinuses/Orbits: No fluid levels or advanced mucosal thickening of the visualized paranasal sinuses. No mastoid or middle ear effusion. The orbits are normal. ASPECTS [facility] Stroke Program Early CT Score) - Ganglionic level infarction (caudate, lentiform nuclei, internal capsule, insula, M1-M3 cortex): 7 - Supraganglionic infarction (M4-M6 cortex): 3 Total score (0-10 with 10 being normal): 10 IMPRESSION: 1. Normal head CT. 2. ASPECTS is 10. These results were called by telephone at the time of interpretation on 12/04/2020 at 6:46 pm to provider [facility] , who verbally acknowledged these results. Electronically Signed   By: Deatra Robinson M.D.   On: 12/04/2020 18:46   CT ANGIO HEAD NECK W WO CM (CODE STROKE)  Result Date: 12/04/2020 CLINICAL DATA:  Left-sided weakness EXAM: CT ANGIOGRAPHY HEAD AND NECK TECHNIQUE: Multidetector CT imaging of the head and neck was performed using the standard protocol during bolus administration of intravenous contrast. Multiplanar CT image reconstructions and MIPs were obtained to evaluate the vascular anatomy. Carotid stenosis measurements (when applicable) are obtained utilizing NASCET criteria, using the distal internal carotid diameter as the  denominator. CONTRAST:  85mL OMNIPAQUE IOHEXOL 350 MG/ML SOLN COMPARISON:  None. 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 OTHER NECK: Normal pharynx, larynx and major salivary glands. No cervical lymphadenopathy. Unremarkable thyroid gland. UPPER CHEST: No pneumothorax or pleural effusion. No nodules or masses. AORTIC ARCH: There is no calcific atherosclerosis of the aortic arch. There is no aneurysm, dissection or hemodynamically significant stenosis of the visualized portion of the aorta. Conventional 3 vessel aortic branching pattern. The visualized proximal subclavian arteries are widely patent. RIGHT CAROTID SYSTEM: Normal without aneurysm, dissection or stenosis. LEFT CAROTID SYSTEM: Normal without aneurysm, dissection or stenosis. VERTEBRAL ARTERIES: Left dominant configuration. Both origins are clearly patent. There is no dissection, occlusion or flow-limiting stenosis to the skull base (V1-V3 segments). CTA HEAD FINDINGS POSTERIOR CIRCULATION: --Vertebral arteries: Normal V4 segments. --Inferior cerebellar arteries: Normal. --Basilar artery: Normal. --Superior cerebellar arteries: Normal. --Posterior cerebral arteries (PCA): Normal. ANTERIOR CIRCULATION: --Intracranial internal carotid arteries: Normal. --Anterior cerebral arteries (  ACA): Normal. Both A1 segments are present. Patent anterior communicating artery (a-comm). --Middle cerebral arteries (MCA): Normal. VENOUS SINUSES: As permitted by contrast timing, patent. ANATOMIC VARIANTS: None Review of the MIP images confirms the above findings. IMPRESSION: 1. No emergent large vessel occlusion or high-grade stenosis of the intracranial or cervical arteries. 2. 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 Electronically Signed   By: Deatra Robinson M.D.   On: 12/04/2020  20:11    Subjective: Patient was seen and examined at bedside.  Overnight events noted.  Patient reports feeling much improved.  left-sided weakness is resolved.  Patient want to be discharged.  Discharge Exam: Vitals:   12/06/20 1145 12/06/20 1400  BP: 136/71 (!) 158/76  Pulse: 79 82  Resp: 17 18  Temp:  98.7 F (37.1 C)  SpO2: 97% 99%   Vitals:   12/06/20 0734 12/06/20 0945 12/06/20 1145 12/06/20 1400  BP: (!) 142/72 (!) 155/94 136/71 (!) 158/76  Pulse: 81 80 79 82  Resp: 19 (!) 21 17 18   Temp: 98.7 F (37.1 C)   98.7 F (37.1 C)  TempSrc: Oral   Oral  SpO2: 98% 100% 97% 99%  Weight:      Height:        General: Pt is alert, awake, not in acute distress Cardiovascular: RRR, S1/S2 +, no rubs, no gallops Respiratory: CTA bilaterally, no wheezing, no rhonchi Abdominal: Soft, NT, ND, bowel sounds + Extremities: no edema, no cyanosis    The results of significant diagnostics from this hospitalization (including imaging, microbiology, ancillary and laboratory) are listed below for reference.     Microbiology: Recent Results (from the past 240 hour(s))  Resp Panel by RT-PCR (Flu A&B, Covid) Nasopharyngeal Swab     Status: None   Collection Time: 12/04/20  8:30 PM   Specimen: Nasopharyngeal Swab; Nasopharyngeal(NP) swabs in vial transport medium  Result Value Ref Range Status   SARS Coronavirus 2 by RT PCR NEGATIVE NEGATIVE Final    Comment: (NOTE) SARS-CoV-2 target nucleic acids are NOT DETECTED.  The SARS-CoV-2 RNA is generally detectable in upper respiratory specimens during the acute phase of infection. The lowest concentration of SARS-CoV-2 viral copies this assay can detect is 138 copies/mL. A negative result does not preclude SARS-Cov-2 infection and should not be used as the sole basis for treatment or other patient management decisions. A negative result may occur with  improper specimen collection/handling, submission of specimen other than nasopharyngeal  swab, presence of viral mutation(s) within the areas targeted by this assay, and inadequate number of viral copies(<138 copies/mL). A negative result must be combined with clinical observations, patient history, and epidemiological information. The expected result is Negative.  Fact Sheet for Patients:  BloggerCourse.com  Fact Sheet for Healthcare Providers:  SeriousBroker.it  This test is no t yet approved or cleared by the [place] FDA and  has been authorized for detection and/or diagnosis of SARS-CoV-2 by FDA under an Emergency Use Authorization (EUA). This EUA will remain  in effect (meaning this test can be used) for the duration of the COVID-19 declaration under Section 564(b)(1) of the Act, 21 U.S.C.section 360bbb-3(b)(1), unless the authorization is terminated  or revoked sooner.       Influenza A by PCR NEGATIVE NEGATIVE Final   Influenza B by PCR NEGATIVE NEGATIVE Final    Comment: (NOTE) The Xpert Xpress SARS-CoV-2/FLU/RSV plus assay is intended as an aid in the diagnosis of influenza from Nasopharyngeal  swab specimens and should not be used as a sole basis for treatment. Nasal washings and aspirates are unacceptable for Xpert Xpress SARS-CoV-2/FLU/RSV testing.  Fact Sheet for Patients: BloggerCourse.com  Fact Sheet for Healthcare Providers: SeriousBroker.it  This test is not yet approved or cleared by the [place] FDA and has been authorized for detection and/or diagnosis of SARS-CoV-2 by FDA under an Emergency Use Authorization (EUA). This EUA will remain in effect (meaning this test can be used) for the duration of the COVID-19 declaration under Section 564(b)(1) of the Act, 21 U.S.C. section 360bbb-3(b)(1), unless the authorization is terminated or revoked.  Performed at [facility], [address] [place], [place] [address]       Labs: BNP (last 3 results) No results for input(s): BNP in the last 8760 hours. Basic Metabolic Panel: Recent Labs  Lab 12/04/20 1912  NA 137  K 4.1  CL 105  CO2 26  GLUCOSE 100*  BUN 17  CREATININE 0.75  CALCIUM 8.6*   Liver Function Tests: Recent Labs  Lab 12/04/20 1912  AST 25  ALT 26  ALKPHOS 83  BILITOT 0.7  PROT 7.0  ALBUMIN 3.7   No results for input(s): LIPASE, AMYLASE in the last 168 hours. No results for input(s): AMMONIA in the last 168 hours. CBC: Recent Labs  Lab 12/04/20 1912  WBC 4.9  NEUTROABS 2.5  HGB 13.5  HCT 38.6*  MCV 89.1  PLT 97*   Cardiac Enzymes: No results for input(s): CKTOTAL, CKMB, CKMBINDEX, TROPONINI in the last 168 hours. BNP: Invalid input(s): POCBNP CBG: Recent Labs  Lab 12/04/20 1829  GLUCAP 97   D-Dimer No results for input(s): DDIMER in the last 72 hours. Hgb A1c Recent Labs    12/05/20 0723  HGBA1C 5.4   Lipid Profile Recent Labs    12/05/20 0723  CHOL 196  HDL 38*  LDLCALC 123*  TRIG 175*  CHOLHDL 5.2   Thyroid function studies No results for input(s): TSH, T4TOTAL, T3FREE, THYROIDAB in the last 72 hours.  Invalid input(s): FREET3 Anemia work up No results for input(s): VITAMINB12, FOLATE, FERRITIN, TIBC, IRON, RETICCTPCT in the last 72 hours. Urinalysis    Component Value Date/Time   COLORURINE YELLOW (A) 03/19/2020 1817   APPEARANCEUR HAZY (A) 03/19/2020 1817   LABSPEC 1.024 03/19/2020 1817   PHURINE 5.0 03/19/2020 1817   GLUCOSEU NEGATIVE 03/19/2020 1817   HGBUR NEGATIVE 03/19/2020 1817   BILIRUBINUR NEGATIVE 03/19/2020 1817   KETONESUR NEGATIVE 03/19/2020 1817   PROTEINUR NEGATIVE 03/19/2020 1817   NITRITE NEGATIVE 03/19/2020 1817   LEUKOCYTESUR NEGATIVE 03/19/2020 1817   Sepsis Labs Invalid input(s): PROCALCITONIN,  WBC,  LACTICIDVEN Microbiology Recent Results (from the past 240 hour(s))  Resp Panel by RT-PCR (Flu A&B, Covid) Nasopharyngeal Swab     Status: None   Collection  Time: 12/04/20  8:30 PM   Specimen: Nasopharyngeal Swab; Nasopharyngeal(NP) swabs in vial transport medium  Result Value Ref Range Status   SARS Coronavirus 2 by RT PCR NEGATIVE NEGATIVE Final    Comment: (NOTE) SARS-CoV-2 target nucleic acids are NOT DETECTED.  The SARS-CoV-2 RNA is generally detectable in upper respiratory specimens during the acute phase of infection. The lowest concentration of SARS-CoV-2 viral copies this assay can detect is 138 copies/mL. A negative result does not preclude SARS-Cov-2 infection and should not be used as the sole basis for treatment or other patient management decisions. A negative result may occur with  improper specimen collection/handling, submission of specimen other than  nasopharyngeal swab, presence of viral mutation(s) within the areas targeted by this assay, and inadequate number of viral copies(<138 copies/mL). A negative result must be combined with clinical observations, patient history, and epidemiological information. The expected result is Negative.  Fact Sheet for Patients:  BloggerCourse.com  Fact Sheet for Healthcare Providers:  SeriousBroker.it  This test is no t yet approved or cleared by the [place] FDA and  has been authorized for detection and/or diagnosis of SARS-CoV-2 by FDA under an Emergency Use Authorization (EUA). This EUA will remain  in effect (meaning this test can be used) for the duration of the COVID-19 declaration under Section 564(b)(1) of the Act, 21 U.S.C.section 360bbb-3(b)(1), unless the authorization is terminated  or revoked sooner.       Influenza A by PCR NEGATIVE NEGATIVE Final   Influenza B by PCR NEGATIVE NEGATIVE Final    Comment: (NOTE) The Xpert Xpress SARS-CoV-2/FLU/RSV plus assay is intended as an aid in the diagnosis of influenza from Nasopharyngeal swab specimens and should not be used as a sole basis for treatment. Nasal washings  and aspirates are unacceptable for Xpert Xpress SARS-CoV-2/FLU/RSV testing.  Fact Sheet for Patients: BloggerCourse.com  Fact Sheet for Healthcare Providers: SeriousBroker.it  This test is not yet approved or cleared by the [place] FDA and has been authorized for detection and/or diagnosis of SARS-CoV-2 by FDA under an Emergency Use Authorization (EUA). This EUA will remain in effect (meaning this test can be used) for the duration of the COVID-19 declaration under Section 564(b)(1) of the Act, 21 U.S.C. section 360bbb-3(b)(1), unless the authorization is terminated or revoked.  Performed at [facility], [address], [place], [place] [address]      Time coordinating discharge: Over 30 minutes  SIGNED:   Cipriano Bunker, MD  [facility] 12/06/2020, 5:26 PM Pager   If 7PM-7AM, please contact night-coverage

## 2020-12-06 NOTE — Progress Notes (Signed)
Subjective: Has had some tearing from his left eye. Left ear pain is improving on prednisone and valacyclovir.   Objective: Current vital signs: BP 136/71   Pulse 79   Temp 98.7 F (37.1 C) (Oral)   Resp 17   Ht 6' (1.829 m)   Wt (!) 195 kg   SpO2 97%   BMI 58.32 kg/m  Vital signs in last 24 hours: Temp:  [98.7 F (37.1 C)] 98.7 F (37.1 C) (11/02 0734) Pulse Rate:  [72-85] 79 (11/02 1145) Resp:  [13-21] 17 (11/02 1145) BP: (135-155)/(59-94) 136/71 (11/02 1145) SpO2:  [92 %-100 %] 97 % (11/02 1145)  Intake/Output from previous day: No intake/output data recorded. Intake/Output this shift: No intake/output data recorded. Nutritional status:  Diet Order             Diet Heart Room service appropriate? Yes; Fluid consistency: Thin  Diet effective now                  HEENT: [place]/AT.  Lungs: Respirations unlabored Ext: Warm and well perfused   Neurologic Exam: Ment: Intact to complex questions and commands. No aphasia. Fully oriented. Speech non-dysarthric.  CN: PERRL, Visual fields intact. EOMI. Decreased contractions of muscles to LLQ of face as well as brow, in addition to weakness of left eyelid closure; these findings are slightly improved relative to yesterday's exam. Phonation intact.   Motor: 5/5 BUE and BLE.  Sensory: Intact to FT x 4 Cerebellar: No ataxia with FNF bilaterally   Lab Results: Results for orders placed or performed during the hospital encounter of 12/04/20 (from the past 48 hour(s))  CBG monitoring, ED     Status: None   Collection Time: 12/04/20  6:29 PM  Result Value Ref Range   Glucose-Capillary 97 70 - 99 mg/dL    Comment: Glucose reference range applies only to samples taken after fasting for at least 8 hours.  Protime-INR     Status: None   Collection Time: 12/04/20  7:12 PM  Result Value Ref Range   Prothrombin Time 13.8 11.4 - 15.2 seconds   INR 1.1 0.8 - 1.2    Comment: (NOTE) INR goal varies based on device and disease  states. Performed at [facility], [address] [place], [place] [address]   APTT     Status: None   Collection Time: 12/04/20  7:12 PM  Result Value Ref Range   aPTT 26 24 - 36 seconds    Comment: Performed at [facility], [address] [place], [place] [address]  CBC     Status: Abnormal   Collection Time: 12/04/20  7:12 PM  Result Value Ref Range   WBC 4.9 4.0 - 10.5 K/uL   RBC 4.33 4.22 - 5.81 MIL/uL   Hemoglobin 13.5 13.0 - 17.0 g/dL   HCT 38.6 (L) 39.0 - 52.0 %   MCV 89.1 80.0 - 100.0 fL   MCH 31.2 26.0 - 34.0 pg   MCHC 35.0 30.0 - 36.0 g/dL   RDW 13.3 11.5 - 15.5 %   Platelets 97 (L) 150 - 400 K/uL    Comment: Immature Platelet Fraction may be clinically indicated, consider ordering this additional test [id number]    nRBC 0.0 0.0 - 0.2 %    Comment: Performed at [facility], [address] [place], [place] [address]  Differential     Status: None   Collection Time: 12/04/20  7:12 PM  Result Value Ref Range   Neutrophils  Relative % 51 %   Neutro Abs 2.5 1.7 - 7.7 K/uL   Lymphocytes Relative 41 %   Lymphs Abs 2.0 0.7 - 4.0 K/uL   Monocytes Relative 6 %   Monocytes Absolute 0.3 0.1 - 1.0 K/uL   Eosinophils Relative 1 %   Eosinophils Absolute 0.1 0.0 - 0.5 K/uL   Basophils Relative 1 %   Basophils Absolute 0.0 0.0 - 0.1 K/uL   Immature Granulocytes 0 %   Abs Immature Granulocytes 0.02 0.00 - 0.07 K/uL    Comment: Performed at [facility], [address] [place], [place] [address]  Comprehensive metabolic panel     Status: Abnormal   Collection Time: 12/04/20  7:12 PM  Result Value Ref Range   Sodium 137 135 - 145 mmol/L   Potassium 4.1 3.5 - 5.1 mmol/L   Chloride 105 98 - 111 mmol/L   CO2 26 22 - 32 mmol/L   Glucose, Bld 100 (H) 70 - 99 mg/dL    Comment: Glucose reference range applies only to samples taken after fasting for at least 8 hours.   BUN 17 6 - 20 mg/dL   Creatinine, Ser 0.75 0.61 - 1.24 mg/dL    Calcium 8.6 (L) 8.9 - 10.3 mg/dL   Total Protein 7.0 6.5 - 8.1 g/dL   Albumin 3.7 3.5 - 5.0 g/dL   AST 25 15 - 41 U/L   ALT 26 0 - 44 U/L   Alkaline Phosphatase 83 38 - 126 U/L   Total Bilirubin 0.7 0.3 - 1.2 mg/dL   GFR, Estimated >60 >60 mL/min    Comment: (NOTE) Calculated using the CKD-EPI Creatinine Equation (2021)    Anion gap 6 5 - 15    Comment: Performed at [facility], [address] [place], [place] [address]  Resp Panel by RT-PCR (Flu A&B, Covid) Nasopharyngeal Swab     Status: None   Collection Time: 12/04/20  8:30 PM   Specimen: Nasopharyngeal Swab; Nasopharyngeal(NP) swabs in vial transport medium  Result Value Ref Range   SARS Coronavirus 2 by RT PCR NEGATIVE NEGATIVE    Comment: (NOTE) SARS-CoV-2 target nucleic acids are NOT DETECTED.  The SARS-CoV-2 RNA is generally detectable in upper respiratory specimens during the acute phase of infection. The lowest concentration of SARS-CoV-2 viral copies this assay can detect is 138 copies/mL. A negative result does not preclude SARS-Cov-2 infection and should not be used as the sole basis for treatment or other patient management decisions. A negative result may occur with  improper specimen collection/handling, submission of specimen other than nasopharyngeal swab, presence of viral mutation(s) within the areas targeted by this assay, and inadequate number of viral copies(<138 copies/mL). A negative result must be combined with clinical observations, patient history, and epidemiological information. The expected result is Negative.  Fact Sheet for Patients:  EntrepreneurPulse.com.au  Fact Sheet for Healthcare Providers:  IncredibleEmployment.be  This test is no t yet approved or cleared by the [place] FDA and  has been authorized for detection and/or diagnosis of SARS-CoV-2 by FDA under an Emergency Use Authorization (EUA). This EUA will remain  in effect  (meaning this test can be used) for the duration of the COVID-19 declaration under Section 564(b)(1) of the Act, 21 U.S.C.section 360bbb-3(b)(1), unless the authorization is terminated  or revoked sooner.       Influenza A by PCR NEGATIVE NEGATIVE   Influenza B by PCR NEGATIVE NEGATIVE    Comment: (NOTE) The Xpert Xpress SARS-CoV-2/FLU/RSV plus  assay is intended as an aid in the diagnosis of influenza from Nasopharyngeal swab specimens and should not be used as a sole basis for treatment. Nasal washings and aspirates are unacceptable for Xpert Xpress SARS-CoV-2/FLU/RSV testing.  Fact Sheet for Patients: EntrepreneurPulse.com.au  Fact Sheet for Healthcare Providers: IncredibleEmployment.be  This test is not yet approved or cleared by the [place] FDA and has been authorized for detection and/or diagnosis of SARS-CoV-2 by FDA under an Emergency Use Authorization (EUA). This EUA will remain in effect (meaning this test can be used) for the duration of the COVID-19 declaration under Section 564(b)(1) of the Act, 21 U.S.C. section 360bbb-3(b)(1), unless the authorization is terminated or revoked.  Performed at [facility], [address] [place], [place] [address]   Hemoglobin A1c     Status: None   Collection Time: 12/05/20  7:23 AM  Result Value Ref Range   Hgb A1c MFr Bld 5.4 4.8 - 5.6 %    Comment: (NOTE) Pre diabetes:          5.7%-6.4%  Diabetes:              >6.4%  Glycemic control for   <7.0% adults with diabetes    Mean Plasma Glucose 108.28 mg/dL    Comment: Performed at [facility] [address] [place], [place] [address]  Lipid panel     Status: Abnormal   Collection Time: 12/05/20  7:23 AM  Result Value Ref Range   Cholesterol 196 0 - 200 mg/dL   Triglycerides 175 (H) <150 mg/dL   HDL 38 (L) >40 mg/dL   Total CHOL/HDL Ratio 5.2 RATIO   VLDL 35 0 - 40 mg/dL   LDL Cholesterol 123 (H) 0 - 99  mg/dL    Comment:        Total Cholesterol/HDL:CHD Risk Coronary Heart Disease Risk Table                     Men   Women  1/2 Average Risk   3.4   3.3  Average Risk       5.0   4.4  2 X Average Risk   9.6   7.1  3 X Average Risk  23.4   11.0        Use the calculated Patient Ratio above and the CHD Risk Table to determine the patient's CHD Risk.        ATP III CLASSIFICATION (LDL):  <100     mg/dL   Optimal  100-129  mg/dL   Near or Above                    Optimal  130-159  mg/dL   Borderline  160-189  mg/dL   High  >190     mg/dL   Very High Performed at [facility], [place], [place] [address]     Recent Results (from the past 240 hour(s))  Resp Panel by RT-PCR (Flu A&B, Covid) Nasopharyngeal Swab     Status: None   Collection Time: 12/04/20  8:30 PM   Specimen: Nasopharyngeal Swab; Nasopharyngeal(NP) swabs in vial transport medium  Result Value Ref Range Status   SARS Coronavirus 2 by RT PCR NEGATIVE NEGATIVE Final    Comment: (NOTE) SARS-CoV-2 target nucleic acids are NOT DETECTED.  The SARS-CoV-2 RNA is generally detectable in upper respiratory specimens during the acute phase of infection. The lowest concentration of SARS-CoV-2 viral copies  this assay can detect is 138 copies/mL. A negative result does not preclude SARS-Cov-2 infection and should not be used as the sole basis for treatment or other patient management decisions. A negative result may occur with  improper specimen collection/handling, submission of specimen other than nasopharyngeal swab, presence of viral mutation(s) within the areas targeted by this assay, and inadequate number of viral copies(<138 copies/mL). A negative result must be combined with clinical observations, patient history, and epidemiological information. The expected result is Negative.  Fact Sheet for Patients:  EntrepreneurPulse.com.au  Fact Sheet for Healthcare Providers:   IncredibleEmployment.be  This test is no t yet approved or cleared by the [place] FDA and  has been authorized for detection and/or diagnosis of SARS-CoV-2 by FDA under an Emergency Use Authorization (EUA). This EUA will remain  in effect (meaning this test can be used) for the duration of the COVID-19 declaration under Section 564(b)(1) of the Act, 21 U.S.C.section 360bbb-3(b)(1), unless the authorization is terminated  or revoked sooner.       Influenza A by PCR NEGATIVE NEGATIVE Final   Influenza B by PCR NEGATIVE NEGATIVE Final    Comment: (NOTE) The Xpert Xpress SARS-CoV-2/FLU/RSV plus assay is intended as an aid in the diagnosis of influenza from Nasopharyngeal swab specimens and should not be used as a sole basis for treatment. Nasal washings and aspirates are unacceptable for Xpert Xpress SARS-CoV-2/FLU/RSV testing.  Fact Sheet for Patients: EntrepreneurPulse.com.au  Fact Sheet for Healthcare Providers: IncredibleEmployment.be  This test is not yet approved or cleared by the [place] FDA and has been authorized for detection and/or diagnosis of SARS-CoV-2 by FDA under an Emergency Use Authorization (EUA). This EUA will remain in effect (meaning this test can be used) for the duration of the COVID-19 declaration under Section 564(b)(1) of the Act, 21 U.S.C. section 360bbb-3(b)(1), unless the authorization is terminated or revoked.  Performed at [facility], [address] [place], [place] [address]     Lipid Panel Recent Labs    12/05/20 0723  CHOL 196  TRIG 175*  HDL 38*  CHOLHDL 5.2  VLDL 35  LDLCALC 123*    Studies/Results: MR BRAIN WO CONTRAST  Result Date: 12/04/2020 CLINICAL DATA:  Left facial droop and left facial and arm numbness. EXAM: MRI HEAD WITHOUT CONTRAST TECHNIQUE: Multiplanar, multiecho pulse sequences of the brain and surrounding structures were obtained  without intravenous contrast. COMPARISON:  None. FINDINGS: Brain: No acute infarct, mass effect or extra-axial collection. No acute or chronic hemorrhage. There is multifocal hyperintense T2-weighted signal within the white matter. Parenchymal volume and CSF spaces are normal. The midline structures are normal. Vascular: Major flow voids are preserved. Skull and upper cervical spine: Normal calvarium and skull base. Visualized upper cervical spine and soft tissues are normal. Sinuses/Orbits:No paranasal sinus fluid levels or advanced mucosal thickening. No mastoid or middle ear effusion. Normal orbits. IMPRESSION: 1. No acute intracranial abnormality. 2. Multifocal hyperintense T2-weighted signal within the white matter, most commonly seen in the setting of chronic microvascular ischemia. Electronically Signed   By: Ulyses Jarred M.D.   On: 12/04/2020 21:37   MR BRAIN W CONTRAST  Result Date: 12/06/2020 CLINICAL DATA:  Cranial neuropathy. EXAM: MRI HEAD WITH CONTRAST TECHNIQUE: Multiplanar, multiecho pulse sequences of the brain and surrounding structures were obtained with intravenous contrast. CONTRAST:  41mL GADAVIST GADOBUTROL 1 MMOL/ML IV SOLN COMPARISON:  Head CT December 04, 2020. FINDINGS: Brain: Dedicated images with IAC protocol show enhancement of the left seventh cranial  nerve at the left IAC fundus and labyrinthine segment, consistent with neuritis (series 10, image 44; series 12, image 17). No cerebellopontine angle or internal auditory canal mass lesion is demonstrated.Normal appearance of the 7th and 8th cranial nerves on the right. Vascular: Normal contrast opacification the major vessels. Skull and upper cervical spine: Negative. Sinuses/Orbits: Negative. IMPRESSION: Contrast enhancement of the left that cranial nerve at the left IAC fundus and labyrinthine segment, consistent with neuritis. Electronically Signed   By: Pedro Earls M.D.   On: 12/06/2020 09:58   DG Knee  Complete 4 Views Right  Result Date: 12/04/2020 CLINICAL DATA:  Right knee pain EXAM: RIGHT KNEE - COMPLETE 4+ VIEW COMPARISON:  04/17/2020 FINDINGS: Degenerative changes throughout the right knee with joint space narrowing and spurring, most pronounced in the medial and patellofemoral compartments. No joint effusion. No acute bony abnormality. Specifically, no fracture, subluxation, or dislocation. IMPRESSION: Moderate degenerative changes.  No acute bony abnormality. Electronically Signed   By: Rolm Baptise M.D.   On: 12/04/2020 19:42   EEG adult  Result Date: 12/05/2020 Lora Havens, MD     12/05/2020  3:50 PM Patient Name: Randy Cordova. MRN: [id number] Epilepsy Attending: Lora Havens Referring Physician/Provider: Dr Kerney Elbe Date: 12/05/2020 Duration: 28.24 mins Patient history: 50 year old male with history of epilepsy presented with acute onset left-sided weakness following a seizure at home.  EEG to evaluate for seizures. Level of alertness: Awake, asleep AEDs during EEG study: OXC, TPM, LEV, Onfi Technical aspects: This EEG study was done with scalp electrodes positioned according to the 10-20 International system of electrode placement. Electrical activity was acquired at a sampling rate of $Remov'500Hz'wJwJai$  and reviewed with a high frequency filter of $RemoveB'70Hz'nPpbCxwg$  and a low frequency filter of $RemoveB'1Hz'GQqtqgOW$ . EEG data were recorded continuously and digitally stored. Description: The posterior dominant rhythm consists of 9 Hz activity of moderate voltage (25-35 uV) seen predominantly in posterior head regions, symmetric and reactive to eye opening and eye closing. Sleep was characterized by vertex waves, sleep spindles (12 to 14 Hz), maximal frontocentral region. There is an excessive amount of 15 to 18 Hz beta activity  distributed symmetrically and diffusely. Physiologic photic driving was not seen during photic stimulation. Hyperventilation was not performed.    ABNORMALITY - Excessive beta, generalized   IMPRESSION: This study is within normal limits. No seizures or epileptiform discharges were seen throughout the recording. The excessive beta activity seen in the background is most likely due to the effect of benzodiazepine and is a benign EEG pattern.   Lora Havens   ECHOCARDIOGRAM COMPLETE  Result Date: 12/05/2020    ECHOCARDIOGRAM REPORT   Patient Name:   Marlen Mollica. Date of Exam: 12/05/2020 Medical Rec #:  [id number]          Height:       72.0 in Accession #:    [id number]         Weight:       430.0 lb Date of Birth:  [birth year]           BSA:          2.950 m Patient Age:    50 years           BP:           123/69 mmHg Patient Gender: M                  HR:  77 bpm. Exam Location:  [facility] Procedure: 2D Echo, Cardiac Doppler and Color Doppler Indications:     Stroke I63.9  History:         Patient has no prior history of Echocardiogram examinations.                  OSA.  Sonographer:     Sherrie Sport Referring Phys:  [id number] Athena Masse Diagnosing Phys: Nelva Bush MD  Sonographer Comments: No apical window and no subcostal window. IMPRESSIONS  1. Left ventricular ejection fraction, by estimation, is 45 to 50%. The left ventricle has mildly decreased function. Left ventricular endocardial border not optimally defined to evaluate regional wall motion. There is mild left ventricular hypertrophy.  Left ventricular diastolic function could not be evaluated.  2. Right ventricular systolic function was not well visualized. The right ventricular size is not well visualized. Tricuspid regurgitation signal is inadequate for assessing PA pressure.  3. The mitral valve is abnormal. Unable to accurately assess mitral valve regurgitation.  4. The aortic valve has an indeterminant number of cusps. Aortic valve regurgitation not well assessed. FINDINGS  Left Ventricle: Left ventricular ejection fraction, by estimation, is 45 to 50%. The left ventricle has mildly decreased function. Left ventricular  endocardial border not optimally defined to evaluate regional wall motion. The left ventricular internal cavity size was normal in size. There is mild left ventricular hypertrophy. Left ventricular diastolic function could not be evaluated. Right Ventricle: The right ventricular size is not well visualized. No increase in right ventricular wall thickness. Right ventricular systolic function was not well visualized. Tricuspid regurgitation signal is inadequate for assessing PA pressure. Left Atrium: Left atrial size was not well visualized. Right Atrium: Right atrial size was not well visualized. Pericardium: The pericardium was not well visualized. Mitral Valve: The mitral valve is abnormal. There is moderate thickening of the mitral valve leaflet(s). Unable to accurately assess mitral valve regurgitation. Tricuspid Valve: The tricuspid valve is not well visualized. Tricuspid valve regurgitation is not demonstrated. Aortic Valve: The aortic valve has an indeterminant number of cusps. Aortic valve regurgitation not well assessed. Pulmonic Valve: The pulmonic valve was not well visualized. Pulmonic valve regurgitation is not visualized. No evidence of pulmonic stenosis. Aorta: The aortic root is normal in size and structure. Pulmonary Artery: The pulmonary artery is not well seen. IAS/Shunts: The interatrial septum was not well visualized.  LEFT VENTRICLE PLAX 2D LVIDd:         5.12 cm LVIDs:         3.90 cm LV PW:         1.22 cm LV IVS:        1.27 cm LVOT diam:     2.30 cm LVOT Area:     4.15 cm  LEFT ATRIUM         Index LA diam:    4.10 cm 1.39 cm/m                        PULMONIC VALVE AORTA                 PV Vmax:        0.75 m/s Ao Root diam: 3.43 cm PV Vmean:       49.250 cm/s                       PV VTI:         0.149 m  PV Peak grad:   2.3 mmHg                       PV Mean grad:   1.0 mmHg                       RVOT Peak grad: 4 mmHg   SHUNTS Systemic Diam: 2.30 cm Pulmonic VTI:   0.180 m Nelva Bush MD Electronically signed by Nelva Bush MD Signature Date/Time: 12/05/2020/3:38:32 PM    Final    CT HEAD CODE STROKE WO CONTRAST  Result Date: 12/04/2020 CLINICAL DATA:  Code stroke.  Acute neurologic deficit EXAM: CT HEAD WITHOUT CONTRAST TECHNIQUE: Contiguous axial images were obtained from the base of the skull through the vertex without intravenous contrast. COMPARISON:  None. FINDINGS: Brain: There is no mass, hemorrhage or extra-axial collection. The size and configuration of the ventricles and extra-axial CSF spaces are normal. The brain parenchyma is normal, without evidence of acute or chronic infarction. Vascular: No abnormal hyperdensity of the major intracranial arteries or dural venous sinuses. No intracranial atherosclerosis. Skull: The visualized skull base, calvarium and extracranial soft tissues are normal. Sinuses/Orbits: No fluid levels or advanced mucosal thickening of the visualized paranasal sinuses. No mastoid or middle ear effusion. The orbits are normal. ASPECTS [facility] Stroke Program Early CT Score) - Ganglionic level infarction (caudate, lentiform nuclei, internal capsule, insula, M1-M3 cortex): 7 - Supraganglionic infarction (M4-M6 cortex): 3 Total score (0-10 with 10 being normal): 10 IMPRESSION: 1. Normal head CT. 2. ASPECTS is 10. These results were called by telephone at the time of interpretation on 12/04/2020 at 6:46 pm to provider [facility] , who verbally acknowledged these results. Electronically Signed   By: Ulyses Jarred M.D.   On: 12/04/2020 18:46   CT ANGIO HEAD NECK W WO CM (CODE STROKE)  Result Date: 12/04/2020 CLINICAL DATA:  Left-sided weakness EXAM: CT ANGIOGRAPHY HEAD AND NECK TECHNIQUE: Multidetector CT imaging of the head and neck was performed using the standard protocol during bolus administration of intravenous contrast. Multiplanar CT image reconstructions and MIPs were obtained to evaluate the vascular anatomy. Carotid  stenosis measurements (when applicable) are obtained utilizing NASCET criteria, using the distal internal carotid diameter as the denominator. CONTRAST:  9mL OMNIPAQUE IOHEXOL 350 MG/ML SOLN COMPARISON:  None. 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 OTHER NECK: Normal pharynx, larynx and major salivary glands. No cervical lymphadenopathy. Unremarkable thyroid gland. UPPER CHEST: No pneumothorax or pleural effusion. No nodules or masses. AORTIC ARCH: There is no calcific atherosclerosis of the aortic arch. There is no aneurysm, dissection or hemodynamically significant stenosis of the visualized portion of the aorta. Conventional 3 vessel aortic branching pattern. The visualized proximal subclavian arteries are widely patent. RIGHT CAROTID SYSTEM: Normal without aneurysm, dissection or stenosis. LEFT CAROTID SYSTEM: Normal without aneurysm, dissection or stenosis. VERTEBRAL ARTERIES: Left dominant configuration. Both origins are clearly patent. There is no dissection, occlusion or flow-limiting stenosis to the skull base (V1-V3 segments). CTA HEAD FINDINGS POSTERIOR CIRCULATION: --Vertebral arteries: Normal V4 segments. --Inferior cerebellar arteries: Normal. --Basilar artery: Normal. --Superior cerebellar arteries: Normal. --Posterior cerebral arteries (PCA): Normal. ANTERIOR CIRCULATION: --Intracranial internal carotid arteries: Normal. --Anterior cerebral arteries (ACA): Normal. Both A1 segments are present. Patent anterior communicating artery (a-comm). --Middle cerebral arteries (MCA): Normal. VENOUS SINUSES: As permitted by contrast timing, patent. ANATOMIC VARIANTS: None Review of the MIP images confirms  the above findings. IMPRESSION: 1. No emergent large vessel occlusion or high-grade stenosis of the intracranial or cervical arteries. 2. 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 Electronically Signed   By: Ulyses Jarred M.D.   On: 12/04/2020 20:11    Medications: Scheduled:  acetaminophen  1,000 mg Oral Once   chlorhexidine gluconate (MEDLINE KIT)  15 mL Mouth Rinse BID   cloBAZam  20 mg Oral QHS   enoxaparin (LOVENOX) injection  0.5 mg/kg Subcutaneous Q24H   levETIRAcetam  1,500 mg Oral BID   mouth rinse  15 mL Mouth Rinse 10 times per day   oxcarbazepine  1,200 mg Oral BID   predniSONE  60 mg Oral Q breakfast   Followed by   Derrill Memo ON 12/11/2020] predniSONE  50 mg Oral Q breakfast   Followed by   Derrill Memo ON 12/12/2020] predniSONE  40 mg Oral Q breakfast   Followed by   Derrill Memo ON 12/13/2020] predniSONE  30 mg Oral Q breakfast   Followed by   Derrill Memo ON 12/14/2020] predniSONE  20 mg Oral Q breakfast   Followed by   Derrill Memo ON 12/15/2020] predniSONE  10 mg Oral Q breakfast   valACYclovir  1,000 mg Oral TID   Continuous:  sodium chloride 75 mL/hr at 12/04/20 2229   sodium chloride 75 mL/hr (12/04/20 2219)   Assessment/Recommendations: 50 year old male with a history of epilepsy presenting with acute onset of left sided weakness following a seizure at home.    Increased seizure frequency at home: - History of epilepsy.  - Takes Onfi, Keppra and Oxcarbazepine at home - DDx for increased seizure frequency includes increased metabolism of meds versus poor absorption versus and intrinsic change in seizure frequency - Being followed at an outside facility for this diagnosis. Per patient, he recently underwent functional MRI and is being worked up for possible epilepsy surgery.  - EEG revealed excessive generalized beta activity. Other than this finding, the study is within normal limits. No seizures or epileptiform discharges were seen throughout the recording. The excessive beta activity seen in the background is most likely due to the effect of benzodiazepine and is a benign EEG pattern.              Recommendations:             - Keppra level is pending             - Seizure precautions                - Continue home Onfi, Keppra and oxcarbazepine    Left sided Bell's palsy:  - Pattern of left facial weakness is now most consistent with a Bell's palsy, which has sllightly improved since yesterday on initial doses of prednisone and valacyclovir. Left ear pain has also improved. No EAC vesicles, crusting or drainage.   - MRI brain: No acute intracranial abnormality. Multifocal hyperintense T2-weighted signal within the white matter, most commonly seen in the setting of chronic microvascular ischemia.  - Follow up MRI with contrast reveals enhancement of the left seventh cranial nerve at the left IAC fundus and labyrinthine segment, consistent with neuritis. No cerebellopontine angle or internal auditory canal mass lesion is demonstrated. Normal appearance of the 7th and 8th cranial nerves on the right.             Recommendations: - Continue prednisone and valacyclovir for Bell's palsy, which is secondary to CN7 neuritis based  on MRI results. Prednisone at 60 mg po qd x 6 days, then taper off by 10 mg per day over the last 5 days. Continue empiric valacyclovir at 1000 mg po TID x 7 days.  - Outpatient follow ups with PCP and Neurology                  LOS: 2 days   '@Electronically'$  signed: Dr. Kerney Elbe 12/06/2020  1:17 PM

## 2020-12-23 DIAGNOSIS — E559 Vitamin D deficiency, unspecified: Secondary | ICD-10-CM | POA: Insufficient documentation

## 2020-12-23 DIAGNOSIS — I10 Essential (primary) hypertension: Secondary | ICD-10-CM

## 2020-12-23 DIAGNOSIS — E785 Hyperlipidemia, unspecified: Secondary | ICD-10-CM

## 2020-12-23 DIAGNOSIS — E119 Type 2 diabetes mellitus without complications: Secondary | ICD-10-CM | POA: Insufficient documentation

## 2020-12-23 DIAGNOSIS — Z720 Tobacco use: Secondary | ICD-10-CM | POA: Insufficient documentation

## 2020-12-23 DIAGNOSIS — F411 Generalized anxiety disorder: Secondary | ICD-10-CM

## 2020-12-23 DIAGNOSIS — K7460 Unspecified cirrhosis of liver: Secondary | ICD-10-CM | POA: Insufficient documentation

## 2020-12-23 DIAGNOSIS — M170 Bilateral primary osteoarthritis of knee: Secondary | ICD-10-CM | POA: Insufficient documentation

## 2020-12-23 DIAGNOSIS — K760 Fatty (change of) liver, not elsewhere classified: Secondary | ICD-10-CM | POA: Insufficient documentation

## 2020-12-23 DIAGNOSIS — M25559 Pain in unspecified hip: Secondary | ICD-10-CM | POA: Insufficient documentation

## 2020-12-23 HISTORY — DX: Type 2 diabetes mellitus without complications: E11.9

## 2020-12-23 HISTORY — DX: Generalized anxiety disorder: F41.1

## 2020-12-23 HISTORY — DX: Essential (primary) hypertension: I10

## 2020-12-23 HISTORY — DX: Hyperlipidemia, unspecified: E78.5

## 2021-05-08 IMAGING — CT CT HEAD W/O CM
4 series · 16 of 47 positions shown, 18 images · non-contrast
Comparison: 03/19/2020

CLINICAL DATA: Multiple seizures, postictal

EXAM:
CT HEAD WITHOUT CONTRAST
TECHNIQUE: Contiguous axial images were obtained from the base of the skull
through the vertex without intravenous contrast.

[Series 2: head wo · axial · 0.49mm/px · z∈[-140,-10]mm · 7 of 36 slices shown, 9 images]
[im 5/36  brain]
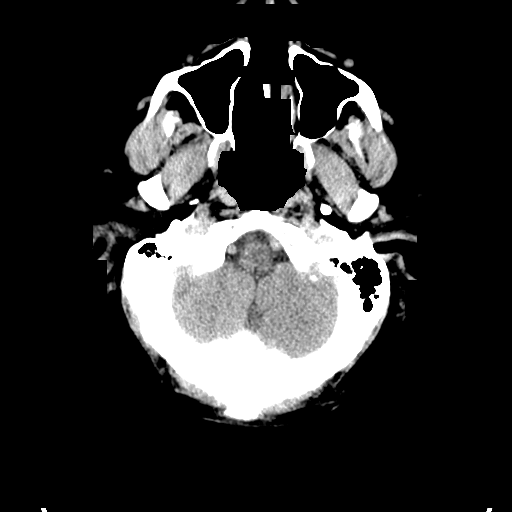
[im 5/36  bone]
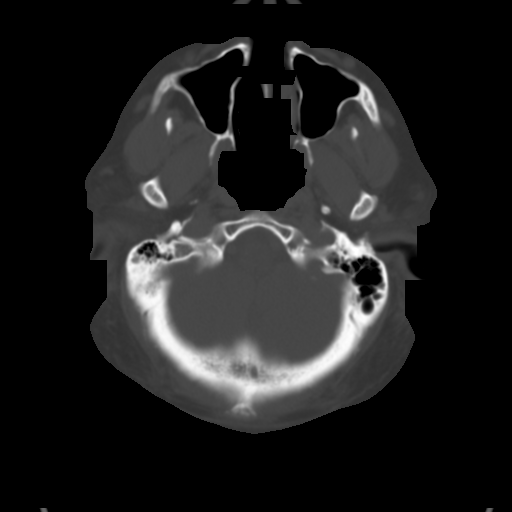
[im 9/36  brain]
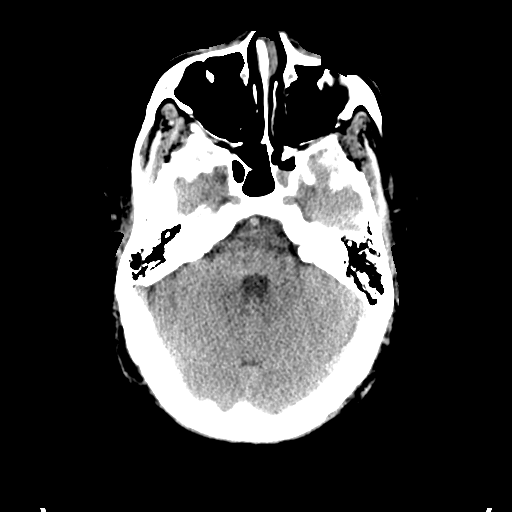
[im 14/36  brain]
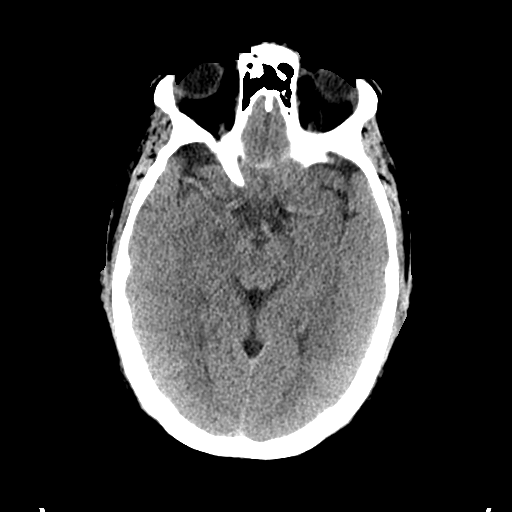
[im 18/36  brain]
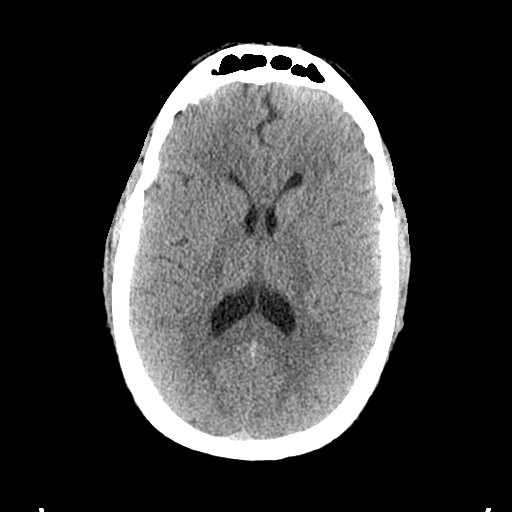
[im 22/36  brain]
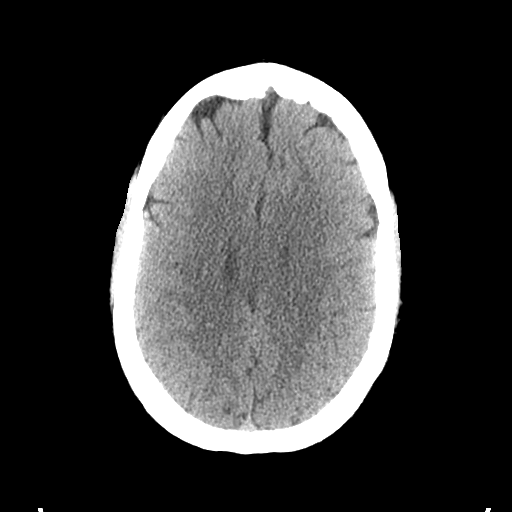
[im 22/36  bone]
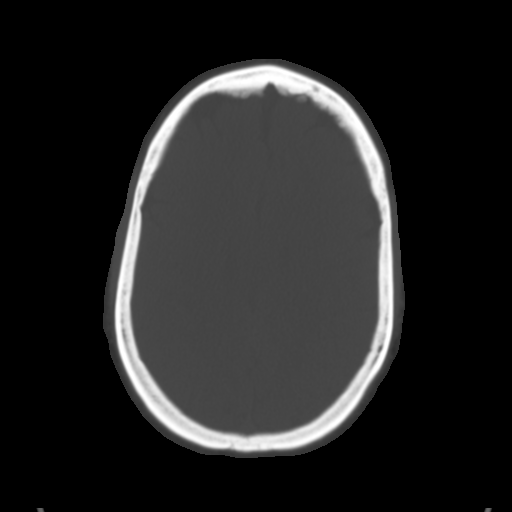
[im 27/36  brain]
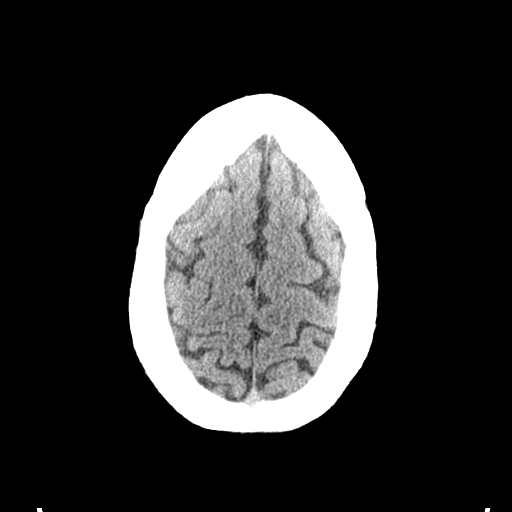
[im 31/36  brain]
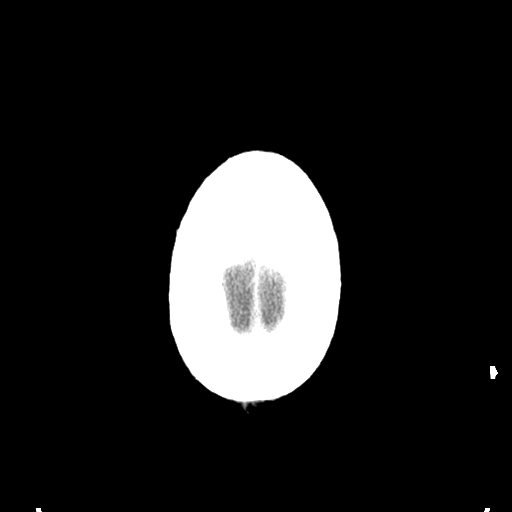

[Series 3: head bone · axial · 0.49mm/px · z∈[-144,-108]mm · 3 of 89 slices shown]
[im 9/89  bone]
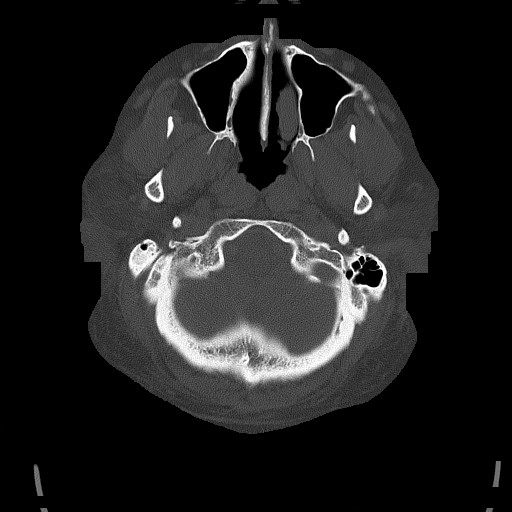
[im 18/89  bone]
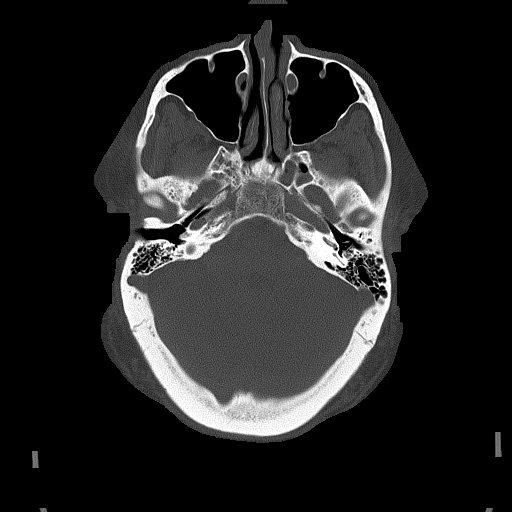
[im 27/89  bone]
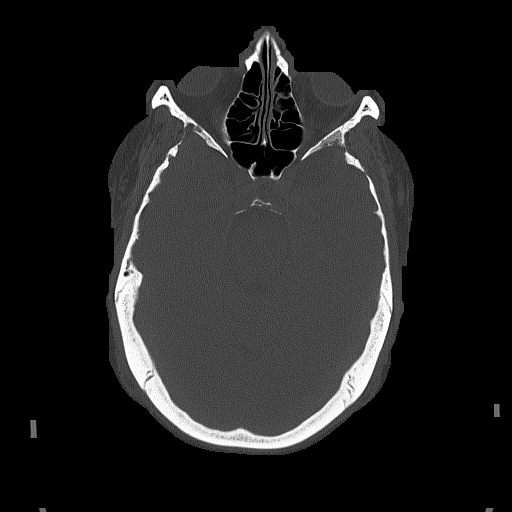

[Series 4: coronal soft tissue · coronal · 0.34mm/px · 3 of 72 slices shown]
[im 24/72  brain]
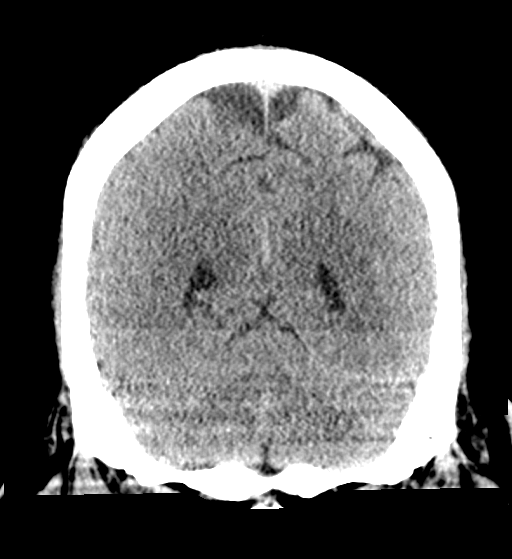
[im 32/72  brain]
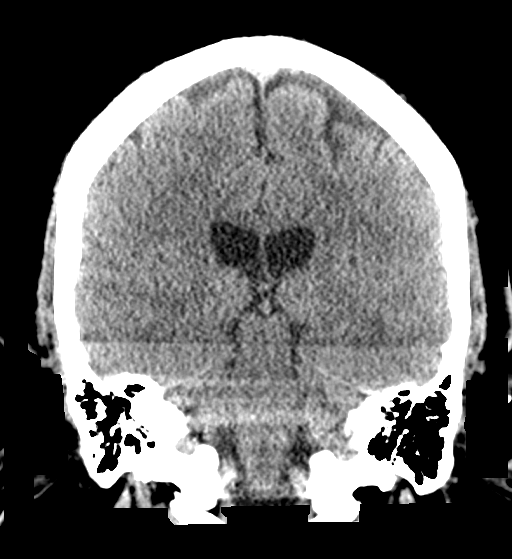
[im 40/72  brain]
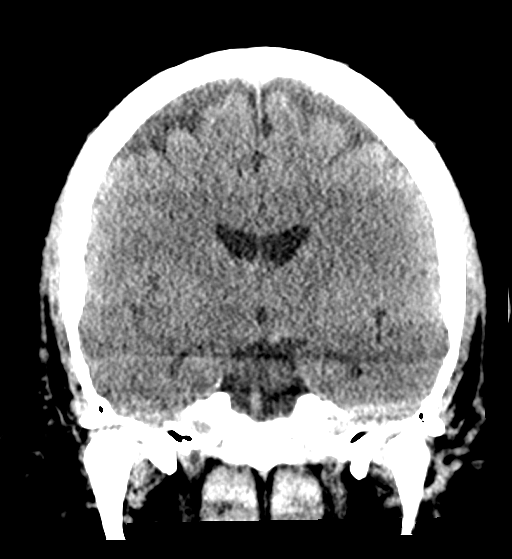

[Series 5: sagittal soft tissue · sagittal · 0.40mm/px · 3 of 57 slices shown]
[im 19/57  brain]
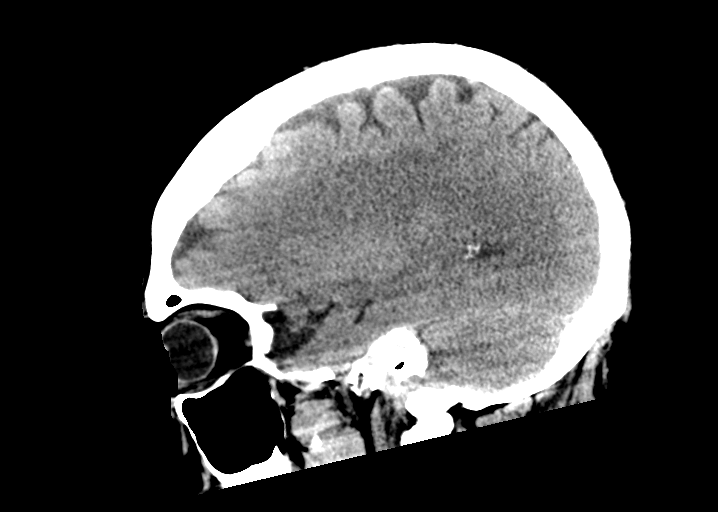
[im 29/57  brain]
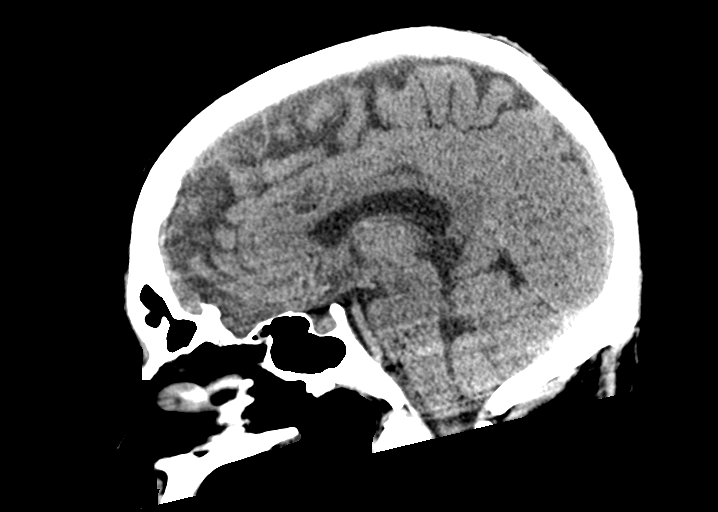
[im 38/57  brain]
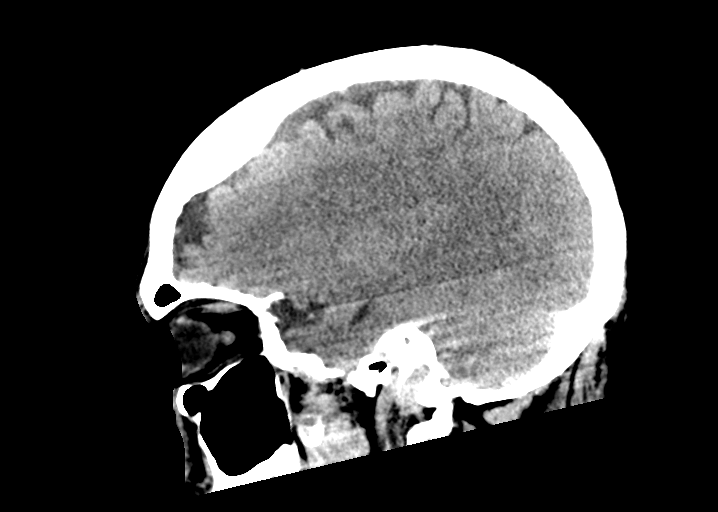

[16 of 47 positions shown; findings below may reference images not displayed]

FINDINGS: Brain: No acute infarct or hemorrhage. Lateral ventricles and
midline structures are unremarkable. No acute extra-axial fluid
collections. No mass effect.

Vascular: No hyperdense vessel or unexpected calcification.

Skull: Normal. Negative for fracture or focal lesion.

Sinuses/Orbits: Gas fluid levels within the left sphenoid sinus.
Remaining paranasal sinuses are clear.

Other: None.
IMPRESSION: 1. No acute intracranial process.
2. Chronic left sphenoid sinus disease.

## 2021-08-23 DIAGNOSIS — Z720 Tobacco use: Secondary | ICD-10-CM | POA: Diagnosis not present

## 2021-08-23 DIAGNOSIS — G4733 Obstructive sleep apnea (adult) (pediatric): Secondary | ICD-10-CM | POA: Diagnosis not present

## 2021-08-23 DIAGNOSIS — Z6841 Body Mass Index (BMI) 40.0 and over, adult: Secondary | ICD-10-CM | POA: Diagnosis not present

## 2021-08-23 DIAGNOSIS — Z01818 Encounter for other preprocedural examination: Secondary | ICD-10-CM | POA: Diagnosis not present

## 2021-08-23 DIAGNOSIS — K7030 Alcoholic cirrhosis of liver without ascites: Secondary | ICD-10-CM | POA: Diagnosis not present

## 2021-08-23 DIAGNOSIS — Z87820 Personal history of traumatic brain injury: Secondary | ICD-10-CM | POA: Diagnosis not present

## 2021-08-23 DIAGNOSIS — G40219 Localization-related (focal) (partial) symptomatic epilepsy and epileptic syndromes with complex partial seizures, intractable, without status epilepticus: Secondary | ICD-10-CM | POA: Diagnosis not present

## 2021-08-23 DIAGNOSIS — D696 Thrombocytopenia, unspecified: Secondary | ICD-10-CM | POA: Diagnosis not present

## 2021-08-23 DIAGNOSIS — G40409 Other generalized epilepsy and epileptic syndromes, not intractable, without status epilepticus: Secondary | ICD-10-CM | POA: Diagnosis not present

## 2021-08-23 DIAGNOSIS — F411 Generalized anxiety disorder: Secondary | ICD-10-CM | POA: Diagnosis not present

## 2021-08-23 DIAGNOSIS — D689 Coagulation defect, unspecified: Secondary | ICD-10-CM | POA: Diagnosis not present

## 2021-08-23 DIAGNOSIS — Z9989 Dependence on other enabling machines and devices: Secondary | ICD-10-CM | POA: Diagnosis not present

## 2021-08-30 DIAGNOSIS — G4733 Obstructive sleep apnea (adult) (pediatric): Secondary | ICD-10-CM | POA: Diagnosis not present

## 2021-08-30 DIAGNOSIS — G40419 Other generalized epilepsy and epileptic syndromes, intractable, without status epilepticus: Secondary | ICD-10-CM | POA: Diagnosis not present

## 2021-08-30 DIAGNOSIS — F411 Generalized anxiety disorder: Secondary | ICD-10-CM | POA: Diagnosis not present

## 2021-08-30 DIAGNOSIS — E039 Hypothyroidism, unspecified: Secondary | ICD-10-CM | POA: Diagnosis not present

## 2021-08-30 DIAGNOSIS — M170 Bilateral primary osteoarthritis of knee: Secondary | ICD-10-CM | POA: Diagnosis not present

## 2021-08-30 DIAGNOSIS — R7303 Prediabetes: Secondary | ICD-10-CM | POA: Diagnosis not present

## 2021-08-30 DIAGNOSIS — F419 Anxiety disorder, unspecified: Secondary | ICD-10-CM | POA: Diagnosis not present

## 2021-08-30 DIAGNOSIS — L304 Erythema intertrigo: Secondary | ICD-10-CM | POA: Diagnosis not present

## 2021-08-30 DIAGNOSIS — K7460 Unspecified cirrhosis of liver: Secondary | ICD-10-CM | POA: Diagnosis not present

## 2021-08-30 DIAGNOSIS — Z6841 Body Mass Index (BMI) 40.0 and over, adult: Secondary | ICD-10-CM | POA: Diagnosis not present

## 2021-08-30 DIAGNOSIS — Z87820 Personal history of traumatic brain injury: Secondary | ICD-10-CM | POA: Diagnosis not present

## 2021-08-30 DIAGNOSIS — D696 Thrombocytopenia, unspecified: Secondary | ICD-10-CM | POA: Diagnosis not present

## 2021-08-30 DIAGNOSIS — D649 Anemia, unspecified: Secondary | ICD-10-CM | POA: Diagnosis not present

## 2021-08-30 DIAGNOSIS — K7581 Nonalcoholic steatohepatitis (NASH): Secondary | ICD-10-CM | POA: Diagnosis not present

## 2021-08-30 DIAGNOSIS — G8929 Other chronic pain: Secondary | ICD-10-CM | POA: Diagnosis not present

## 2021-08-30 DIAGNOSIS — G40219 Localization-related (focal) (partial) symptomatic epilepsy and epileptic syndromes with complex partial seizures, intractable, without status epilepticus: Secondary | ICD-10-CM | POA: Diagnosis not present

## 2021-08-30 DIAGNOSIS — G4731 Primary central sleep apnea: Secondary | ICD-10-CM | POA: Diagnosis not present

## 2021-09-12 DIAGNOSIS — Z87891 Personal history of nicotine dependence: Secondary | ICD-10-CM | POA: Diagnosis not present

## 2021-09-12 DIAGNOSIS — G40219 Localization-related (focal) (partial) symptomatic epilepsy and epileptic syndromes with complex partial seizures, intractable, without status epilepticus: Secondary | ICD-10-CM | POA: Diagnosis not present

## 2021-09-12 DIAGNOSIS — Z4542 Encounter for adjustment and management of neuropacemaker (brain) (peripheral nerve) (spinal cord): Secondary | ICD-10-CM | POA: Diagnosis not present

## 2021-09-12 DIAGNOSIS — Z9689 Presence of other specified functional implants: Secondary | ICD-10-CM | POA: Diagnosis not present

## 2021-10-15 ENCOUNTER — Encounter: Payer: Self-pay | Admitting: Nurse Practitioner

## 2021-10-15 ENCOUNTER — Ambulatory Visit: Payer: Medicare Other | Admitting: Nurse Practitioner

## 2021-10-15 VITALS — BP 130/78 | HR 89 | Temp 97.4°F | Resp 18 | Ht 72.0 in | Wt >= 6400 oz

## 2021-10-15 DIAGNOSIS — G40019 Localization-related (focal) (partial) idiopathic epilepsy and epileptic syndromes with seizures of localized onset, intractable, without status epilepticus: Secondary | ICD-10-CM

## 2021-10-15 DIAGNOSIS — Z7689 Persons encountering health services in other specified circumstances: Secondary | ICD-10-CM

## 2021-10-15 DIAGNOSIS — G40919 Epilepsy, unspecified, intractable, without status epilepticus: Secondary | ICD-10-CM

## 2021-10-15 DIAGNOSIS — I10 Essential (primary) hypertension: Secondary | ICD-10-CM

## 2021-10-15 DIAGNOSIS — Z9989 Dependence on other enabling machines and devices: Secondary | ICD-10-CM

## 2021-10-15 DIAGNOSIS — G4733 Obstructive sleep apnea (adult) (pediatric): Secondary | ICD-10-CM

## 2021-10-15 DIAGNOSIS — Z23 Encounter for immunization: Secondary | ICD-10-CM | POA: Diagnosis not present

## 2021-10-15 DIAGNOSIS — Z720 Tobacco use: Secondary | ICD-10-CM

## 2021-10-15 DIAGNOSIS — K760 Fatty (change of) liver, not elsewhere classified: Secondary | ICD-10-CM

## 2021-10-15 DIAGNOSIS — E559 Vitamin D deficiency, unspecified: Secondary | ICD-10-CM

## 2021-10-15 DIAGNOSIS — M170 Bilateral primary osteoarthritis of knee: Secondary | ICD-10-CM

## 2021-10-15 MED ORDER — MELOXICAM 15 MG PO TABS: 15.0000 mg | ORAL_TABLET | Freq: Every day | ORAL | 1 refills | Status: DC

## 2021-10-15 NOTE — Progress Notes (Signed)
New Patient Office Visit  Subjective    Patient ID: Randy Cordova., male    DOB: Mar 06, 1970  Age: 51 y.o. MRN: [id number]  CC:  Chief Complaint  Patient presents with   Establish Care    Previous providers with [facility] specialties    HPI Zachari Alberta. presents to establish care   Seizures: Dr. Chaney Born (outpatient neurologist)/ Dr. Linton Flemings (neurosurgeon).  Neurology manages Onfi, Keppra, Trileptal.  Patient also has a vagal nerve stimulator.  States he does have tonic-clonic seizures when they happen vagal nerve stimulator does help prevent some of the postictal episodes.  Patient does have a service dog at his side and no longer drives  Cirrhosis: states wife had cirrhoisis and patient was evaluated by GI. Dr. Lavena Stanford.  This was approximately 10 years ago.  LFTs have been fairly stable per patient report.  OSA: wears CPAP and does get appropriate sleep.  HTN: historaical dx not on meds currenlty   Vitamin D: not on supplements. States that he forgets to buy the over-the-counter version  Low testosterone: states that is was historical but never treated  PSA: Unsure Colonoscopy: 2020 [facility] in [place] TDAP:2015 needs updating 2025 Flu: shot today Shingrix: up date today   Outpatient Encounter Medications as of 10/15/2021  Medication Sig   acetaminophen (TYLENOL) 325 MG tablet Take by mouth.   atorvastatin (LIPITOR) 20 MG tablet Take 20 mg by mouth daily.   cloBAZam (ONFI) 20 MG tablet Take 1 tablet by mouth at bedtime.   hydrocortisone 2.5 % cream Apply topically.   ketoconazole (NIZORAL) 2 % cream Apply topically.   levETIRAcetam (KEPPRA) 750 MG tablet Take 2 tablets (1,500 mg total) by mouth 2 (two) times daily.   oxcarbazepine (TRILEPTAL) 600 MG tablet Take 2 tablets by mouth in the morning and at bedtime.   pyridOXINE (B-6) 200 MG tablet Take 1 tablet (200 mg total) by mouth daily.   valACYclovir (VALTREX) 1000 MG tablet Take 1  tablet (1,000 mg total) by mouth 3 (three) times daily.   meloxicam (MOBIC) 15 MG tablet Take 1 tablet (15 mg total) by mouth daily.   [DISCONTINUED] meloxicam (MOBIC) 15 MG tablet Take by mouth. (Patient not taking: Reported on 10/15/2021)   [DISCONTINUED] predniSONE (DELTASONE) 20 MG tablet Advised to take 60 mg prednisone for 1 day then 50 mg prednisone for 1 day then 40 mg prednisone for 1 day then 30 mg prednisone for 1 day then 20 mg prednisone for 1 day then 10 mg prednisone for 1 day then discontinue.   [DISCONTINUED] topiramate (TOPAMAX) 200 MG tablet Take 200 mg by mouth 2 (two) times daily.   No facility-administered encounter medications on file as of 10/15/2021.    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   Arthritis 2020   Epilepsy (HCC)    Generalized anxiety disorder 12/23/2020   Hyperlipidemia 12/23/2020   Hypothyroidism 10/29/2010   OSA on CPAP    Primary hypertension 12/23/2020   Sleep apnea 1997   On CPAP   TBI (traumatic brain injury) (HCC)    Thrombocytopenia (HCC)    Type 2 diabetes mellitus (HCC) 12/23/2020    Past Surgical History:  Procedure Laterality Date   BACK SURGERY     BRAIN SURGERY  02/27/2021   SEEG   CHOLECYSTECTOMY     FRACTURE SURGERY  Numerous   1985 - 2016   Head surgery     Due to brain  injury   [place]    Family History  Problem Relation Age of Onset   Arthritis/Rheumatoid Mother    Arthritis Mother    Obesity Mother    Obesity Maternal Aunt    Alcohol abuse Maternal Uncle    Cancer Maternal Uncle        lung, colon cancer   Early death Maternal Uncle    Cancer Maternal Grandmother        lung cancer    Social History   Socioeconomic History   Marital status: Married    Spouse name: Fedrick Southward   Number of children: Not on file   Years of education: Not on file   Highest education level: Not on file  Occupational History   Not on file  Tobacco Use    Smoking status: Former    Packs/day: 1.00    Years: 35.00    Total pack years: 35.00    Types: Cigarettes, Cigars, E-cigarettes    Quit date: 03/10/2019    Years since quitting: 2.6   Smokeless tobacco: Never   Tobacco comments:    Quit e cig in 2021 or 2022    Real cig 2018  Vaping Use   Vaping Use: Former  Substance and Sexual Activity   Alcohol use: Not Currently    Comment: not since two years   Drug use: Never   Sexual activity: Not Currently    Birth control/protection: Abstinence  Other Topics Concern   Not on file  Social History Narrative   Not on file   Social Determinants of Health   Financial Resource Strain: Not on file  Food Insecurity: Not on file  Transportation Needs: Not on file  Physical Activity: Not on file  Stress: Not on file  Social Connections: Not on file  Intimate Partner Violence: Not on file    Review of Systems  Constitutional:  Negative for chills and fever.  Respiratory:  Negative for shortness of breath.   Cardiovascular:  Positive for leg swelling. Negative for chest pain.  Gastrointestinal:  Negative for abdominal pain, constipation, diarrhea, nausea and vomiting.       BM daily  Genitourinary:  Negative for dysuria and hematuria.  Neurological:  Positive for dizziness, seizures (gets fuzzy but cannot tell that it is going to happen then) and headaches.        Objective    BP 130/78   Pulse 89   Temp (!) 97.4 F (36.3 C)   Resp 18   Ht 6' (1.829 m)   Wt (!) 452 lb 2 oz (205.1 kg)   SpO2 96%   BMI 61.32 kg/m   Physical Exam Vitals and nursing note reviewed.  Constitutional:      Appearance: Normal appearance. He is obese.  HENT:     Right Ear: Tympanic membrane, ear canal and external ear normal.     Left Ear: Tympanic membrane, ear canal and external ear normal.     Mouth/Throat:     Mouth: Mucous membranes are moist.     Pharynx: Oropharynx is clear.  Eyes:     Extraocular Movements: Extraocular movements  intact.     Pupils: Pupils are equal, round, and reactive to light.     Comments: Wears glasses  Cardiovascular:     Rate and Rhythm: Normal rate and regular rhythm.     Heart sounds: Normal heart sounds.  Pulmonary:     Effort: Pulmonary effort is  normal.     Breath sounds: Normal breath sounds.  Abdominal:     General: Bowel sounds are normal.  Musculoskeletal:     Right lower leg: Edema present.     Left lower leg: Edema present.  Lymphadenopathy:     Cervical: No cervical adenopathy.  Neurological:     Mental Status: He is alert.     Comments: Bilateral upper and lower extremity strength 5/5  Psychiatric:        Mood and Affect: Mood normal.        Behavior: Behavior normal.        Thought Content: Thought content normal.        Judgment: Judgment normal.         Assessment & Plan:   Problem List Items Addressed This Visit       Cardiovascular and Mediastinum   Primary hypertension    This is a historical diagnosis the patient is not currently on any medication.  Blood pressure within normal limits today.  We will continue to monitor      Relevant Medications   atorvastatin (LIPITOR) 20 MG tablet     Respiratory   OSA on CPAP    Patient currently maintained on CPAP therapy and does well.        Digestive   Nonalcoholic fatty liver disease    Patient states he has some fatty liver disease but no hepatic cirrhosis.  He was evaluated by GI approximately 10 years ago.        Nervous and Auditory   Breakthrough seizures, recurrent (HCC)    Patient does have epilepsy along with breakthrough seizures.  Patient states he had a seizure in the lobby while waiting for appointment exam benign, vital signs benign.  Patient states he does have a vagal nerve stimulator that helps      Epilepsy [facility])    Patient being managed by [facility] neurology.  Along with neurosurgery.  Currently on Onfi, Keppra, Trileptal and has a vagal nerve stimulator.        Musculoskeletal  and Integument   Osteoarthritis of both knees   Relevant Medications   acetaminophen (TYLENOL) 325 MG tablet   meloxicam (MOBIC) 15 MG tablet     Other   Vitamin D deficiency    Historical diagnosis patient not currently on any replacement      Tobacco user    Former smoker.  Likely qualifies for LDCT.      Other Visit Diagnoses     Encounter to establish care    -  Primary   Need for influenza vaccination       Relevant Orders   Flu Vaccine QUAD 6+ mos PF IM (Fluarix Quad PF) (Completed)   Need for shingles vaccine       Relevant Orders   Varicella-zoster vaccine IM (Completed)       Return in about 6 weeks (around 11/28/2021) for CPE and labs.   Audria Nine, NP

## 2021-10-15 NOTE — Assessment & Plan Note (Signed)
Patient does have epilepsy along with breakthrough seizures.  Patient states he had a seizure in the lobby while waiting for appointment exam benign, vital signs benign.  Patient states he does have a vagal nerve stimulator that helps

## 2021-10-15 NOTE — Assessment & Plan Note (Signed)
Former smoker.  Likely qualifies for LDCT.

## 2021-10-15 NOTE — Assessment & Plan Note (Signed)
Patient being managed by [facility] neurology.  Along with neurosurgery.  Currently on Onfi, Keppra, Trileptal and has a vagal nerve stimulator.

## 2021-10-15 NOTE — Assessment & Plan Note (Signed)
Patient currently maintained on CPAP therapy and does well.

## 2021-10-15 NOTE — Assessment & Plan Note (Signed)
Patient states he has some fatty liver disease but no hepatic cirrhosis.  He was evaluated by GI approximately 10 years ago.

## 2021-10-15 NOTE — Assessment & Plan Note (Signed)
This is a historical diagnosis the patient is not currently on any medication.  Blood pressure within normal limits today.  We will continue to monitor

## 2021-10-15 NOTE — Assessment & Plan Note (Signed)
Historical diagnosis patient not currently on any replacement

## 2021-10-16 ENCOUNTER — Encounter: Payer: Self-pay | Admitting: Nurse Practitioner

## 2021-10-16 MED ORDER — ATORVASTATIN CALCIUM 20 MG PO TABS: 20.0000 mg | ORAL_TABLET | Freq: Every day | ORAL | 1 refills | Status: DC

## 2021-11-28 DIAGNOSIS — Z4542 Encounter for adjustment and management of neuropacemaker (brain) (peripheral nerve) (spinal cord): Secondary | ICD-10-CM | POA: Diagnosis not present

## 2021-11-28 DIAGNOSIS — G40011 Localization-related (focal) (partial) idiopathic epilepsy and epileptic syndromes with seizures of localized onset, intractable, with status epilepticus: Secondary | ICD-10-CM | POA: Diagnosis not present

## 2021-11-28 DIAGNOSIS — Z87891 Personal history of nicotine dependence: Secondary | ICD-10-CM | POA: Diagnosis not present

## 2021-11-28 DIAGNOSIS — Z79899 Other long term (current) drug therapy: Secondary | ICD-10-CM | POA: Diagnosis not present

## 2021-12-21 ENCOUNTER — Ambulatory Visit (INDEPENDENT_AMBULATORY_CARE_PROVIDER_SITE_OTHER): Payer: Medicare Other | Admitting: Nurse Practitioner

## 2021-12-21 ENCOUNTER — Encounter: Payer: Self-pay | Admitting: Nurse Practitioner

## 2021-12-21 VITALS — BP 128/66 | HR 91 | Temp 97.5°F | Resp 14 | Ht 72.0 in | Wt >= 6400 oz

## 2021-12-21 DIAGNOSIS — G4733 Obstructive sleep apnea (adult) (pediatric): Secondary | ICD-10-CM | POA: Diagnosis not present

## 2021-12-21 DIAGNOSIS — G40919 Epilepsy, unspecified, intractable, without status epilepticus: Secondary | ICD-10-CM

## 2021-12-21 DIAGNOSIS — Z0000 Encounter for general adult medical examination without abnormal findings: Secondary | ICD-10-CM | POA: Insufficient documentation

## 2021-12-21 DIAGNOSIS — Z125 Encounter for screening for malignant neoplasm of prostate: Secondary | ICD-10-CM | POA: Diagnosis not present

## 2021-12-21 DIAGNOSIS — M170 Bilateral primary osteoarthritis of knee: Secondary | ICD-10-CM

## 2021-12-21 DIAGNOSIS — R7303 Prediabetes: Secondary | ICD-10-CM | POA: Insufficient documentation

## 2021-12-21 DIAGNOSIS — R21 Rash and other nonspecific skin eruption: Secondary | ICD-10-CM | POA: Insufficient documentation

## 2021-12-21 DIAGNOSIS — Z122 Encounter for screening for malignant neoplasm of respiratory organs: Secondary | ICD-10-CM

## 2021-12-21 DIAGNOSIS — Z23 Encounter for immunization: Secondary | ICD-10-CM | POA: Insufficient documentation

## 2021-12-21 DIAGNOSIS — G40201 Localization-related (focal) (partial) symptomatic epilepsy and epileptic syndromes with complex partial seizures, not intractable, with status epilepticus: Secondary | ICD-10-CM

## 2021-12-21 DIAGNOSIS — Z8639 Personal history of other endocrine, nutritional and metabolic disease: Secondary | ICD-10-CM | POA: Diagnosis not present

## 2021-12-21 DIAGNOSIS — I10 Essential (primary) hypertension: Secondary | ICD-10-CM | POA: Diagnosis not present

## 2021-12-21 DIAGNOSIS — E039 Hypothyroidism, unspecified: Secondary | ICD-10-CM

## 2021-12-21 DIAGNOSIS — K760 Fatty (change of) liver, not elsewhere classified: Secondary | ICD-10-CM

## 2021-12-21 MED ORDER — MELOXICAM 15 MG PO TABS: 15.0000 mg | ORAL_TABLET | Freq: Every day | ORAL | 1 refills | Status: DC

## 2021-12-21 MED ORDER — KETOCONAZOLE 2 % EX CREA: TOPICAL_CREAM | Freq: Every day | CUTANEOUS | 0 refills | Status: AC

## 2021-12-21 NOTE — Assessment & Plan Note (Signed)
Wears CPAP at home. Does well. Continue

## 2021-12-21 NOTE — Assessment & Plan Note (Signed)
Was evaluted by GI in 2019. States they told him to monitor LFTs. Pending labs today

## 2021-12-21 NOTE — Assessment & Plan Note (Signed)
Patient has fungal rash in folds of skin that uses ketoconazole as needed.  Refill provided

## 2021-12-21 NOTE — Progress Notes (Signed)
Established Patient Office Visit  Subjective   Patient ID: Randy Cordova., male    DOB: 1970-11-03  Age: 51 y.o. MRN: [id number]  Chief Complaint  Patient presents with   Annual Exam    HPI  for complete physical and follow up of chronic conditions.  HTN: Historical dx, controlling it with lifestyle modifications  OSA: Wears CPAP and gets appropriate sleep  Liver  Hypothyroidism  Epilepsy: Dr Chaney Born (neuro), Dr Linton Flemings (neurosurgeon)  [name] D  Immunizations: -Tetanus: 2020 -Influenza:10/15/2021 -Shingles:10/15/2021, update second one today -Pneumonia:   -HPV: aged out  Diet: Fair diet. 2 meals a day. States that does not snack. States that he has cut out soda.  Water with flavoring  Exercise: No regular exercise. Walking around more today has lost more weight   Eye exam: Completes annually. 2 weeks ago. States got glasses new ones today   Dental exam: Needs   Colonoscopy: Completed in 2018 with 10 years Lung Cancer Screening: Amb referl toda Dexa: Completed in  PSA: Due  Sleep: u Sleeps 8 hours that is broken due to medicaton being sedating   Review of Systems  Constitutional:  Negative for chills and fever.  Respiratory:  Negative for shortness of breath.   Cardiovascular:  Negative for chest pain.  Gastrointestinal:  Negative for abdominal pain, blood in stool, diarrhea, nausea and vomiting.       BM several times a day   Genitourinary:  Negative for dysuria and hematuria.  Neurological:  Positive for headaches. Negative for tingling.  Psychiatric/Behavioral:  Negative for hallucinations and suicidal ideas.       Objective:     BP 128/66   Pulse 91   Temp (!) 97.5 F (36.4 C)   Resp 14   Ht 6' (1.829 m)   Wt (!) 441 lb (200 kg)   SpO2 97%   BMI 59.81 kg/m  BP Readings from Last 3 Encounters:  12/21/21 128/66  10/15/21 130/78  12/06/20 (!) 158/76   Wt Readings from Last 3 Encounters:  12/21/21 (!) 441 lb (200 kg)   10/15/21 (!) 452 lb 2 oz (205.1 kg)  12/04/20 (!) 430 lb (195 kg)      Physical Exam Vitals and nursing note reviewed.  Constitutional:      Appearance: Normal appearance. He is obese.  HENT:     Right Ear: Tympanic membrane, ear canal and external ear normal.     Left Ear: Tympanic membrane, ear canal and external ear normal.     Mouth/Throat:     Mouth: Mucous membranes are moist.     Pharynx: Oropharynx is clear.  Eyes:     Extraocular Movements: Extraocular movements intact.     Pupils: Pupils are equal, round, and reactive to light.     Comments: Wears corrective lenses  Cardiovascular:     Rate and Rhythm: Normal rate and regular rhythm.     Heart sounds: Normal heart sounds.  Pulmonary:     Effort: Pulmonary effort is normal.     Breath sounds: Normal breath sounds.  Abdominal:     General: Bowel sounds are normal. There is no distension.     Palpations: There is no mass.     Tenderness: There is no abdominal tenderness.     Hernia: No hernia is present.  Musculoskeletal:     Right lower leg: No edema.     Left lower leg: No edema.  Lymphadenopathy:     Cervical: No cervical adenopathy.  Skin:    General: Skin is warm.  Neurological:     Mental Status: He is alert.  Psychiatric:        Mood and Affect: Mood normal.        Behavior: Behavior normal.        Thought Content: Thought content normal.        Judgment: Judgment normal.     Diabetic Foot Form - Detailed   Diabetic Foot Exam - detailed Diabetic Foot exam was performed with the following findings: Yes   Is there swelling or and abnormal foot shape?: No Is there a claw toe deformity?: No Is there elevated skin temparature?: No Pulse Foot Exam completed.: Yes   Right posterior Tibialias: Present Left posterior Tibialias: Present   Right Dorsalis Pedis: Present Left Dorsalis Pedis: Present  Semmes-Weinstein Monofilament Test   Comments: Site 2 5,6, absent right  Sites 5 6 absent   left  Callus on the bilateral posterior portions  Dry skin bilateral feet     No results found for any visits on 12/21/21.    The 10-year ASCVD risk score (Arnett DK, et al., 2019) is: 8.5%    Assessment & Plan:   Problem List Items Addressed This Visit       Cardiovascular and Mediastinum   Primary hypertension    Currently controlled with lifestyle modifications. BP WNL in office today      Relevant Orders   CBC   Lipid panel   Comprehensive metabolic panel     Respiratory   OSA on CPAP - Primary    Wears CPAP at home. Does well. Continue        Digestive   Nonalcoholic fatty liver disease    Was evaluted by GI in 2019. States they told him to monitor LFTs. Pending labs today         Endocrine   Hypothyroidism    Historical dx. Not currently on medications. Pending labs today       Relevant Orders   TSH     Nervous and Auditory   Breakthrough seizures, recurrent (HCC)    2/2 sees neurology and neurosurgery. Meds and VNS.       Epilepsy [facility])    Patient is followed by Neurology and neurosurgery. He is on Triliptel, onfi, and keppra. Patient also has a vagal nerve stimulator        Musculoskeletal and Integument   Osteoarthritis of both knees    Patient meloxicam 15 mg daily.  Refill sent in today.  Pending labs.      Relevant Medications   meloxicam (MOBIC) 15 MG tablet   Rash    Patient has fungal rash in folds of skin that uses ketoconazole as needed.  Refill provided      Relevant Medications   ketoconazole (NIZORAL) 2 % cream     Other   Morbid obesity (HCC)    Patient is working on mobility.  He has lost some weight since last office visit continue      Relevant Orders   Lipid panel   Preventative health care    Discussed age-appropriate immunizations and screening exams.  Patient is up-to-date on immunizations.  Up-to-date on colonoscopy.  Update PSA and LDCT today.  Patient was given information at discharge about preventative  healthcare maintenance with anticipatory guidance for his age range.      Relevant Orders   CBC   Lipid panel   TSH   Prediabetes    Historical diagnosis  of diabetes.  Last 2 A1c's in chart were prediabetic range.  Update foot exam today get urine microalbumin.  Pending labs      Relevant Orders   Hemoglobin A1c   Need for shingles vaccine    Patient received first dose of shingles vaccine last office visit.  Did get a bill.  Patient states he is willing to get the second vaccine today did tell him he might get a second bill he said he checked with insurance and stated that the fee he paid covers the series of vaccines.  Did tell patient he can go to the pharmacy if you like patient declined states that he will deal with it if he gets a bill patient wanted a second shingles vaccine here today was administered.      Relevant Orders   Zoster Recombinant (Shingrix ) (Completed)   Other Visit Diagnoses     Screening for prostate cancer       Relevant Orders   PSA   Screening for lung cancer       Relevant Orders   Ambulatory Referral Lung Cancer Screening [facility]       Return in about 6 months (around 06/21/2022) for Recheck chronic conditions.    Audria Nine, NP

## 2021-12-21 NOTE — Assessment & Plan Note (Signed)
Patient meloxicam 15 mg daily.  Refill sent in today.  Pending labs.

## 2021-12-21 NOTE — Assessment & Plan Note (Signed)
Historical dx. Not currently on medications. Pending labs today

## 2021-12-21 NOTE — Assessment & Plan Note (Signed)
Patient is working on mobility.  He has lost some weight since last office visit continue

## 2021-12-21 NOTE — Assessment & Plan Note (Signed)
2/2 sees neurology and neurosurgery. Meds and VNS.

## 2021-12-21 NOTE — Assessment & Plan Note (Signed)
Patient received first dose of shingles vaccine last office visit.  Did get a bill.  Patient states he is willing to get the second vaccine today did tell him he might get a second bill he said he checked with insurance and stated that the fee he paid covers the series of vaccines.  Did tell patient he can go to the pharmacy if you like patient declined states that he will deal with it if he gets a bill patient wanted a second shingles vaccine here today was administered.

## 2021-12-21 NOTE — Assessment & Plan Note (Signed)
Historical diagnosis of diabetes.  Last 2 A1c's in chart were prediabetic range.  Update foot exam today get urine microalbumin.  Pending labs

## 2021-12-21 NOTE — Assessment & Plan Note (Signed)
Discussed age-appropriate immunizations and screening exams.  Patient is up-to-date on immunizations.  Up-to-date on colonoscopy.  Update PSA and LDCT today.  Patient was given information at discharge about preventative healthcare maintenance with anticipatory guidance for his age range.

## 2021-12-21 NOTE — Patient Instructions (Signed)
Nice to see you today I will be int ouch with the labs once I have them Follow up with me in 6 months, sooner if you need me 

## 2021-12-21 NOTE — Assessment & Plan Note (Signed)
Patient is followed by Neurology and neurosurgery. He is on Triliptel, onfi, and keppra. Patient also has a vagal nerve stimulator

## 2021-12-21 NOTE — Addendum Note (Signed)
Addended by: Shon Millet on: 12/21/2021 04:45 PM   Modules accepted: Orders

## 2021-12-21 NOTE — Assessment & Plan Note (Signed)
Currently controlled with lifestyle modifications. BP WNL in office today

## 2021-12-21 NOTE — Addendum Note (Signed)
Addended by: Eden Emms on: 12/21/2021 04:42 PM   Modules accepted: Orders

## 2021-12-22 ENCOUNTER — Encounter: Payer: Self-pay | Admitting: Nurse Practitioner

## 2021-12-22 LAB — HEMOGLOBIN A1C
Hgb A1c MFr Bld: 5.8 % of total Hgb — ABNORMAL HIGH (ref <5.7)
Mean Plasma Glucose: 120 mg/dL
eAG (mmol/L): 6.6 mmol/L

## 2021-12-22 LAB — CBC
HCT: 40.8 % (ref 38.5–50.0)
Hemoglobin: 13.8 g/dL (ref 13.2–17.1)
MCH: 29.4 pg (ref 27.0–33.0)
MCHC: 33.8 g/dL (ref 32.0–36.0)
MCV: 86.8 fL (ref 80.0–100.0)
MPV: 10.6 fL (ref 7.5–12.5)
Platelets: 87 10*3/uL — ABNORMAL LOW (ref 140–400)
RBC: 4.7 10*6/uL (ref 4.20–5.80)
RDW: 13.5 % (ref 11.0–15.0)
WBC: 4.7 10*3/uL (ref 3.8–10.8)

## 2021-12-22 LAB — PSA: PSA: 0.09 ng/mL (ref <=4.00)

## 2021-12-22 LAB — MICROALBUMIN / CREATININE URINE RATIO
Creatinine, Urine: 224 mg/dL (ref 20–320)
Microalb Creat Ratio: 3 mcg/mg creat (ref <30)
Microalb, Ur: 0.7 mg/dL

## 2021-12-22 LAB — COMPREHENSIVE METABOLIC PANEL
AG Ratio: 1.3 (calc) (ref 1.0–2.5)
ALT: 22 U/L (ref 9–46)
AST: 17 U/L (ref 10–35)
Albumin: 4 g/dL (ref 3.6–5.1)
Alkaline phosphatase (APISO): 103 U/L (ref 35–144)
BUN/Creatinine Ratio: 24 (calc) — ABNORMAL HIGH (ref 6–22)
BUN: 16 mg/dL (ref 7–25)
CO2: 26 mmol/L (ref 20–32)
Calcium: 8.7 mg/dL (ref 8.6–10.3)
Chloride: 106 mmol/L (ref 98–110)
Creat: 0.67 mg/dL — ABNORMAL LOW (ref 0.70–1.30)
Globulin: 3.2 g/dL (calc) (ref 1.9–3.7)
Glucose, Bld: 86 mg/dL (ref 65–99)
Potassium: 4.2 mmol/L (ref 3.5–5.3)
Sodium: 140 mmol/L (ref 135–146)
Total Bilirubin: 0.4 mg/dL (ref 0.2–1.2)
Total Protein: 7.2 g/dL (ref 6.1–8.1)

## 2021-12-22 LAB — LIPID PANEL
Cholesterol: 168 mg/dL (ref <200)
HDL: 47 mg/dL (ref >=40)
LDL Cholesterol (Calc): 91 mg/dL (calc)
Non-HDL Cholesterol (Calc): 121 mg/dL (calc) (ref <130)
Total CHOL/HDL Ratio: 3.6 (calc) (ref <5.0)
Triglycerides: 210 mg/dL — ABNORMAL HIGH (ref <150)

## 2021-12-22 LAB — TSH: TSH: 2.22 mIU/L (ref 0.40–4.50)

## 2021-12-24 ENCOUNTER — Other Ambulatory Visit: Payer: Self-pay | Admitting: Nurse Practitioner

## 2021-12-24 DIAGNOSIS — R7989 Other specified abnormal findings of blood chemistry: Secondary | ICD-10-CM

## 2021-12-24 DIAGNOSIS — K760 Fatty (change of) liver, not elsewhere classified: Secondary | ICD-10-CM

## 2021-12-24 NOTE — Telephone Encounter (Signed)
Patient scheduled.

## 2021-12-24 NOTE — Telephone Encounter (Signed)
Called and spoke with patient on the phone about numbers. Will recheck labs in approx 3 months. He is suppose to call the office and set up. Can we make sure he gets a lab visit please

## 2022-01-21 ENCOUNTER — Encounter: Payer: Self-pay | Admitting: Nurse Practitioner

## 2022-01-21 NOTE — Telephone Encounter (Signed)
Can we get him scheduled with someone to get antiviral for covid please. Vitrual visit is fine

## 2022-01-21 NOTE — Telephone Encounter (Signed)
Spoke to patient by telephone and virtual visit scheduled for 01/22/22 at 12:20 pm. Patient was given ER precautions and he verbalized understanding. Patient stated that his main complaint is a sore throat.

## 2022-01-22 ENCOUNTER — Telehealth (INDEPENDENT_AMBULATORY_CARE_PROVIDER_SITE_OTHER): Payer: Medicare Other | Admitting: Nurse Practitioner

## 2022-01-22 ENCOUNTER — Encounter: Payer: Self-pay | Admitting: Nurse Practitioner

## 2022-01-22 VITALS — HR 76 | Temp 98.5°F | Ht 72.0 in | Wt >= 6400 oz

## 2022-01-22 DIAGNOSIS — U071 COVID-19: Secondary | ICD-10-CM | POA: Diagnosis not present

## 2022-01-22 MED ORDER — MOLNUPIRAVIR EUA 200MG CAPSULE: 4.0000 | ORAL_CAPSULE | Freq: Two times a day (BID) | ORAL | 0 refills | Status: AC

## 2022-01-22 NOTE — Assessment & Plan Note (Signed)
Positive COVID-19 test at home.  Discussed antiviral treatments and that they are EUA only.  After joint discussion elected use molnupiravir.  Patient is unsure if he wants to take medication we will send prescription anyway.  Timeframe precautions reviewed that he has not started by Friday physical started.  Signs and symptoms reviewed when to seek urgent emergent healthcare.  Did review CDC guidelines/recommendations in regards to self-isolation and quarantine.  Follow-up if no improvement

## 2022-01-22 NOTE — Progress Notes (Signed)
Patient ID: Randy Cordova., male    DOB: Dec 10, 1970, 51 y.o.   MRN: [id number]  Virtual visit completed through [employer], a video enabled telemedicine application. Due to national recommendations of social distancing due to COVID-19, a virtual visit is felt to be most appropriate for this patient at this time. Reviewed limitations, risks, security and privacy concerns of performing a virtual visit and the availability of in person appointments. I also reviewed that there may be a patient responsible charge related to this service. The patient agreed to proceed.   Patient location: home Provider location: [place] [facility], office Persons participating in this virtual visit: patient, provider   If any vitals were documented, they were collected by patient at home unless specified below.    Pulse 76   Temp 98.5 F (36.9 C)   Ht 6' (1.829 m)   Wt (!) 441 lb (200 kg)   SpO2 99%   BMI 59.81 kg/m    CC: Covid 19 Subjective:   HPI: Randy Cordova. is a 51 y.o. male presenting on 01/22/2022 for Covid Positive (Sunday ), Sore Throat, and Cough   Symtpoms started Sunday Best friend has covid and was exposed to him Sunday at home covid test that was positive Has been taking tylenol    Relevant past medical, surgical, family and social history reviewed and updated as indicated. Interim medical history since our last visit reviewed. Allergies and medications reviewed and updated. Outpatient Medications Prior to Visit  Medication Sig Dispense Refill   acetaminophen (TYLENOL) 325 MG tablet Take by mouth.     atorvastatin (LIPITOR) 20 MG tablet Take 1 tablet (20 mg total) by mouth daily. 90 tablet 1   cloBAZam (ONFI) 20 MG tablet Take 1 tablet by mouth at bedtime.     ketoconazole (NIZORAL) 2 % cream Apply topically daily. 30 g 0   levETIRAcetam (KEPPRA) 750 MG tablet Take 2 tablets (1,500 mg total) by mouth 2 (two) times daily. 120 tablet 2   pyridOXINE (B-6) 200 MG tablet  Take 1 tablet (200 mg total) by mouth daily. 30 tablet 1   meloxicam (MOBIC) 15 MG tablet Take 1 tablet (15 mg total) by mouth daily. (Patient not taking: Reported on 01/22/2022) 90 tablet 1   oxcarbazepine (TRILEPTAL) 600 MG tablet Take 2 tablets by mouth in the morning and at bedtime.     No facility-administered medications prior to visit.     Per HPI unless specifically indicated in ROS section below Review of Systems  Constitutional:  Positive for chills, fatigue and fever.  HENT:  Positive for sore throat.   Respiratory:  Positive for cough.   Gastrointestinal:  Positive for abdominal pain. Negative for diarrhea and nausea.  Musculoskeletal:  Positive for arthralgias.  Neurological:  Positive for headaches.   Objective:  Pulse 76   Temp 98.5 F (36.9 C)   Ht 6' (1.829 m)   Wt (!) 441 lb (200 kg)   SpO2 99%   BMI 59.81 kg/m   Wt Readings from Last 3 Encounters:  01/22/22 (!) 441 lb (200 kg)  12/21/21 (!) 441 lb (200 kg)  10/15/21 (!) 452 lb 2 oz (205.1 kg)       Physical exam: Gen: alert, NAD, not ill appearing Pulm: speaks in complete sentences without increased work of breathing Psych: normal mood, normal thought content      Results for orders placed or performed in visit on 12/21/21  CBC  Result Value Ref Range  WBC 4.7 3.8 - 10.8 Thousand/uL   RBC 4.70 4.20 - 5.80 Million/uL   Hemoglobin 13.8 13.2 - 17.1 g/dL   HCT 40.8 38.5 - 50.0 %   MCV 86.8 80.0 - 100.0 fL   MCH 29.4 27.0 - 33.0 pg   MCHC 33.8 32.0 - 36.0 g/dL   RDW 13.5 11.0 - 15.0 %   Platelets 87 (L) 140 - 400 Thousand/uL   MPV 10.6 7.5 - 12.5 fL  Lipid panel  Result Value Ref Range   Cholesterol 168 <200 mg/dL   HDL 47 > OR = 40 mg/dL   Triglycerides 210 (H) <150 mg/dL   LDL Cholesterol (Calc) 91 mg/dL (calc)   Total CHOL/HDL Ratio 3.6 <5.0 (calc)   Non-HDL Cholesterol (Calc) 121 <130 mg/dL (calc)  TSH  Result Value Ref Range   TSH 2.22 0.40 - 4.50 mIU/L  Comprehensive metabolic panel   Result Value Ref Range   Glucose, Bld 86 65 - 99 mg/dL   BUN 16 7 - 25 mg/dL   Creat 0.67 (L) 0.70 - 1.30 mg/dL   BUN/Creatinine Ratio 24 (H) 6 - 22 (calc)   Sodium 140 135 - 146 mmol/L   Potassium 4.2 3.5 - 5.3 mmol/L   Chloride 106 98 - 110 mmol/L   CO2 26 20 - 32 mmol/L   Calcium 8.7 8.6 - 10.3 mg/dL   Total Protein 7.2 6.1 - 8.1 g/dL   Albumin 4.0 3.6 - 5.1 g/dL   Globulin 3.2 1.9 - 3.7 g/dL (calc)   AG Ratio 1.3 1.0 - 2.5 (calc)   Total Bilirubin 0.4 0.2 - 1.2 mg/dL   Alkaline phosphatase (APISO) 103 35 - 144 U/L   AST 17 10 - 35 U/L   ALT 22 9 - 46 U/L  Hemoglobin A1c  Result Value Ref Range   Hgb A1c MFr Bld 5.8 (H) <5.7 % of total Hgb   Mean Plasma Glucose 120 mg/dL   eAG (mmol/L) 6.6 mmol/L  PSA  Result Value Ref Range   PSA 0.09 < OR = 4.00 ng/mL  Microalbumin / creatinine urine ratio  Result Value Ref Range   Creatinine, Urine 224 20 - 320 mg/dL   Microalb, Ur 0.7 mg/dL   Microalb Creat Ratio 3 <30 mcg/mg creat   Assessment & Plan:   Problem List Items Addressed This Visit       Other   COVID-19 - Primary    Positive COVID-19 test at home.  Discussed antiviral treatments and that they are EUA only.  After joint discussion elected use molnupiravir.  Patient is unsure if he wants to take medication we will send prescription anyway.  Timeframe precautions reviewed that he has not started by Friday physical started.  Signs and symptoms reviewed when to seek urgent emergent healthcare.  Did review CDC guidelines/recommendations in regards to self-isolation and quarantine.  Follow-up if no improvement      Relevant Medications   molnupiravir EUA (LAGEVRIO) 200 mg CAPS capsule     Meds ordered this encounter  Medications   molnupiravir EUA (LAGEVRIO) 200 mg CAPS capsule    Sig: Take 4 capsules (800 mg total) by mouth 2 (two) times daily for 5 days.    Dispense:  40 capsule    Refill:  0    Order Specific Question:   Supervising Provider    Answer:   TOWER,  MARNE A [[id number]]   No orders of the defined types were placed in this encounter.   I discussed  the assessment and treatment plan with the patient. The patient was provided an opportunity to ask questions and all were answered. The patient agreed with the plan and demonstrated an understanding of the instructions. The patient was advised to call back or seek an in-person evaluation if the symptoms worsen or if the condition fails to improve as anticipated.  Follow up plan: Return if symptoms worsen or fail to improve.  Audria Nine, NP

## 2022-01-22 NOTE — Telephone Encounter (Signed)
Patient was evaluated by virtual visit today

## 2022-01-25 ENCOUNTER — Other Ambulatory Visit: Payer: Self-pay | Admitting: Nurse Practitioner

## 2022-02-21 ENCOUNTER — Encounter: Payer: Self-pay | Admitting: Nurse Practitioner

## 2022-03-07 ENCOUNTER — Other Ambulatory Visit (INDEPENDENT_AMBULATORY_CARE_PROVIDER_SITE_OTHER): Payer: Medicare Other

## 2022-03-07 DIAGNOSIS — K760 Fatty (change of) liver, not elsewhere classified: Secondary | ICD-10-CM | POA: Diagnosis not present

## 2022-03-07 DIAGNOSIS — R7989 Other specified abnormal findings of blood chemistry: Secondary | ICD-10-CM | POA: Diagnosis not present

## 2022-03-07 DIAGNOSIS — G40011 Localization-related (focal) (partial) idiopathic epilepsy and epileptic syndromes with seizures of localized onset, intractable, with status epilepticus: Secondary | ICD-10-CM | POA: Diagnosis not present

## 2022-03-07 LAB — COMPREHENSIVE METABOLIC PANEL
ALT: 25 U/L (ref 0–53)
AST: 21 U/L (ref 0–37)
Albumin: 4.2 g/dL (ref 3.5–5.2)
Alkaline Phosphatase: 94 U/L (ref 39–117)
BUN: 12 mg/dL (ref 6–23)
CO2: 23 mEq/L (ref 19–32)
Calcium: 8.9 mg/dL (ref 8.4–10.5)
Chloride: 105 mEq/L (ref 96–112)
Creatinine, Ser: 0.9 mg/dL (ref 0.40–1.50)
GFR: 99.25 mL/min (ref >60.00)
Glucose, Bld: 83 mg/dL (ref 70–99)
Potassium: 3.8 mEq/L (ref 3.5–5.1)
Sodium: 138 mEq/L (ref 135–145)
Total Bilirubin: 0.4 mg/dL (ref 0.2–1.2)
Total Protein: 7.5 g/dL (ref 6.0–8.3)

## 2022-03-07 LAB — CBC
HCT: 41.2 % (ref 39.0–52.0)
Hemoglobin: 13.9 g/dL (ref 13.0–17.0)
MCHC: 33.9 g/dL (ref 30.0–36.0)
MCV: 87.1 fl (ref 78.0–100.0)
Platelets: 112 10*3/uL — ABNORMAL LOW (ref 150.0–400.0)
RBC: 4.73 Mil/uL (ref 4.22–5.81)
RDW: 15.1 % (ref 11.5–15.5)
WBC: 5.3 10*3/uL (ref 4.0–10.5)

## 2022-03-07 LAB — VITAMIN B12: Vitamin B-12: 1235 pg/mL — ABNORMAL HIGH (ref 211–911)

## 2022-03-08 ENCOUNTER — Encounter: Payer: Self-pay | Admitting: Nurse Practitioner

## 2022-09-04 ENCOUNTER — Encounter (INDEPENDENT_AMBULATORY_CARE_PROVIDER_SITE_OTHER): Payer: Self-pay

## 2022-09-09 ENCOUNTER — Other Ambulatory Visit: Payer: Self-pay | Admitting: Nurse Practitioner

## 2022-09-10 NOTE — Telephone Encounter (Signed)
LAST APPOINTMENT DATE: Visit date not found   NEXT APPOINTMENT DATE: 10/03/2022    LAST REFILL:  QTY:

## 2022-10-03 ENCOUNTER — Ambulatory Visit (INDEPENDENT_AMBULATORY_CARE_PROVIDER_SITE_OTHER): Payer: Medicare Other | Admitting: Nurse Practitioner

## 2022-10-03 ENCOUNTER — Ambulatory Visit (INDEPENDENT_AMBULATORY_CARE_PROVIDER_SITE_OTHER)
Admission: RE | Admit: 2022-10-03 | Discharge: 2022-10-03 | Disposition: A | Discharge status: Home / Self Care | Payer: Medicare Other | Source: Ambulatory Visit | Attending: Nurse Practitioner | Admitting: Nurse Practitioner

## 2022-10-03 ENCOUNTER — Encounter: Payer: Self-pay | Admitting: Nurse Practitioner

## 2022-10-03 VITALS — BP 138/80 | HR 80 | Temp 99.7°F | Ht 72.0 in | Wt >= 6400 oz

## 2022-10-03 DIAGNOSIS — Z9689 Presence of other specified functional implants: Secondary | ICD-10-CM | POA: Diagnosis not present

## 2022-10-03 DIAGNOSIS — Z1159 Encounter for screening for other viral diseases: Secondary | ICD-10-CM | POA: Diagnosis not present

## 2022-10-03 DIAGNOSIS — R7303 Prediabetes: Secondary | ICD-10-CM | POA: Diagnosis not present

## 2022-10-03 DIAGNOSIS — M79671 Pain in right foot: Secondary | ICD-10-CM

## 2022-10-03 DIAGNOSIS — I10 Essential (primary) hypertension: Secondary | ICD-10-CM

## 2022-10-03 DIAGNOSIS — G40319 Generalized idiopathic epilepsy and epileptic syndromes, intractable, without status epilepticus: Secondary | ICD-10-CM

## 2022-10-03 DIAGNOSIS — K760 Fatty (change of) liver, not elsewhere classified: Secondary | ICD-10-CM | POA: Diagnosis not present

## 2022-10-03 DIAGNOSIS — M7731 Calcaneal spur, right foot: Secondary | ICD-10-CM | POA: Diagnosis not present

## 2022-10-03 DIAGNOSIS — S99921A Unspecified injury of right foot, initial encounter: Secondary | ICD-10-CM | POA: Diagnosis not present

## 2022-10-03 DIAGNOSIS — E039 Hypothyroidism, unspecified: Secondary | ICD-10-CM | POA: Diagnosis not present

## 2022-10-03 DIAGNOSIS — M19071 Primary osteoarthritis, right ankle and foot: Secondary | ICD-10-CM | POA: Diagnosis not present

## 2022-10-03 DIAGNOSIS — M170 Bilateral primary osteoarthritis of knee: Secondary | ICD-10-CM

## 2022-10-03 DIAGNOSIS — S069XAA Unspecified intracranial injury with loss of consciousness status unknown, initial encounter: Secondary | ICD-10-CM | POA: Diagnosis not present

## 2022-10-03 DIAGNOSIS — Z79899 Other long term (current) drug therapy: Secondary | ICD-10-CM | POA: Diagnosis not present

## 2022-10-03 DIAGNOSIS — G40011 Localization-related (focal) (partial) idiopathic epilepsy and epileptic syndromes with seizures of localized onset, intractable, with status epilepticus: Secondary | ICD-10-CM | POA: Diagnosis not present

## 2022-10-03 DIAGNOSIS — M7989 Other specified soft tissue disorders: Secondary | ICD-10-CM | POA: Insufficient documentation

## 2022-10-03 DIAGNOSIS — Z125 Encounter for screening for malignant neoplasm of prostate: Secondary | ICD-10-CM | POA: Diagnosis not present

## 2022-10-03 DIAGNOSIS — G4733 Obstructive sleep apnea (adult) (pediatric): Secondary | ICD-10-CM

## 2022-10-03 DIAGNOSIS — Z6841 Body Mass Index (BMI) 40.0 and over, adult: Secondary | ICD-10-CM | POA: Diagnosis not present

## 2022-10-03 MED ORDER — MELOXICAM 15 MG PO TABS
15.0000 mg | ORAL_TABLET | Freq: Every day | ORAL | 1 refills | Status: DC
Start: 2022-10-03 — End: 2023-03-31

## 2022-10-03 NOTE — Patient Instructions (Signed)
Nice to see you today See me in November for you physical, sooner if you need me

## 2022-10-03 NOTE — Assessment & Plan Note (Signed)
Patient saw neurologist today.  They did adjust the settings on his VNS.  Still currently on Trileptal, Keppra, Onfi

## 2022-10-03 NOTE — Assessment & Plan Note (Signed)
Doing well on CPAP therapy continue

## 2022-10-03 NOTE — Assessment & Plan Note (Signed)
Pending TSH 

## 2022-10-03 NOTE — Assessment & Plan Note (Signed)
Status [place] injuring the airport.  Tenderness to palpation pending foot x-ray patient use RICE therapy along with over-the-counter analgesics or meloxicam as directed

## 2022-10-03 NOTE — Progress Notes (Signed)
Established Patient Office Visit  Subjective   Patient ID: Randy Cordova., male    DOB: May 15, 1970  Age: 52 y.o. MRN: [id number]  Chief Complaint  Patient presents with   general health     Pt requests a overall health checkup. Also complains of right foot pain that started Saturday. No swelling.    Medication Refill    Meloxicam refill.      OSA: still on the CPAP therapy and tolerating it well  Foot pain: right foot got caught between the wheelchair and walker in the airport. States that at rest it is sore States that in the morning and it gets worse with the day. Weight baring makes it worse this was over a week ago. Has it tried ibuprofen that hass helped some. Descried as a "pain pain "   Epilepsy: neurology an has keppra and trileptal. States that they readjusted the VNS.       Review of Systems  Constitutional:  Negative for chills and fever.  Respiratory:  Negative for shortness of breath.   Cardiovascular:  Negative for chest pain.  Musculoskeletal:  Positive for joint pain.  Neurological:  Negative for headaches.  Psychiatric/Behavioral:  Negative for hallucinations and suicidal ideas.       Objective:     BP 138/80   Pulse 80   Temp 99.7 F (37.6 C) (Temporal)   Ht 6' (1.829 m)   Wt (!) 447 lb (202.8 kg)   SpO2 97%   BMI 60.62 kg/m  BP Readings from Last 3 Encounters:  10/03/22 138/80  12/21/21 128/66  10/15/21 130/78   Wt Readings from Last 3 Encounters:  10/03/22 (!) 447 lb (202.8 kg)  01/22/22 (!) 441 lb (200 kg)  12/21/21 (!) 441 lb (200 kg)      Physical Exam Vitals and nursing note reviewed.  Constitutional:      Appearance: Normal appearance.  Cardiovascular:     Rate and Rhythm: Normal rate and regular rhythm.     Heart sounds: Normal heart sounds.  Pulmonary:     Effort: Pulmonary effort is normal.     Breath sounds: Normal breath sounds.  Abdominal:     General: Bowel sounds are normal.  Musculoskeletal:         General: Tenderness present.     Right lower leg: 1+ Pitting Edema present.     Left lower leg: 1+ Pitting Edema present.       Legs:  Neurological:     Mental Status: He is alert.      No results found for any visits on 10/03/22.    The 10-year ASCVD risk score (Arnett DK, et al., 2019) is: 7%    Assessment & Plan:   Problem List Items Addressed This Visit       Cardiovascular and Mediastinum   Primary hypertension    Patient managing it with lifestyle modifications continue      Relevant Orders   CBC   Comprehensive metabolic panel   TSH     Respiratory   OSA on CPAP    Doing well on CPAP therapy continue        Digestive   Nonalcoholic fatty liver disease    History of the same will check liver functions today along with platelets as they were low        Endocrine   Hypothyroidism    Pending TSH      Relevant Orders   TSH     Nervous  and Auditory   Epilepsy [facility])    Patient saw neurologist today.  They did adjust the settings on his VNS.  Still currently on Trileptal, Keppra, Onfi      Relevant Medications   Midazolam (NAYZILAM) 5 MG/0.1ML SOLN     Musculoskeletal and Integument   Osteoarthritis of both knees   Relevant Medications   meloxicam (MOBIC) 15 MG tablet     Other   Morbid obesity (HCC)   Relevant Orders   Hemoglobin A1c   Lipid panel   Prediabetes   Relevant Orders   Hemoglobin A1c   Lipid panel   Foot pain, right    Status post injuring the airport.  Tenderness to palpation pending foot x-ray patient use RICE therapy along with over-the-counter analgesics or meloxicam as directed      Relevant Orders   DG Foot Complete Right (Completed)   Localized swelling of lower extremity    Recently traveled back from [place].  No shortness of breath or chest pain pending BMP today      Relevant Orders   Brain natriuretic peptide   Other Visit Diagnoses     Encounter for hepatitis C screening test for low risk patient    -   Primary   Relevant Orders   Hepatitis C Antibody   Screening for prostate cancer       Relevant Orders   PSA       Return in about 3 months (around 01/03/2023) for CPE and Labs.    Audria Nine, NP

## 2022-10-03 NOTE — Assessment & Plan Note (Signed)
Recently traveled back from [place].  No shortness of breath or chest pain pending BMP today

## 2022-10-03 NOTE — Assessment & Plan Note (Signed)
History of the same will check liver functions today along with platelets as they were low

## 2022-10-03 NOTE — Assessment & Plan Note (Signed)
Patient managing it with lifestyle modifications continue

## 2022-10-04 ENCOUNTER — Encounter: Payer: Self-pay | Admitting: Nurse Practitioner

## 2022-10-04 LAB — CBC
HCT: 40.8 % (ref 39.0–52.0)
Hemoglobin: 13.5 g/dL (ref 13.0–17.0)
MCHC: 33.2 g/dL (ref 30.0–36.0)
MCV: 89.3 fl (ref 78.0–100.0)
Platelets: 116 10*3/uL — ABNORMAL LOW (ref 150.0–400.0)
RBC: 4.56 Mil/uL (ref 4.22–5.81)
RDW: 14.5 % (ref 11.5–15.5)
WBC: 6.1 10*3/uL (ref 4.0–10.5)

## 2022-10-04 LAB — HEPATITIS C ANTIBODY: Hepatitis C Ab: NONREACTIVE

## 2022-10-04 LAB — COMPREHENSIVE METABOLIC PANEL
ALT: 17 U/L (ref 0–53)
AST: 14 U/L (ref 0–37)
Albumin: 3.9 g/dL (ref 3.5–5.2)
Alkaline Phosphatase: 108 U/L (ref 39–117)
BUN: 11 mg/dL (ref 6–23)
CO2: 24 mEq/L (ref 19–32)
Calcium: 8.8 mg/dL (ref 8.4–10.5)
Chloride: 101 mEq/L (ref 96–112)
Creatinine, Ser: 0.78 mg/dL (ref 0.40–1.50)
GFR: 103.22 mL/min (ref >60.00)
Glucose, Bld: 82 mg/dL (ref 70–99)
Potassium: 4.1 meq/L (ref 3.5–5.1)
Sodium: 135 mEq/L (ref 135–145)
Total Bilirubin: 0.4 mg/dL (ref 0.2–1.2)
Total Protein: 6.9 g/dL (ref 6.0–8.3)

## 2022-10-04 LAB — BRAIN NATRIURETIC PEPTIDE: Pro B Natriuretic peptide (BNP): 34 pg/mL (ref 0.0–100.0)

## 2022-10-04 LAB — LIPID PANEL
Cholesterol: 162 mg/dL (ref 0–200)
HDL: 51.7 mg/dL (ref >39.00)
LDL Cholesterol: 85 mg/dL (ref 0–99)
NonHDL: 110.6
Total CHOL/HDL Ratio: 3
Triglycerides: 129 mg/dL (ref 0.0–149.0)
VLDL: 25.8 mg/dL (ref 0.0–40.0)

## 2022-10-04 LAB — HEMOGLOBIN A1C: Hgb A1c MFr Bld: 5.3 % (ref 4.6–6.5)

## 2022-10-04 LAB — PSA: PSA: 0.14 ng/mL (ref 0.10–4.00)

## 2022-10-04 LAB — TSH: TSH: 2.49 u[IU]/mL (ref 0.35–5.50)

## 2022-10-21 ENCOUNTER — Emergency Department: Payer: Medicare Other

## 2022-10-21 ENCOUNTER — Other Ambulatory Visit: Payer: Self-pay

## 2022-10-21 ENCOUNTER — Inpatient Hospital Stay
Admission: EM | Admit: 2022-10-21 | Discharge: 2022-11-01 | DRG: 556 | Disposition: A | Discharge status: Skilled Nursing Facility | Payer: Medicare Other | Attending: Internal Medicine | Admitting: Internal Medicine

## 2022-10-21 DIAGNOSIS — M79671 Pain in right foot: Secondary | ICD-10-CM | POA: Diagnosis not present

## 2022-10-21 DIAGNOSIS — M170 Bilateral primary osteoarthritis of knee: Secondary | ICD-10-CM | POA: Diagnosis present

## 2022-10-21 DIAGNOSIS — G40219 Localization-related (focal) (partial) symptomatic epilepsy and epileptic syndromes with complex partial seizures, intractable, without status epilepticus: Secondary | ICD-10-CM | POA: Diagnosis present

## 2022-10-21 DIAGNOSIS — Z9682 Presence of neurostimulator: Secondary | ICD-10-CM | POA: Diagnosis not present

## 2022-10-21 DIAGNOSIS — R52 Pain, unspecified: Secondary | ICD-10-CM | POA: Diagnosis not present

## 2022-10-21 DIAGNOSIS — G4733 Obstructive sleep apnea (adult) (pediatric): Secondary | ICD-10-CM | POA: Diagnosis present

## 2022-10-21 DIAGNOSIS — S069XAS Unspecified intracranial injury with loss of consciousness status unknown, sequela: Secondary | ICD-10-CM

## 2022-10-21 DIAGNOSIS — F411 Generalized anxiety disorder: Secondary | ICD-10-CM | POA: Diagnosis present

## 2022-10-21 DIAGNOSIS — K760 Fatty (change of) liver, not elsewhere classified: Secondary | ICD-10-CM | POA: Diagnosis present

## 2022-10-21 DIAGNOSIS — G40909 Epilepsy, unspecified, not intractable, without status epilepticus: Secondary | ICD-10-CM

## 2022-10-21 DIAGNOSIS — S93401A Sprain of unspecified ligament of right ankle, initial encounter: Secondary | ICD-10-CM | POA: Diagnosis present

## 2022-10-21 DIAGNOSIS — Z9102 Food additives allergy status: Secondary | ICD-10-CM | POA: Diagnosis not present

## 2022-10-21 DIAGNOSIS — Z87820 Personal history of traumatic brain injury: Secondary | ICD-10-CM

## 2022-10-21 DIAGNOSIS — Z9989 Dependence on other enabling machines and devices: Secondary | ICD-10-CM

## 2022-10-21 DIAGNOSIS — K5900 Constipation, unspecified: Secondary | ICD-10-CM | POA: Diagnosis not present

## 2022-10-21 DIAGNOSIS — R278 Other lack of coordination: Secondary | ICD-10-CM | POA: Diagnosis not present

## 2022-10-21 DIAGNOSIS — I10 Essential (primary) hypertension: Secondary | ICD-10-CM | POA: Diagnosis not present

## 2022-10-21 DIAGNOSIS — M7730 Calcaneal spur, unspecified foot: Secondary | ICD-10-CM | POA: Diagnosis present

## 2022-10-21 DIAGNOSIS — Z6841 Body Mass Index (BMI) 40.0 and over, adult: Secondary | ICD-10-CM

## 2022-10-21 DIAGNOSIS — E785 Hyperlipidemia, unspecified: Secondary | ICD-10-CM | POA: Diagnosis present

## 2022-10-21 DIAGNOSIS — R569 Unspecified convulsions: Secondary | ICD-10-CM

## 2022-10-21 DIAGNOSIS — Z043 Encounter for examination and observation following other accident: Secondary | ICD-10-CM | POA: Diagnosis not present

## 2022-10-21 DIAGNOSIS — R262 Difficulty in walking, not elsewhere classified: Secondary | ICD-10-CM | POA: Diagnosis not present

## 2022-10-21 DIAGNOSIS — G40919 Epilepsy, unspecified, intractable, without status epilepticus: Secondary | ICD-10-CM | POA: Diagnosis present

## 2022-10-21 DIAGNOSIS — Z800 Family history of malignant neoplasm of digestive organs: Secondary | ICD-10-CM

## 2022-10-21 DIAGNOSIS — E039 Hypothyroidism, unspecified: Secondary | ICD-10-CM | POA: Diagnosis present

## 2022-10-21 DIAGNOSIS — L304 Erythema intertrigo: Secondary | ICD-10-CM | POA: Diagnosis not present

## 2022-10-21 DIAGNOSIS — M6281 Muscle weakness (generalized): Secondary | ICD-10-CM | POA: Diagnosis not present

## 2022-10-21 DIAGNOSIS — Z79899 Other long term (current) drug therapy: Secondary | ICD-10-CM | POA: Diagnosis not present

## 2022-10-21 DIAGNOSIS — M7731 Calcaneal spur, right foot: Secondary | ICD-10-CM | POA: Diagnosis not present

## 2022-10-21 DIAGNOSIS — M7989 Other specified soft tissue disorders: Secondary | ICD-10-CM | POA: Diagnosis not present

## 2022-10-21 DIAGNOSIS — M722 Plantar fascial fibromatosis: Secondary | ICD-10-CM | POA: Diagnosis not present

## 2022-10-21 DIAGNOSIS — W109XXA Fall (on) (from) unspecified stairs and steps, initial encounter: Secondary | ICD-10-CM | POA: Diagnosis present

## 2022-10-21 DIAGNOSIS — Z87891 Personal history of nicotine dependence: Secondary | ICD-10-CM

## 2022-10-21 DIAGNOSIS — E119 Type 2 diabetes mellitus without complications: Secondary | ICD-10-CM | POA: Diagnosis present

## 2022-10-21 DIAGNOSIS — H02402 Unspecified ptosis of left eyelid: Secondary | ICD-10-CM | POA: Diagnosis present

## 2022-10-21 DIAGNOSIS — Z91018 Allergy to other foods: Secondary | ICD-10-CM

## 2022-10-21 DIAGNOSIS — Z8261 Family history of arthritis: Secondary | ICD-10-CM

## 2022-10-21 DIAGNOSIS — L218 Other seborrheic dermatitis: Secondary | ICD-10-CM | POA: Diagnosis not present

## 2022-10-21 DIAGNOSIS — R561 Post traumatic seizures: Secondary | ICD-10-CM | POA: Diagnosis not present

## 2022-10-21 DIAGNOSIS — Z8051 Family history of malignant neoplasm of kidney: Secondary | ICD-10-CM

## 2022-10-21 DIAGNOSIS — Z791 Long term (current) use of non-steroidal anti-inflammatories (NSAID): Secondary | ICD-10-CM | POA: Diagnosis not present

## 2022-10-21 DIAGNOSIS — R6889 Other general symptoms and signs: Secondary | ICD-10-CM | POA: Diagnosis not present

## 2022-10-21 DIAGNOSIS — M25559 Pain in unspecified hip: Secondary | ICD-10-CM | POA: Diagnosis present

## 2022-10-21 DIAGNOSIS — Z801 Family history of malignant neoplasm of trachea, bronchus and lung: Secondary | ICD-10-CM

## 2022-10-21 DIAGNOSIS — M25571 Pain in right ankle and joints of right foot: Secondary | ICD-10-CM | POA: Diagnosis not present

## 2022-10-21 DIAGNOSIS — R279 Unspecified lack of coordination: Secondary | ICD-10-CM | POA: Diagnosis not present

## 2022-10-21 DIAGNOSIS — M19071 Primary osteoarthritis, right ankle and foot: Secondary | ICD-10-CM | POA: Diagnosis not present

## 2022-10-21 DIAGNOSIS — M6259 Muscle wasting and atrophy, not elsewhere classified, multiple sites: Secondary | ICD-10-CM | POA: Diagnosis not present

## 2022-10-21 DIAGNOSIS — Z720 Tobacco use: Secondary | ICD-10-CM | POA: Diagnosis present

## 2022-10-21 LAB — MAGNESIUM: Magnesium: 2.3 mg/dL (ref 1.7–2.4)

## 2022-10-21 LAB — BASIC METABOLIC PANEL
Anion gap: 6 (ref 5–15)
BUN: 16 mg/dL (ref 6–20)
CO2: 23 mmol/L (ref 22–32)
Calcium: 8.3 mg/dL — ABNORMAL LOW (ref 8.9–10.3)
Chloride: 106 mmol/L (ref 98–111)
Creatinine, Ser: 0.64 mg/dL (ref 0.61–1.24)
GFR, Estimated: >60 mL/min (ref >60)
Glucose, Bld: 90 mg/dL (ref 70–99)
Potassium: 4.1 mmol/L (ref 3.5–5.1)
Sodium: 135 mmol/L (ref 135–145)

## 2022-10-21 LAB — HEPATIC FUNCTION PANEL
ALT: 21 U/L (ref 0–44)
AST: 16 U/L (ref 15–41)
Albumin: 3.3 g/dL — ABNORMAL LOW (ref 3.5–5.0)
Alkaline Phosphatase: 77 U/L (ref 38–126)
Bilirubin, Direct: 0.2 mg/dL (ref 0.0–0.2)
Indirect Bilirubin: 0.5 mg/dL (ref 0.3–0.9)
Total Bilirubin: 0.7 mg/dL (ref 0.3–1.2)
Total Protein: 6.4 g/dL — ABNORMAL LOW (ref 6.5–8.1)

## 2022-10-21 LAB — CARBAMAZEPINE LEVEL, TOTAL: Carbamazepine Lvl: <2 ug/mL — ABNORMAL LOW (ref 4.0–12.0)

## 2022-10-21 LAB — CBG MONITORING, ED: Glucose-Capillary: 90 mg/dL (ref 70–99)

## 2022-10-21 MED ORDER — NICOTINE POLACRILEX 2 MG MT GUM: 2.0000 mg | CHEWING_GUM | OROMUCOSAL | Status: AC | PRN

## 2022-10-21 MED ORDER — LORAZEPAM 2 MG/ML IJ SOLN
INTRAMUSCULAR | Status: AC
  Filled 2022-10-21: qty 1

## 2022-10-21 MED ORDER — CLOBAZAM 10 MG PO TABS
20.0000 mg | ORAL_TABLET | Freq: Every day | ORAL | Status: DC
  Administered 2022-10-21: 20 mg via ORAL
  Filled 2022-10-21: qty 2
  Filled 2022-10-21: qty 4

## 2022-10-21 MED ORDER — SENNOSIDES-DOCUSATE SODIUM 8.6-50 MG PO TABS: 1.0000 | ORAL_TABLET | Freq: Every evening | ORAL | Status: DC | PRN

## 2022-10-21 MED ORDER — LORAZEPAM 2 MG/ML IJ SOLN
2.0000 mg | INTRAMUSCULAR | Status: DC | PRN
  Filled 2022-10-21: qty 1

## 2022-10-21 MED ORDER — TRIAMCINOLONE ACETONIDE 0.1 % EX CREA: TOPICAL_CREAM | Freq: Two times a day (BID) | CUTANEOUS | Status: DC | PRN

## 2022-10-21 MED ORDER — LEVETIRACETAM IN NACL 1000 MG/100ML IV SOLN
1000.0000 mg | Freq: Once | INTRAVENOUS | Status: AC
  Administered 2022-10-21: 1000 mg via INTRAVENOUS
  Filled 2022-10-21: qty 100

## 2022-10-21 MED ORDER — LORAZEPAM 2 MG/ML IJ SOLN
2.0000 mg | Freq: Once | INTRAMUSCULAR | Status: AC
  Administered 2022-10-21: 2 mg via INTRAVENOUS

## 2022-10-21 MED ORDER — BISACODYL 5 MG PO TBEC
5.0000 mg | DELAYED_RELEASE_TABLET | Freq: Every day | ORAL | Status: DC | PRN
  Administered 2022-10-26: 5 mg via ORAL
  Filled 2022-10-21: qty 1

## 2022-10-21 MED ORDER — MORPHINE SULFATE (PF) 4 MG/ML IV SOLN: 4.0000 mg | INTRAVENOUS | Status: DC | PRN

## 2022-10-21 MED ORDER — VITAMIN B-6 100 MG PO TABS
200.0000 mg | ORAL_TABLET | Freq: Every day | ORAL | Status: DC
  Administered 2022-10-22 – 2022-11-01 (×10): 200 mg via ORAL
  Filled 2022-10-21 (×12): qty 2

## 2022-10-21 MED ORDER — NICOTINE 21 MG/24HR TD PT24: 21.0000 mg | MEDICATED_PATCH | Freq: Every day | TRANSDERMAL | Status: AC | PRN

## 2022-10-21 MED ORDER — KETOROLAC TROMETHAMINE 15 MG/ML IJ SOLN
15.0000 mg | Freq: Four times a day (QID) | INTRAMUSCULAR | Status: AC | PRN
  Administered 2022-10-22: 15 mg via INTRAVENOUS
  Filled 2022-10-21 (×2): qty 1

## 2022-10-21 MED ORDER — KETOCONAZOLE 2 % EX CREA: TOPICAL_CREAM | Freq: Every day | CUTANEOUS | Status: DC | PRN

## 2022-10-21 MED ORDER — ACETAMINOPHEN 650 MG RE SUPP: 650.0000 mg | Freq: Four times a day (QID) | RECTAL | Status: AC | PRN

## 2022-10-21 MED ORDER — MORPHINE SULFATE (PF) 4 MG/ML IV SOLN
4.0000 mg | Freq: Once | INTRAVENOUS | Status: AC
  Administered 2022-10-21: 4 mg via INTRAVENOUS
  Filled 2022-10-21: qty 1

## 2022-10-21 MED ORDER — OXCARBAZEPINE 300 MG PO TABS
1200.0000 mg | ORAL_TABLET | Freq: Two times a day (BID) | ORAL | Status: DC
  Administered 2022-10-21 – 2022-11-01 (×22): 1200 mg via ORAL
  Filled 2022-10-21 (×23): qty 4

## 2022-10-21 MED ORDER — MORPHINE SULFATE (PF) 4 MG/ML IV SOLN
4.0000 mg | INTRAVENOUS | Status: AC | PRN
  Administered 2022-10-21 – 2022-10-22 (×4): 4 mg via INTRAVENOUS
  Filled 2022-10-21 (×4): qty 1

## 2022-10-21 MED ORDER — ONDANSETRON HCL 4 MG/2ML IJ SOLN
4.0000 mg | Freq: Four times a day (QID) | INTRAMUSCULAR | Status: AC | PRN
  Administered 2022-10-27 (×2): 4 mg via INTRAVENOUS
  Filled 2022-10-21 (×2): qty 2

## 2022-10-21 MED ORDER — ONDANSETRON HCL 4 MG PO TABS
4.0000 mg | ORAL_TABLET | Freq: Four times a day (QID) | ORAL | Status: AC | PRN
  Administered 2022-10-28: 4 mg via ORAL
  Filled 2022-10-21: qty 1

## 2022-10-21 MED ORDER — HEPARIN SODIUM (PORCINE) 5000 UNIT/ML IJ SOLN
5000.0000 [IU] | Freq: Three times a day (TID) | INTRAMUSCULAR | Status: DC
  Administered 2022-10-22 – 2022-11-01 (×31): 5000 [IU] via SUBCUTANEOUS
  Filled 2022-10-21 (×30): qty 1

## 2022-10-21 MED ORDER — ACETAMINOPHEN 325 MG PO TABS
650.0000 mg | ORAL_TABLET | Freq: Four times a day (QID) | ORAL | Status: AC | PRN
  Administered 2022-10-22: 650 mg via ORAL
  Filled 2022-10-21: qty 2

## 2022-10-21 MED ORDER — FENTANYL CITRATE PF 50 MCG/ML IJ SOSY
50.0000 ug | PREFILLED_SYRINGE | INTRAMUSCULAR | Status: AC | PRN
  Administered 2022-10-21 – 2022-10-22 (×2): 50 ug via INTRAVENOUS
  Filled 2022-10-21 (×2): qty 1

## 2022-10-21 MED ORDER — HYDRALAZINE HCL 20 MG/ML IJ SOLN: 5.0000 mg | Freq: Four times a day (QID) | INTRAMUSCULAR | Status: AC | PRN

## 2022-10-21 MED ORDER — ATORVASTATIN CALCIUM 20 MG PO TABS
20.0000 mg | ORAL_TABLET | Freq: Every day | ORAL | Status: DC
  Administered 2022-10-22 – 2022-11-01 (×11): 20 mg via ORAL
  Filled 2022-10-21 (×11): qty 1

## 2022-10-21 MED ORDER — MELOXICAM 7.5 MG PO TABS
15.0000 mg | ORAL_TABLET | Freq: Every day | ORAL | Status: DC
  Administered 2022-10-22 – 2022-10-24 (×3): 15 mg via ORAL
  Filled 2022-10-21 (×3): qty 2

## 2022-10-21 MED ORDER — LEVETIRACETAM 500 MG PO TABS
1500.0000 mg | ORAL_TABLET | Freq: Two times a day (BID) | ORAL | Status: DC
  Administered 2022-10-21 – 2022-11-01 (×22): 1500 mg via ORAL
  Filled 2022-10-21 (×22): qty 3

## 2022-10-21 NOTE — ED Notes (Signed)
Lab called about CBC being in process for extended period of time. Lab stated that pt's specimen was not there.

## 2022-10-21 NOTE — ED Notes (Signed)
Inpt MD at bedside

## 2022-10-21 NOTE — ED Notes (Signed)
Ice bag placed on ankle

## 2022-10-21 NOTE — ED Triage Notes (Signed)
pt in via [facility] with complaints of ankle pain and seizure activity. Pt has weekly seizures and hx of seizures since 2024. Pt was going up the stairs and pt reports that he felt his ankle pop and he fell which caused the seizure. Pt states that stress and pain cause the seizures.Per EMS pt had 2 seizures in route, one full body and one focal seizure.  Pt does not know whether he hit his head. Vagal nerve stimulator in place since July 2023.   EMS  2.5 IV versed given  BP 150/80 CBG 95

## 2022-10-21 NOTE — Assessment & Plan Note (Signed)
Home meloxicam 15 mg daily resumed

## 2022-10-21 NOTE — Assessment & Plan Note (Signed)
Home atorvastatin 20 mg daily resumed

## 2022-10-21 NOTE — ED Notes (Signed)
Lab at bedside to re-collect CBC

## 2022-10-21 NOTE — Assessment & Plan Note (Signed)
-   This complicates overall care and prognosis.  

## 2022-10-21 NOTE — ED Notes (Signed)
Phlebotomist [place] asked to draw labs

## 2022-10-21 NOTE — ED Provider Notes (Signed)
Care of this patient assumed from prior physician at 1500 pending imaging, reevaluation, and disposition. Please see prior physician note for further details.  Briefly this is a 52 year old male with seizure disorder with known trigger of pain presenting after an ankle injury with multiple subsequent short-lived seizure-like activity.  Case reviewed with neurology, did recommend pain control, did not feel presentation was consistent with status epilepticus.  Signed out to me pending completion of labs, imaging, and disposition.  Foot and ankle x-rays with soft tissue swelling without acute fracture.  Radiology notes findings possibly suggestive of plantar fasciitis.  CT head without acute bleed.  Patient reevaluated and updated on the results of his workup.  I did discuss the possibility of discharge given return to baseline mental status and reassuring x-Amila Callies.  Patient expressed significant concern with this plan stating that he is unable to bear weight without having recurrent seizure activity and the multiple seizures he had today are more frequent than he has ever had previously.  Given his increase seizure frequency, do not think admission is unreasonable.  Had previously been discussed with Dr. Wilford Corner with neurology who reports that he can consult.  Do not feel patient has indication for emergent orthopedic consult currently with negative x-Kaelei Wheeler, but will place him in a short leg splint here.  Will reach out to hospitalist team to discuss admission.  Case reviewed with Dr. Sedalia Muta.  She will evaluate the patient for anticipated admission.   Trinna Post, MD 10/21/22 [phone]

## 2022-10-21 NOTE — Assessment & Plan Note (Signed)
Seizure, fall, aspiration precaution Status post Keppra 1000 mg IV in the emergency department Home clobazam 20 mg nightly, Keppra 1500 mg p.o. twice daily, oxcarbazepine 1200 mg p.o. twice daily were resumed on admission Ativan 2 mg IV as needed, 2 doses ordered with instruction to administer for breakthrough seizures and then let provider know

## 2022-10-21 NOTE — Hospital Course (Signed)
Mr. Shabaz Crouse, Montez Hageman. 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  Vitals in the ED showed temperature of 98.9, respiration rate of 18, heart rate of 84, blood pressure 135/67, SpO2 of 98% on room air.  Serum sodium is 135, potassium 4.1, chloride 106, bicarb 23, BUN of 16, serum creatinine 0.64, EGFR greater than 60, nonfasting blood glucose of 90, Keppra/levetiracetam level is pending, magnesium level was 2.3.  Carbamazepine level is less than 2.0.  CT head wo contrast: Read as stable head CT, no acute intracranial process  Right ankle x-ray: Mild soft tissue swelling about the imaged lower leg, ankle and hindfoot without associated acute fracture or dislocation.  Small plantar calcaneal spur with dystrophic calcification overlying the expected location of the posterior aspect of the plantar fascia, nonspecific though can be seen in setting of plantar fasciitis.  Right foot x-ray: The above in addition to pes cavus deformity is suspected on this nonweightbearing radiograph.  EDP consulted neurology, Dr. Wilford Corner.  ED treatment: Ativan 2 mg IV, morphine 4 mg IV, Keppra 1000 mg IV.

## 2022-10-21 NOTE — Assessment & Plan Note (Addendum)
Unable to bear weight PT, OT consulted to attempt tomorrow Patient requesting orthopedic evaluation as he suspects that he ruptured a tendon and he would like to discuss his options with an orthopedic provider as he does not live in [place] full-time and is not establish with an orthopedic doctor Orthopedic staff message sent to Dr. Ike Bene MRI of the right foot not ordered on admission due to history of vagus nerve stimulator implantation

## 2022-10-21 NOTE — Assessment & Plan Note (Signed)
Hydralazine 5 mg IV every 6 hours as needed for SBP greater 170, 7 days ordered

## 2022-10-21 NOTE — ED Notes (Signed)
CBG:90

## 2022-10-21 NOTE — ED Notes (Signed)
Pt visitor to nurses station with concern pt is having another seizure. RN to bedside at this time. RN observed pt laying holding the bed and ED techs placing splint on pt at this time. Pt blinking and expressing pain in ankle when EDT moves foot to place splint. MD made aware.

## 2022-10-21 NOTE — Assessment & Plan Note (Signed)
PDMP reviewed Acetaminophen 650 mg p.o./rectal every 6 hours as needed for mild pain, fever; Toradol 50 mg IV every 6 hours as needed for moderate pain, 1 day ordered; morphine 4 mg IV every 4 hours as needed for severe pain, 19 hours ordered Fentanyl 50 mcg IV every 4 hours as needed for breakthrough pain not responsive to IV Toradol or IV morphine, 20 hours of coverage ordered AM team to reevaluate patient at bedside for continued IV opioid pain requirements

## 2022-10-21 NOTE — Assessment & Plan Note (Signed)
As needed nicotine patch/nicotine gum ordered

## 2022-10-21 NOTE — Assessment & Plan Note (Signed)
-  CPAP nightly ordered 

## 2022-10-21 NOTE — ED Notes (Signed)
Upon this RN entering room, pt alert. No seizure activity at this time. ED tech in room states that he just stopped having seizure activity. Pt speaking in phrases. A&Ox4.

## 2022-10-21 NOTE — ED Provider Notes (Signed)
[facility] Provider Note    Event Date/Time   First MD Initiated Contact with Patient 10/21/22 1258     (approximate)   History   Ankle Pain and Seizures   HPI  Randy Cordova. is a 52 y.o. 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  He states that he has seizures 2-3 times a month and they are typically triggered by pain.  He is not concerned about his seizures, but instead of his ankle injury  He states that he is fully compliant with his antiepileptic medications.  He has otherwise been in his regular state of health with no recent illnesses.  He is unsure whether he hit his head after he rolled his ankle and had a seizure but denies any headache.  He has pain to the lateral side of his ankle on the right side.   Independent Historian contributed to assessment above: EMS reports normal blood sugar en route, with a couple of witnessed very short less than 1 minute seizure activity with shaking of bilateral arms rolling back of eyes but was quickly awake alert afterwards with no significant postictal confusion periods afterwards.  They gave a total of 2.5 Versed IV.  External Medical Documents Reviewed: Neurology note from August 29 documenting his history of seizures, EEG reports, and placement of vagus nerve stimulation device      Physical Exam   Triage Vital Signs: ED Triage Vitals  Encounter Vitals Group     BP      Systolic BP Percentile      Diastolic BP Percentile      Pulse      Resp      Temp      Temp src      SpO2      Weight      Height      Head Circumference      Peak Flow      Pain Score      Pain Loc      Pain Education      Exclude from Growth Chart     Most recent vital signs: Vitals:    10/21/22 1314  BP: 135/67  Pulse: 84  Resp: 18  Temp: 98.9 F (37.2 C)  SpO2: 98%    General: Awake, no distress.  CV:  Good peripheral perfusion.  Resp:  Normal effort.  Abd:  No distention.  Other:  Awake alert comfortable conversant gentleman in no acute distress.  He is able to tell me fully about his past medical history of seizure disorder frequency of seizures and is oriented.  He has some swelling to the lateral side of the ankle underneath and surrounding the lateral malleolus with some mild tenderness palpation along the lateral side of the foot as well.  He is neurovascular intact able to range and he has good strong pedal pulses.  There is no obvious evidence of head injury.  He is able to range all other extremities full active range of motion is a soft nontender abdomen and no tenderness to palpation around the chest wall.   ED Results / Procedures / Treatments   Labs (all labs ordered are listed, but only abnormal results are displayed) Labs Reviewed  BASIC METABOLIC PANEL  MAGNESIUM  CBC WITH DIFFERENTIAL/PLATELET  LEVETIRACETAM LEVEL  CARBAMAZEPINE LEVEL, TOTAL  CBG MONITORING, ED     I ordered and reviewed the above labs they are notable for normal blood glucose at bedside performed  EKG  ED ECG REPORT I, Pilar Jarvis, the attending physician, personally viewed and interpreted this ECG.   Date: 10/21/2022  EKG Time: 1300  Rate: 87  Rhythm: nsr  Axis: nl  Intervals:none  ST&T Change: no stemi    RADIOLOGY I independently reviewed and interpreted ankle x-ray and I see no obvious fractures or dislocation I also reviewed radiologist's formal read.   PROCEDURES:  Critical Care performed: No  Procedures   MEDICATIONS ORDERED IN ED: Medications  levETIRAcetam (KEPPRA) IVPB 1000 mg/100 mL premix (has no administration in time range)  morphine (PF) 4 MG/ML injection 4 mg (4 mg Intravenous Given 10/21/22 1322)  LORazepam (ATIVAN) injection 2 mg (2 mg  Intravenous Given 10/21/22 1319)     IMPRESSION / MDM / ASSESSMENT AND PLAN / ED COURSE  I reviewed the triage vital signs and the nursing notes.                                Patient's presentation is most consistent with acute presentation with potential threat to life or bodily function.  Differential diagnosis includes, but is not limited to, seizures, breakthrough seizures due to pain trigger or medication noncompliance, blunt traumatic injury including head strike leading to ICH, skull fracture, ankle fracture or foot fracture dislocation.   The patient is on the cardiac monitor to evaluate for evidence of arrhythmia and/or significant heart rate changes.  MDM:    Patient with a history of seizure disorder triggered by pain here with an ankle injury that triggered multiple short-lived seizures.  Got Versed by EMS and no further seizure activity since then.  Awake alert oriented with only chief complaint of twisted ankle pain.  Will get x-rays of the affected foot and ankle.  Obtain CT scan of the head to rule out ICH due to unknown head strike and multiple seizures after his fall.  Check basic labs, electrolytes for electrolyte derangements that may lead to lower seizure threshold, and check Keppra level.  I doubt subtherapeutic levels are because due to his strict compliance with his medications reported by patient.  I considered status epilepticus but I think unlikely at this time given his awake alert oriented status and very short-lived seizure with complete return to baseline movements afterwards.  He had another witnessed short-lived seizure lasting approximately 20 seconds with bilateral shaking of the hands and immediately oriented and awake afterwards, so I gave 2 mg of IV Ativan.  Will try to control pain better with some IV morphine and ice to the area.  --- I spoke with Dr. Wilford Corner of neurology who reviewed past medical history and agree with ongoing pain control, but  clinical symptomatology not consistent with status epilepticus given completely back to baseline currently and short-lived seizure activity.  If admitted, can consult neurology for further antiepileptic medication review, adjustments as needed.  The plan at signout to oncoming provider would be for continuing pain control, observation for ongoing seizure activity, follow-up on trauma imaging, and disposition.     FINAL CLINICAL IMPRESSION(S) / ED DIAGNOSES   Final diagnoses:  Acute right ankle pain  Seizures (HCC)     Rx / DC Orders   ED Discharge Orders  None        Note:  This document was prepared using Dragon voice recognition software and may include unintentional dictation errors.    Pilar Jarvis, MD 10/21/22 [id number]

## 2022-10-21 NOTE — Assessment & Plan Note (Signed)
Home meloxicam resumed

## 2022-10-21 NOTE — H&P (Signed)
History and Physical   Randy Cordova. YQM:[id number] DOB: 28-Sep-1970 DOA: 10/21/2022  PCP: Eden Emms, NP  Outpatient Specialists: Dr. Glena Norfolk, [facility] Patient coming from: Home  I have personally briefly reviewed patient's old medical records in [facility] EMR.  Chief Concern: Right foot pain and seizures  HPI: [name], Basta. 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  Vitals in the ED showed temperature of 98.9, respiration rate of 18, heart rate of 84, blood pressure 135/67, SpO2 of 98% on room air.  Serum sodium is 135, potassium 4.1, chloride 106, bicarb 23, BUN of 16, serum creatinine 0.64, EGFR greater than 60, nonfasting blood glucose of 90, Keppra/levetiracetam level is pending, magnesium level was 2.3.  Carbamazepine level is less than 2.0.  CT head wo contrast: Read as stable head CT, no acute intracranial process  Right ankle x-ray: Mild soft tissue swelling about the imaged lower leg, ankle and hindfoot without associated acute fracture or dislocation.  Small plantar calcaneal spur with dystrophic calcification overlying the expected location of the posterior aspect of the plantar fascia, nonspecific though can be seen in setting of plantar fasciitis.  Right foot x-ray: The above in addition to pes cavus deformity is suspected on this nonweightbearing radiograph.  EDP consulted neurology, Dr. Wilford Corner.  ED treatment: Ativan 2 mg IV, morphine 4 mg IV, Keppra 1000 mg IV. ----------------------------------- At bedside, he is able to tell me his name, age, current location, current calendar year.  He was walking upstairs and he was 2 steps away when he heard as snap/pop in the next  thing he knew he was at the top of the stairs with a male neighbor hovering over him.  Per the male neighbor, patient mated to the top of the stairs and had a seizure.  EMS was called.  At bedside, patient reports that there is no report of incontinence of urinary or bowel.  Patient denies chest pain, shortness of breath, abdominal pain, dysuria, hematuria, diarrhea.  He denies fever, nausea, vomiting.  He endorses chills since being in the emergency department.  He reports he is hungry.  He reports he is unable to ambulate and that his right foot still hurts.  Social history: Patient lives at home with his wife in [place].  He still receives medical care in the [place] of [place].  He flies back to [place] annually for his checkup and neurology appointments.  He stays with his best friend when he is in [place].  He chews nicotine gum. He denies recreational drug use.  He occasionally drinks a pint when going out in [place].  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  ROS: Constitutional: no weight change, no fever ENT/Mouth: no sore throat, no rhinorrhea Eyes: no eye pain, no vision changes Cardiovascular: no chest pain, no dyspnea,  no edema, no palpitations Respiratory: no cough, no sputum, no wheezing Gastrointestinal: no nausea, no vomiting, no diarrhea, no constipation Genitourinary: no urinary incontinence, no dysuria, no hematuria Musculoskeletal: no arthralgias, no myalgias, + right foot/leg pain Skin: no skin lesions, no pruritus, Neuro: + weakness, no loss of consciousness, no syncope Psych: no anxiety, no depression, + decrease appetite Heme/Lymph: no bruising, no bleeding  ED Course: Discussed with emergency medicine provider,  patient requiring hospitalization for chief concerns of pain control and multiple breakthrough seizures.  Assessment/Plan  Principal Problem:   Inadequate pain control Active Problems:   Breakthrough seizures, recurrent (HCC)   OSA on  CPAP   Epilepsy (HCC)   Morbid obesity with BMI of 60.0-69.9, adult (HCC)   Tobacco user   Primary hypertension   Osteoarthritis of both knees   Nonalcoholic fatty liver disease   Hyperlipidemia   History of traumatic brain injury   Generalized anxiety disorder   Arthralgia of hip   Foot pain, right   Assessment and Plan:  * Inadequate pain control PDMP reviewed Acetaminophen 650 mg p.o./rectal every 6 hours as needed for mild pain, fever; Toradol 50 mg IV every 6 hours as needed for moderate pain, 1 day ordered; morphine 4 mg IV every 4 hours as needed for severe pain, 19 hours ordered Fentanyl 50 mcg IV every 4 hours as needed for breakthrough pain not responsive to IV Toradol or IV morphine, 20 hours of coverage ordered AM team to reevaluate patient at bedside for continued IV opioid pain requirements  Breakthrough seizures, recurrent (HCC) Seizure, fall, aspiration precaution Status post Keppra 1000 mg IV in the emergency department Home clobazam 20 mg nightly, Keppra 1500 mg p.o. twice daily, oxcarbazepine 1200 mg p.o. twice daily were resumed on admission Ativan 2 mg IV as needed, 2 doses ordered with instruction to administer for breakthrough seizures and then let provider know  Foot pain, right Unable to bear weight PT, OT consulted to attempt tomorrow Patient requesting orthopedic evaluation as he suspects that he ruptured a tendon and he would like to discuss his options with an orthopedic provider as he does not live in [place] full-time and is not establish with an orthopedic doctor Orthopedic staff message sent to Dr. Ike Bene MRI of the right foot not ordered on admission due to history of vagus nerve stimulator implantation  Arthralgia of hip Home meloxicam 15 mg daily resumed  Hyperlipidemia Home atorvastatin 20 mg daily resumed  Osteoarthritis of both knees Home meloxicam resumed  Primary hypertension Hydralazine 5 mg IV every 6 hours as needed for SBP  greater 170, 7 days ordered  Tobacco user As needed nicotine patch/nicotine gum ordered  Morbid obesity with BMI of 60.0-69.9, adult (HCC) This complicates overall care and prognosis.   OSA on CPAP CPAP nightly ordered  Chart reviewed.   DVT prophylaxis: Heparin 5000 units subcutaneous every 8 hours Code Status: Full code Diet: Heart healthy Family Communication: Updated patient's best friend, Gregary Signs at bedside with patient's permission Disposition Plan: Pending clinical course Consults called: EDP consulted neurology service; orthopedic service has been consulted via staff message Admission status: Telemetry medical, inpatient  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   Arthritis 2020   Epilepsy (HCC)    Generalized anxiety disorder 12/23/2020   Hyperlipidemia 12/23/2020   Hypothyroidism 10/29/2010   OSA on CPAP    Primary hypertension 12/23/2020   Sleep apnea 1997   On CPAP   TBI (traumatic brain injury) (HCC)    Thrombocytopenia (HCC)    Type 2 diabetes mellitus (HCC) 12/23/2020   Past Surgical History:  Procedure Laterality Date   BACK SURGERY     BRAIN SURGERY  02/27/2021   SEEG   CHOLECYSTECTOMY     FRACTURE SURGERY  Numerous   1985 - 2016   Head surgery     Due to brain injury   KNEE SURGERY  SPINE SURGERY  1997   Social History:  reports that he quit smoking about 3 years ago. His smoking use included cigarettes, cigars, and e-cigarettes. He started smoking about 38 years ago. He has a 35 pack-year smoking history. He has never used smokeless tobacco. He reports that he does not currently use alcohol. He reports that he does not use drugs.  Allergies  Allergen Reactions   Aspartame And Phenylalanine     Headache   Family History  Problem Relation Age of Onset   Arthritis/Rheumatoid Mother    Arthritis Mother    Obesity Mother    Cancer Maternal Grandmother        lung cancer   Obesity Maternal Aunt    Alcohol abuse  Maternal Uncle    Renal cancer Maternal Uncle    Cancer Maternal Uncle        lung, colon cancer   Early death Maternal Uncle    Family history: Family history reviewed and not pertinent.  Prior to Admission medications   Medication Sig Start Date End Date Taking? Authorizing Provider  acetaminophen (TYLENOL) 325 MG tablet Take by mouth.   Yes [provider]  atorvastatin (LIPITOR) 20 MG tablet TAKE 1 TABLET BY MOUTH DAILY 09/11/22  Yes Eden Emms, NP  cloBAZam (ONFI) 20 MG tablet Take 1 tablet by mouth at bedtime. 10/10/20  Yes [provider]  hydrocortisone 2.5 % lotion Apply topically. 10/21/22  Yes [provider]  ketoconazole (NIZORAL) 2 % cream Apply topically daily. 12/21/21  Yes Eden Emms, NP  levETIRAcetam (KEPPRA) 750 MG tablet Take 2 tablets (1,500 mg total) by mouth 2 (two) times daily. 04/23/20  Yes Danford, Earl Lites, MD  meloxicam (MOBIC) 15 MG tablet Take 1 tablet (15 mg total) by mouth daily. 10/03/22  Yes Eden Emms, NP  Midazolam (NAYZILAM) 5 MG/0.1ML SOLN Place into the nose. 03/07/22  Yes [provider]  oxcarbazepine (TRILEPTAL) 600 MG tablet Take 2 tablets by mouth in the morning and at bedtime. 08/17/20 10/21/22 Yes [provider]  pyridOXINE (B-6) 200 MG tablet Take 1 tablet (200 mg total) by mouth daily. 04/24/20  Yes Danford, Earl Lites, MD  triamcinolone cream (KENALOG) 0.1 % Apply topically. 10/21/22  Yes [provider]   Physical Exam: Vitals:   10/21/22 1600 10/21/22 1910 10/21/22 1930 10/21/22 2000  BP: (!) 151/72 120/87 137/74 (!) 143/64  Pulse: 72 79 81 77  Resp: 11 16 (!) 24 17  Temp:      TempSrc:      SpO2: 99% 96% 94% 97%  Weight:      Height:       Constitutional: appears older than chronological age, chronically ill, calm Eyes: PERRL, lids and conjunctivae normal ENMT: Mucous membranes are moist. Posterior pharynx clear of any exudate or lesions. Age-appropriate dentition.  Hearing appropriate Neck: normal, supple, no masses, no thyromegaly Respiratory: clear to auscultation bilaterally, no wheezing, no crackles. Normal respiratory effort. No accessory muscle use.  Gastric sounds in the left side of his chest, may be secondary to hiatal hernia Cardiovascular: Regular rate and rhythm, no murmurs / rubs / gallops. No extremity edema. 2+ pedal pulses. No carotid bruits.  Abdomen: Morbidly obese abdomen, no tenderness, no masses palpated, no hepatosplenomegaly. Bowel sounds positive.  Musculoskeletal: no clubbing / cyanosis. No joint deformity upper and lower extremities. Good ROM, no contractures, no atrophy. Normal muscle tone.  Splint in place in the right lower extremity.  Patient is able  to wiggle his toes bilaterally.  Decreased flexion of the right knee due to pain. Skin: no rashes, lesions, ulcers. No induration Neurologic: Sensation intact. Strength 5/5 in all 4.  Psychiatric: Normal judgment and insight. Alert and oriented x 3. Normal mood.   EKG: independently reviewed, showing sinus rhythm with rate of 87, QTc 454  Chest x-ray on Admission: I personally reviewed and I agree with radiologist reading as below.  CT Head Wo Contrast  Result Date: 10/21/2022 CLINICAL DATA:  Larey Seat, seizure EXAM: CT HEAD WITHOUT CONTRAST TECHNIQUE: Contiguous axial images were obtained from the base of the skull through the vertex without intravenous contrast. RADIATION DOSE REDUCTION: This exam was performed according to the departmental dose-optimization program which includes automated exposure control, adjustment of the mA and/or kV according to patient size and/or use of iterative reconstruction technique. COMPARISON:  12/04/2020 FINDINGS: Brain: No acute infarct or hemorrhage. Lateral ventricles and midline structures are unremarkable. No acute extra-axial fluid collections. No mass effect. Vascular: No hyperdense vessel or unexpected calcification. Skull: Normal. Negative for  fracture or focal lesion. Sinuses/Orbits: No acute finding. Other: None. IMPRESSION: 1. Stable head CT, no acute intracranial process. Electronically Signed   By: Sharlet Salina M.D.   On: 10/21/2022 16:21   DG Foot Complete Right  Result Date: 10/21/2022 CLINICAL DATA:  Post fall, now with lateral sided foot and ankle pain. EXAM: RIGHT FOOT COMPLETE - 3+ VIEW COMPARISON:  Ankle radiographs earlier same day FINDINGS: There is diffuse soft tissue swelling about the imaged lower leg, ankle and hindfoot. Note is made of an os peroneus. No definite displaced acute fracture. Mild degenerative change of several midfoot articulations. Joint spaces appear otherwise well preserved. No significant hallux valgus deformity. No erosions. Pes cavus deformity is suspected on this nonweightbearing radiographs. Small plantar calcaneal spur. Dystrophic calcifications involving the posterior aspect of the plantar fascia. Enthesopathic change involving the Achilles tendon insertion site. Dermal calcifications overlie the anterior aspect of the lower leg. No radiopaque foreign body. IMPRESSION: 1. Diffuse soft tissue swelling about the imaged lower leg, ankle and hindfoot without underlying displaced acute fracture or dislocation. 2. Pes cavus deformity is suspected on this nonweightbearing radiographs. 3. Small plantar calcaneal spur with dystrophic calcifications involving the posterior aspect of the plantar fascia, nonspecific though could be seen in the setting of plantar fasciitis. Electronically Signed   By: Simonne Come M.D.   On: 10/21/2022 15:06   DG Ankle Complete Right  Result Date: 10/21/2022 CLINICAL DATA:  Post fall, now with lateral sided foot and ankle pain. EXAM: RIGHT ANKLE - COMPLETE 3+ VIEW COMPARISON:  Right foot radiographs earlier same day FINDINGS: Mild soft tissue swelling about the imaged lower leg, ankle and hindfoot. Peripherally corticated ossicles adjacent to the medial malleolus likely represent the  sequela of remote avulsive injury. Note is made of a os peroneus. No acute fracture or dislocation. Joint spaces appear preserved. The ankle mortise appears preserved. No ankle joint effusion. Small plantar calcaneal spur with dystrophic calcification overlying expected location of the posterior aspect of the plantar fascia. Enthesopathic change involving the Achilles tendon insertion site. Dermal calcifications overlie the anterior aspect of the lower leg. IMPRESSION: 1. Mild soft tissue swelling about the imaged lower leg, ankle and hindfoot without associated acute fracture or dislocation. 2. Small plantar calcaneal spur with dystrophic calcification overlying expected location of the posterior aspect of the plantar fascia, nonspecific though could be seen in the setting of plantar fasciitis. Electronically Signed   By: Jonny Ruiz  Watts M.D.   On: 10/21/2022 15:04    Labs on Admission: I have personally reviewed following labs  CBC: No results for input(s): "WBC", "NEUTROABS", "HGB", "HCT", "MCV", "PLT" in the last 168 hours.  Basic Metabolic Panel: Recent Labs  Lab 10/21/22 1327  NA 135  K 4.1  CL 106  CO2 23  GLUCOSE 90  BUN 16  CREATININE 0.64  CALCIUM 8.3*  MG 2.3   GFR: Estimated Creatinine Clearance: 201.8 mL/min (by C-G formula based on SCr of 0.64 mg/dL).  BNP (last 3 results) Recent Labs    10/03/22 1525  PROBNP 34.0   HbA1C: No results for input(s): "HGBA1C" in the last 72 hours.  CBG: Recent Labs  Lab 10/21/22 1254  GLUCAP 90   Urine analysis:    Component Value Date/Time   COLORURINE YELLOW (A) 03/19/2020 1817   APPEARANCEUR HAZY (A) 03/19/2020 1817   LABSPEC 1.024 03/19/2020 1817   PHURINE 5.0 03/19/2020 1817   GLUCOSEU NEGATIVE 03/19/2020 1817   HGBUR NEGATIVE 03/19/2020 1817   BILIRUBINUR NEGATIVE 03/19/2020 1817   KETONESUR NEGATIVE 03/19/2020 1817   PROTEINUR NEGATIVE 03/19/2020 1817   NITRITE NEGATIVE 03/19/2020 1817   LEUKOCYTESUR NEGATIVE  03/19/2020 1817   This document was prepared using Dragon Voice Recognition software and may include unintentional dictation errors.  Dr. Sedalia Muta [facility]  If 7PM-7AM, please contact overnight-coverage provider If 7AM-7PM, please contact day attending provider [email or web address]  10/21/2022, 9:30 PM

## 2022-10-21 NOTE — ED Notes (Signed)
MD to bedside at this time

## 2022-10-21 NOTE — ED Notes (Signed)
This RN made aware by another RN that pt was seizing. Art therapist to bedside. This RN to EDP. Per EDP, if seizing lasts longer than 2 minutes, administer medications for seizure. RN verified with MD.

## 2022-10-21 NOTE — Assessment & Plan Note (Signed)
Home seizure medication resumed on admission per above

## 2022-10-21 NOTE — ED Notes (Signed)
RN at bedside when pt started exhibiting full body seizure-like activity. RN immediately notified MD Modesto Charon. MD to bedside. Once this RN and MD Modesto Charon returned to bedside, pt no longer exhibiting seizure like activity. MD gave this RN verbal order for 2mg  lorazepam at this time. See MAR.

## 2022-10-22 ENCOUNTER — Encounter: Payer: Self-pay | Admitting: Internal Medicine

## 2022-10-22 ENCOUNTER — Ambulatory Visit: Payer: Medicare Other

## 2022-10-22 ENCOUNTER — Inpatient Hospital Stay: Payer: Medicare Other

## 2022-10-22 DIAGNOSIS — M7989 Other specified soft tissue disorders: Secondary | ICD-10-CM | POA: Diagnosis not present

## 2022-10-22 DIAGNOSIS — R52 Pain, unspecified: Secondary | ICD-10-CM | POA: Diagnosis not present

## 2022-10-22 DIAGNOSIS — G40919 Epilepsy, unspecified, intractable, without status epilepticus: Secondary | ICD-10-CM | POA: Diagnosis not present

## 2022-10-22 DIAGNOSIS — M79671 Pain in right foot: Secondary | ICD-10-CM | POA: Diagnosis not present

## 2022-10-22 DIAGNOSIS — R569 Unspecified convulsions: Secondary | ICD-10-CM | POA: Diagnosis not present

## 2022-10-22 LAB — BASIC METABOLIC PANEL
Anion gap: 8 (ref 5–15)
BUN: 11 mg/dL (ref 6–20)
CO2: 24 mmol/L (ref 22–32)
Calcium: 8.3 mg/dL — ABNORMAL LOW (ref 8.9–10.3)
Chloride: 101 mmol/L (ref 98–111)
Creatinine, Ser: 0.71 mg/dL (ref 0.61–1.24)
GFR, Estimated: >60 mL/min (ref >60)
Glucose, Bld: 142 mg/dL — ABNORMAL HIGH (ref 70–99)
Potassium: 3.7 mmol/L (ref 3.5–5.1)
Sodium: 133 mmol/L — ABNORMAL LOW (ref 135–145)

## 2022-10-22 LAB — CBC
HCT: 39.3 % (ref 39.0–52.0)
Hemoglobin: 12.9 g/dL — ABNORMAL LOW (ref 13.0–17.0)
MCH: 29.3 pg (ref 26.0–34.0)
MCHC: 32.8 g/dL (ref 30.0–36.0)
MCV: 89.3 fL (ref 80.0–100.0)
Platelets: 97 10*3/uL — ABNORMAL LOW (ref 150–400)
RBC: 4.4 MIL/uL (ref 4.22–5.81)
RDW: 13.7 % (ref 11.5–15.5)
WBC: 3.8 10*3/uL — ABNORMAL LOW (ref 4.0–10.5)
nRBC: 0 % (ref 0.0–0.2)

## 2022-10-22 LAB — LEVETIRACETAM LEVEL: Levetiracetam Lvl: 19.3 ug/mL (ref 10.0–40.0)

## 2022-10-22 MED ORDER — CLOBAZAM 10 MG PO TABS: 20.0000 mg | ORAL_TABLET | Freq: Every day | ORAL | Status: DC

## 2022-10-22 MED ORDER — CLOBAZAM 2.5 MG/ML PO SUSP
20.0000 mg | Freq: Once | ORAL | Status: DC
  Administered 2022-10-22: 20 mg via ORAL
  Filled 2022-10-22: qty 8

## 2022-10-22 NOTE — Progress Notes (Signed)
Progress Note    Randy Cordova.  ZOX:[id number] DOB: 1970-05-28  DOA: 10/21/2022 PCP: Eden Emms, NP      Brief Narrative:    Medical records reviewed and are as summarized below:  Randy Cordova. is a 52 y.o. male with history of TBI's, history of seizures secondary to TBI's, status post vagal nerve stimulator placement, morbid obesity, OSA on CPAP, hypertension, hyperlipidemia, presented to the hospital because of severe right foot pain and seizures.  He said he fell down the stairs after twisting his ankle as he was walking up the stairs.  He said he had a seizure shortly after the fall and this was witnessed.  He said he had about 11 seizures yesterday.  It was also reported that he had a brief seizure-like activity in the emergency department.   He was admitted to the hospital for breakthrough seizures of the right foot pain.     Assessment/Plan:   Principal Problem:   Inadequate pain control Active Problems:   Breakthrough seizures, recurrent (HCC)   OSA on CPAP   Epilepsy (HCC)   Morbid obesity with BMI of 60.0-69.9, adult (HCC)   Tobacco user   Primary hypertension   Osteoarthritis of both knees   Nonalcoholic fatty liver disease   Hyperlipidemia   History of traumatic brain injury   Generalized anxiety disorder   Arthralgia of hip   Foot pain, right    Body mass index is 62.85 kg/m.  (Morbid obesity): This complicates overall care and prognosis   Breakthrough seizures in a patient with refractory epilepsy who has a VNS in place: Consulted Dr. Wilford Corner, neurologist.  No changes to antiepileptics for now.  Outpatient follow-up with his primary neurologist recommended.   Right foot pain, right plantar fasciitis on x-ray of the ankle/foot: Analgesics as needed for pain.  Consulted Dr. Events, podiatrist, to assist with management.  CT right ankle has been ordered for further evaluation.   Other comorbidities include osteoarthritis left lower  extremities, hypertension, hyperlipidemia, tobacco use disorder, OSA on CPAP    Diet Order             Diet regular Fluid consistency: Thin  Diet effective now                            Consultants: Neurologist Podiatrist  Procedures: None    Medications:    atorvastatin  20 mg Oral Daily   cloBAZam  20 mg Oral QHS   heparin  5,000 Units Subcutaneous Q8H   levETIRAcetam  1,500 mg Oral BID   meloxicam  15 mg Oral Daily   oxcarbazepine  1,200 mg Oral BID   pyridoxine  200 mg Oral Daily   Continuous Infusions:   Anti-infectives (From admission, onward)    None              Family Communication/Anticipated D/C date and plan/Code Status   DVT prophylaxis: heparin injection 5,000 Units Start: 10/22/22 0600 Place TED hose Start: 10/21/22 1712     Code Status: Full Code  Family Communication: None Disposition Plan: Plan for discharge home tomorrow   Status is: Inpatient Remains inpatient appropriate because: Uncontrolled right foot pain       Subjective:   Interval events noted.  He complains of severe pain in the right foot.  He said he is not hypertensive and his blood pressure is usually excellent.  He does not want a heart  healthy diet.  Objective:    Vitals:   10/22/22 0727 10/22/22 0815 10/22/22 0835 10/22/22 0926  BP:   (!) 155/83 (!) 156/75  Pulse:  77 83   Resp:   20 20  Temp:   97.7 F (36.5 C) 97.8 F (36.6 C)  TempSrc:   Oral   SpO2: (S) 96% 97% 100% 100%  Weight:      Height:       No data found.  No intake or output data in the 24 hours ending 10/22/22 1432 Filed Weights   10/21/22 1303  Weight: (!) 210.2 kg    Exam:  GEN: NAD SKIN: Warm and dry EYES: No pallor or icterus, EOMI ENT: MMM CV: RRR PULM: CTA B ABD: soft, obese, NT, +BS CNS: AAO x 3, mild left ptosis EXT: Splint on right foot        Data Reviewed:   I have personally reviewed following labs and imaging  studies:  Labs: Labs show the following:   Basic Metabolic Panel: Recent Labs  Lab 10/21/22 1327 10/22/22 0753  NA 135 133*  K 4.1 3.7  CL 106 101  CO2 23 24  GLUCOSE 90 142*  BUN 16 11  CREATININE 0.64 0.71  CALCIUM 8.3* 8.3*  MG 2.3  --    GFR Estimated Creatinine Clearance: 201.8 mL/min (by C-G formula based on SCr of 0.71 mg/dL). Liver Function Tests: Recent Labs  Lab 10/21/22 2032  AST 16  ALT 21  ALKPHOS 77  BILITOT 0.7  PROT 6.4*  ALBUMIN 3.3*   No results for input(s): "LIPASE", "AMYLASE" in the last 168 hours. No results for input(s): "AMMONIA" in the last 168 hours. Coagulation profile No results for input(s): "INR", "PROTIME" in the last 168 hours.  CBC: Recent Labs  Lab 10/22/22 0753  WBC 3.8*  HGB 12.9*  HCT 39.3  MCV 89.3  PLT 97*   Cardiac Enzymes: No results for input(s): "CKTOTAL", "CKMB", "CKMBINDEX", "TROPONINI" in the last 168 hours. BNP (last 3 results) Recent Labs    10/03/22 1525  PROBNP 34.0   CBG: Recent Labs  Lab 10/21/22 1254  GLUCAP 90   D-Dimer: No results for input(s): "DDIMER" in the last 72 hours. Hgb A1c: No results for input(s): "HGBA1C" in the last 72 hours. Lipid Profile: No results for input(s): "CHOL", "HDL", "LDLCALC", "TRIG", "CHOLHDL", "LDLDIRECT" in the last 72 hours. Thyroid function studies: No results for input(s): "TSH", "T4TOTAL", "T3FREE", "THYROIDAB" in the last 72 hours.  Invalid input(s): "FREET3" Anemia work up: No results for input(s): "VITAMINB12", "FOLATE", "FERRITIN", "TIBC", "IRON", "RETICCTPCT" in the last 72 hours. Sepsis Labs: Recent Labs  Lab 10/22/22 0753  WBC 3.8*    Microbiology No results found for this or any previous visit (from the past 240 hour(s)).  Procedures and diagnostic studies:  CT Head Wo Contrast  Result Date: 10/21/2022 CLINICAL DATA:  Larey Seat, seizure EXAM: CT HEAD WITHOUT CONTRAST TECHNIQUE: Contiguous axial images were obtained from the base of the  skull through the vertex without intravenous contrast. RADIATION DOSE REDUCTION: This exam was performed according to the departmental dose-optimization program which includes automated exposure control, adjustment of the mA and/or kV according to patient size and/or use of iterative reconstruction technique. COMPARISON:  12/04/2020 FINDINGS: Brain: No acute infarct or hemorrhage. Lateral ventricles and midline structures are unremarkable. No acute extra-axial fluid collections. No mass effect. Vascular: No hyperdense vessel or unexpected calcification. Skull: Normal. Negative for fracture or focal lesion. Sinuses/Orbits: No acute finding. Other:  None. IMPRESSION: 1. Stable head CT, no acute intracranial process. Electronically Signed   By: Sharlet Salina M.D.   On: 10/21/2022 16:21   DG Foot Complete Right  Result Date: 10/21/2022 CLINICAL DATA:  Post fall, now with lateral sided foot and ankle pain. EXAM: RIGHT FOOT COMPLETE - 3+ VIEW COMPARISON:  Ankle radiographs earlier same day FINDINGS: There is diffuse soft tissue swelling about the imaged lower leg, ankle and hindfoot. Note is made of an os peroneus. No definite displaced acute fracture. Mild degenerative change of several midfoot articulations. Joint spaces appear otherwise well preserved. No significant hallux valgus deformity. No erosions. Pes cavus deformity is suspected on this nonweightbearing radiographs. Small plantar calcaneal spur. Dystrophic calcifications involving the posterior aspect of the plantar fascia. Enthesopathic change involving the Achilles tendon insertion site. Dermal calcifications overlie the anterior aspect of the lower leg. No radiopaque foreign body. IMPRESSION: 1. Diffuse soft tissue swelling about the imaged lower leg, ankle and hindfoot without underlying displaced acute fracture or dislocation. 2. Pes cavus deformity is suspected on this nonweightbearing radiographs. 3. Small plantar calcaneal spur with dystrophic  calcifications involving the posterior aspect of the plantar fascia, nonspecific though could be seen in the setting of plantar fasciitis. Electronically Signed   By: Simonne Come M.D.   On: 10/21/2022 15:06   DG Ankle Complete Right  Result Date: 10/21/2022 CLINICAL DATA:  Post fall, now with lateral sided foot and ankle pain. EXAM: RIGHT ANKLE - COMPLETE 3+ VIEW COMPARISON:  Right foot radiographs earlier same day FINDINGS: Mild soft tissue swelling about the imaged lower leg, ankle and hindfoot. Peripherally corticated ossicles adjacent to the medial malleolus likely represent the sequela of remote avulsive injury. Note is made of a os peroneus. No acute fracture or dislocation. Joint spaces appear preserved. The ankle mortise appears preserved. No ankle joint effusion. Small plantar calcaneal spur with dystrophic calcification overlying expected location of the posterior aspect of the plantar fascia. Enthesopathic change involving the Achilles tendon insertion site. Dermal calcifications overlie the anterior aspect of the lower leg. IMPRESSION: 1. Mild soft tissue swelling about the imaged lower leg, ankle and hindfoot without associated acute fracture or dislocation. 2. Small plantar calcaneal spur with dystrophic calcification overlying expected location of the posterior aspect of the plantar fascia, nonspecific though could be seen in the setting of plantar fasciitis. Electronically Signed   By: Simonne Come M.D.   On: 10/21/2022 15:04               LOS: 1 day   Dashanti Burr  [facility]   Pager on [email or web address]. If 7PM-7AM, please contact night-coverage at [email or web address]     10/22/2022, 2:32 PM

## 2022-10-22 NOTE — Evaluation (Signed)
Occupational Therapy Evaluation Patient Details Name: Randy Cordova. MRN: [id number] DOB: 1970-05-17 Today's Date: 10/22/2022   History of Present Illness 52 y/o male presented to ED on 10/21/22 for ankle pain and seizure activity. Multiple seizures in ED. X-ray not suggestive of R ankle fx. PMH: vagal nerve stimulator 08/2021, hx of seizures 2/2 TBI, OSA on CPAP, HTN   Clinical Impression   Pt seen for OT/PT co-eval. Pt received in bed, stating 10/10 pain. Pt lives in [place], but stays with friend in [place] in a 2L home with bed/bath upstairs (will be there until December). Pt expressing concerns with current hospital experience. Presents with weakness, impaired balance, decreased tolerance to activity and pain in R ankle. Bed mobility increased time + supervision. Stands from EOB x 3 with minA and bari RW, +2 for safety. Completes urine void in standing with urinal; CGA for safety and seated rest break required. Pt able to manage clothing with CGA. Anticipate pt will require minA-CGA for LB ADLs and setup for UB ADLs. Pt will benefit from skilled OT to address functional deficits as listed below. Anticipate the need for follow up OT services upon acute hospital DC.       If plan is discharge home, recommend the following: A lot of help with walking and/or transfers;A little help with bathing/dressing/bathroom;Assistance with cooking/housework;Assist for transportation;Help with stairs or ramp for entrance    Functional Status Assessment  Patient has had a recent decline in their functional status and demonstrates the ability to make significant improvements in function in a reasonable and predictable amount of time.  Equipment Recommendations  Other (comment) (defer)    Recommendations for Other Services Other (comment)     Precautions / Restrictions Precautions Precautions: Other (comment) Precaution Comments: seizure (has VNS device), R ankle pain Required Braces or Orthoses:  Splint/Cast Splint/Cast: RLE Restrictions Weight Bearing Restrictions: Yes RLE Weight Bearing: Weight bearing as tolerated      Mobility Bed Mobility Overal bed mobility: Needs Assistance Bed Mobility: Supine to Sit     Supine to sit: Supervision Sit to supine: Supervision   General bed mobility comments: increased time, effortful    Transfers Overall transfer level: Needs assistance Equipment used: Rolling walker (2 wheels) Transfers: Sit to/from Stand Sit to Stand: +2 safety/equipment                  Balance Overall balance assessment: Needs assistance Sitting-balance support: No upper extremity supported, Feet supported Sitting balance-Leahy Scale: Good     Standing balance support: Reliant on assistive device for balance, Bilateral upper extremity supported Standing balance-Leahy Scale: Poor                             ADL either performed or assessed with clinical judgement   ADL Overall ADL's : Needs assistance/impaired                             Toileting- Clothing Manipulation and Hygiene: Sit to/from stand;+2 for safety/equipment;Contact guard assist       Functional mobility during ADLs: +2 for safety/equipment;Rolling walker (2 wheels)        Pertinent Vitals/Pain Pain Assessment Pain Assessment: 0-10 Pain Score: 10-Worst pain ever Pain Location: R ankle Pain Descriptors / Indicators: Constant, Aching, Discomfort, Grimacing, Guarding Pain Intervention(s): Limited activity within patient's tolerance, Monitored during session     Extremity/Trunk Assessment Upper Extremity Assessment Upper Extremity  Assessment: Defer to OT evaluation   Lower Extremity Assessment Lower Extremity Assessment: RLE deficits/detail RLE: Unable to fully assess due to pain;Unable to fully assess due to immobilization   Cervical / Trunk Assessment Cervical / Trunk Assessment: Other exceptions Cervical / Trunk Exceptions: increased  body habitus   Communication Communication Communication: No apparent difficulties   Cognition Arousal: Alert Behavior During Therapy: WFL for tasks assessed/performed Overall Cognitive Status: Within Functional Limits for tasks assessed                                 General Comments: pt mentioning his anger at various parts of his hospital experience thus far                Home Living Family/patient expects to be discharged to:: Private residence (Pt lives in [place]; stays with his best friend while in the [place] (until December).) Living Arrangements: Non-relatives/Friends Available Help at Discharge: Friend(s);Available PRN/intermittently Type of Home: House Home Access: Stairs to enter     Home Layout: Two level;Bed/bath upstairs Alternate Level Stairs-Number of Steps: Flight   Bathroom Shower/Tub: Research scientist (medical): Yes   Home Equipment: Rollator (4 wheels)          Prior Functioning/Environment Prior Level of Function : Independent/Modified Independent               ADLs Comments: MOD I        OT Problem List: Obesity;Pain;Decreased activity tolerance;Impaired balance (sitting and/or standing);Decreased strength;Decreased range of motion      OT Treatment/Interventions: Self-care/ADL training;Therapeutic exercise;Energy conservation;DME and/or AE instruction;Therapeutic activities;Patient/family education;Balance training    OT Goals(Current goals can be found in the care plan section) Acute Rehab OT Goals Patient Stated Goal: to go home with my wife OT Goal Formulation: With patient Time For Goal Achievement: 11/05/22 Potential to Achieve Goals: Good  OT Frequency: Min 1X/week    Co-evaluation PT/OT/SLP Co-Evaluation/Treatment: Yes Reason for Co-Treatment: For patient/therapist safety;To address functional/ADL transfers PT goals addressed during session: Mobility/safety with mobility;Balance OT goals  addressed during session: ADL's and self-care;Strengthening/ROM      AM-PAC OT "6 Clicks" Daily Activity     Outcome Measure Help from another person eating meals?: None Help from another person taking care of personal grooming?: None Help from another person toileting, which includes using toliet, bedpan, or urinal?: A Little Help from another person bathing (including washing, rinsing, drying)?: A Little Help from another person to put on and taking off regular upper body clothing?: A Little Help from another person to put on and taking off regular lower body clothing?: A Little 6 Click Score: 20   End of Session Equipment Utilized During Treatment: Other (comment) Nurse Communication: Other (comment) (pt requesting a meal)  Activity Tolerance: Patient limited by pain Patient left: in bed;with call bell/phone within reach;with bed alarm set  OT Visit Diagnosis: Pain;Unsteadiness on feet (R26.81);Other abnormalities of gait and mobility (R26.89);History of falling (Z91.81) Pain - Right/Left: Right Pain - part of body: Ankle and joints of foot                Time: 4098-1191 OT Time Calculation (min): 38 min Charges:  OT General Charges $OT Visit: 1 Visit OT Evaluation $OT Eval Moderate Complexity: 1 Mod OT Treatments $Self Care/Home Management : 8-22 mins  Keeley Sussman L. Elyn Krogh, OTR/L  10/22/22, 3:31 PM

## 2022-10-22 NOTE — ED Notes (Signed)
Pt states pain woke him up from sleep.

## 2022-10-22 NOTE — Consult Note (Signed)
PODIATRY CONSULTATION  NAME Randy Cordova. MRN [id number] DOB [birth year] DOA 10/21/2022   Reason for consult: RT ankle injury Chief Complaint  Patient presents with   Ankle Pain   Seizures    Consulting physician: Lurene Shadow MD, [facility]  History of present illness: 52 y.o. male admitted for severe pain after injury to the right ankle inducing multiple seizures apparently secondary to the pain. PMHx TBI's, history of seizures secondary to TBI's, status post vagal nerve stimulator placement, morbid obesity, OSA on CPAP, hypertension, hyperlipidemia.   Initially started at his friend's home where he is staying when he stepped and heard a "thud" to the right foot/ankle with immediate severe pain. Immediate loss of consciousness. Upon consciousness he apparently continued to have recurrent seizure episodes secondary to the severe pain when he tried to weight bear. Transported by EMS to [facility] ED and admitted.   Vitals in the ED showed temperature of 98.9, respiration rate of 18, heart rate of 84, blood pressure 135/67, SpO2 of 98% on room air.   Serum sodium is 135, potassium 4.1, chloride 106, bicarb 23, BUN of 16, serum creatinine 0.64, EGFR greater than 60, nonfasting blood glucose of 90, Keppra/levetiracetam level is pending, magnesium level was 2.3.  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   Arthritis 2020   Epilepsy (HCC)    Generalized anxiety disorder 12/23/2020   Hyperlipidemia 12/23/2020   Hypothyroidism 10/29/2010   OSA on CPAP    Primary hypertension 12/23/2020   Sleep apnea 1997   On CPAP   TBI (traumatic brain injury) (HCC)    Thrombocytopenia (HCC)    Type 2 diabetes mellitus (HCC) 12/23/2020       Latest Ref Rng & Units 10/22/2022    7:53 AM 10/03/2022    2:39 PM 03/07/2022   12:40 PM  CBC  WBC 4.0 - 10.5 K/uL 3.8  6.1  5.3   Hemoglobin 13.0 - 17.0 g/dL 40.9  81.1  91.4   Hematocrit 39.0 - 52.0 % 39.3  40.8  41.2    Platelets 150 - 400 K/uL 97  116.0  112.0        Latest Ref Rng & Units 10/22/2022    7:53 AM 10/21/2022    1:27 PM 10/03/2022    2:39 PM  BMP  Glucose 70 - 99 mg/dL 782  90  82   BUN 6 - 20 mg/dL 11  16  11    Creatinine 0.61 - 1.24 mg/dL 9.56  2.13  0.86   Sodium 135 - 145 mmol/L 133  135  135   Potassium 3.5 - 5.1 mmol/L 3.7  4.1  4.1   Chloride 98 - 111 mmol/L 101  106  101   CO2 22 - 32 mmol/L 24  23  24    Calcium 8.9 - 10.3 mg/dL 8.3  8.3  8.8       Physical Exam: General: The patient is alert and oriented x3 in no acute distress.   Dermatology: Denies any lacerations or open wounds noted prior to application of posterior splint  Vascular: Capillary refill WNL to digits  Neurological: Grossly intact via light touch  Musculoskeletal Exam: Posterior splint intact to RLE. Left intact.  CT ANKLE RIGHT WO CONTRAST 10/22/2022 Muscles and Tendons No acute musculotendinous abnormality by CT. 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 No tenosynovial fluid collections. Soft tissues  Soft tissue swelling most pronounced along the lateral  aspect of the hindfoot and midfoot. No organized fluid collection or hematoma.   IMPRESSION: 1. No acute fracture or malalignment of the right ankle. 2. Soft tissue swelling most pronounced along the lateral aspect of the hindfoot and midfoot. No organized fluid collection or hematoma. 3. Large ossification along the course of the right peroneus longus tendon sheath at the level of the peroneal tubercle measuring 1.8 x 1.4 cm. This is more proximally located than a typical os peroneum. 4. Mild-to-moderate osteoarthritic changes of the right foot and ankle.  ASSESSMENT/PLAN OF CARE RT foot injury. DOI 10/21/2022  -CT RT ankle final. VNS placement unable to have MRI -No plans for surgery at this time -NWB RLE w/ walker or crutches. Leave posterior splint intact.  -PT eval appreciated -Concern that patient  unable to be mobile due to inability to be NWB.  -Severe RT foot/ankle pain lowering threshold for seizure -Patient states that he lives in [place] the majority of the year. Currently living in friends home here in the [place] with many stairs.  -May require DC to rehab facility. Case management input appreciated -Podiatry will sign off. F/u in office 4 weeks post discharge for f/u xray and transition patient to weight bearing if pain improved.    Thank you for the consult.  Please contact me directly via secure chat with any questions or concerns.     [name], DPM [facility]  Dr. [name], DPM    [address] [place], [place] [address]                Office [phone]  Fax [phone]

## 2022-10-22 NOTE — TOC Initial Note (Signed)
Transition of Care (TOC) - Initial/Assessment Note    Patient Details  Name: Randy Cordova. MRN: [id number] Date of Birth: Apr 25, 1970  Transition of Care [facility] CM/SW Contact:    Allena Katz, LCSW Phone Number: 10/22/2022, 4:15 PM  Clinical Narrative:    TOC met patient at bedside to discuss rehab suggestions. Pt agrees that he needs rehab and would like medicare.gov list. Pt reports he has two houses, one here in [place] where he lives with his best friend and another in [place] where his wife is. Pt reporting he has involved patient experience due to treatment he had in the ED. Pt reports he would [facility]. Pt states in the meantime he would like to discuss rehab with me and would like me to return tomorrow so we can discuss plan further.               Expected Discharge Plan: Skilled Nursing Facility Barriers to Discharge: Continued Medical Work up   Patient Goals and CMS Choice Patient states their goals for this hospitalization and ongoing recovery are:: go to rehab CMS Medicare.gov Compare Post Acute Care list provided to:: Patient        Expected Discharge Plan and Services                                              Prior Living Arrangements/Services     Patient language and need for interpreter reviewed:: Yes Do you feel safe going back to the place where you live?: Yes      Need for Family Participation in Patient Care: Yes (Comment)        Activities of Daily Living      Permission Sought/Granted                  Emotional Assessment       Orientation: : Oriented to Self, Oriented to Place, Oriented to  Time, Oriented to Situation      Admission diagnosis:  Seizures (HCC) [R56.9] Inadequate pain control [R52] Acute right ankle pain [M25.571] Patient Active Problem List   Diagnosis Date Noted   Inadequate pain control 10/21/2022   Foot pain, right 10/03/2022   Localized swelling of lower extremity  10/03/2022   COVID-19 01/22/2022   Preventative health care 12/21/2021   Prediabetes 12/21/2021   Rash 12/21/2021   Need for shingles vaccine 12/21/2021   Vitamin D deficiency 12/23/2020   Tobacco user 12/23/2020   Primary hypertension 12/23/2020   Osteoarthritis of both knees 12/23/2020   Nonalcoholic fatty liver disease 12/23/2020   Hyperlipidemia 12/23/2020   Hepatic cirrhosis (HCC) 12/23/2020   Generalized anxiety disorder 12/23/2020   Arthralgia of hip 12/23/2020   Epilepsy (HCC) 12/04/2020   Acute left hemiparesis (HCC) 12/04/2020   Morbid obesity with BMI of 60.0-69.9, adult (HCC) 12/04/2020   History of traumatic brain injury 06/30/2020   Grand mal seizure (HCC) 04/18/2020   Breakthrough seizures, recurrent (HCC) 03/19/2020   Thrombocytopenia (HCC)    OSA on CPAP    Hypothyroidism 10/29/2010   Decreased testosterone level 10/29/2010   PCP:  Eden Emms, NP Pharmacy:   [facility] [id number] [place], [place] - [address] [place] [address] Phone: [phone] Fax: [phone]     Social Determinants of Health (SDOH) Social History: SDOH Screenings  Food Insecurity: Food Insecurity Present (10/02/2022)  Housing: Low Risk  (10/02/2022)  Transportation Needs: Unmet Transportation Needs (10/02/2022)  Alcohol Screen: Low Risk  (10/02/2022)  Depression (PHQ2-9): High Risk (10/16/2021)  Financial Resource Strain: Medium Risk (10/02/2022)  Physical Activity: Insufficiently Active (10/02/2022)  Social Connections: Moderately Isolated (10/02/2022)  Stress: Stress Concern Present (10/02/2022)  Tobacco Use: Medium Risk (10/21/2022)   SDOH Interventions:     Readmission Risk Interventions     No data to display

## 2022-10-22 NOTE — Evaluation (Signed)
Physical Therapy Evaluation Patient Details Name: Randy Cordova. MRN: [id number] DOB: 1970/09/02 Today's Date: 10/22/2022  History of Present Illness  52 y/o male presented to ED on 10/21/22 for ankle pain and seizure activity. Multiple seizures in ED. X-ray not suggestive of R ankle fx. PMH: vagal nerve stimulator 08/2021, hx of seizures 2/2 TBI, OSA on CPAP, HTN  Clinical Impression  Patient admitted with the above. PTA, patient here from [place] for next 2-3 months for medical appointments. He is staying with a friend in a 2 level house with bed/bath upstairs. Patient expressing concerns on current hospitalization at this time. Patient presents with weakness, impaired balance, pain in R ankle, and decreased activity tolerance. Patient able to complete bed mobility with supervision. Stood from EOB x 3 during session with minA and bariatric RW with +2 for safety. Attempted to take sidesteps at EOB with cues to utilize BUE on RW to keep weight off R ankle with poor follow through. Patient will benefit from skilled PT services during acute stay to address listed deficits. Patient will benefit from ongoing therapy at discharge to maximize functional independence and safety.       If plan is discharge home, recommend the following: A lot of help with walking and/or transfers;A little help with bathing/dressing/bathroom;Assistance with cooking/housework;Help with stairs or ramp for entrance   Can travel by private vehicle        Equipment Recommendations Rolling Floyd Lusignan (2 wheels) (bariatric)  Recommendations for Other Services       Functional Status Assessment Patient has had a recent decline in their functional status and demonstrates the ability to make significant improvements in function in a reasonable and predictable amount of time.     Precautions / Restrictions Precautions Precautions: Other (comment) Precaution Comments: seizure (has VNS device), R ankle pain Required Braces or  Orthoses: Splint/Cast Splint/Cast: RLE Restrictions Weight Bearing Restrictions: Yes RLE Weight Bearing: Weight bearing as tolerated      Mobility  Bed Mobility Overal bed mobility: Needs Assistance Bed Mobility: Supine to Sit, Sit to Supine     Supine to sit: Supervision Sit to supine: Supervision   General bed mobility comments: increased time and effort but able to complete without assistance    Transfers Overall transfer level: Needs assistance Equipment used: Rolling Araly Kaas (2 wheels) (bariatric) Transfers: Sit to/from Stand Sit to Stand: Min assist, +2 safety/equipment                Ambulation/Gait Ambulation/Gait assistance: Min assist, +2 safety/equipment Gait Distance (Feet): 4 Feet Assistive device: Rolling Zahlia Deshazer (2 wheels) Gait Pattern/deviations: Step-to pattern Gait velocity: decreased     General Gait Details: cues for utilizing B UEs to offweight R LE. minA for balance and +2 for safety. Only able to take sidesteps at this time  Careers information officer     Tilt Bed    Modified Rankin (Stroke Patients Only)       Balance Overall balance assessment: Needs assistance Sitting-balance support: No upper extremity supported, Feet supported Sitting balance-Leahy Scale: Good     Standing balance support: Bilateral upper extremity supported, Reliant on assistive device for balance Standing balance-Leahy Scale: Poor                               Pertinent Vitals/Pain Pain Assessment Pain Assessment: 0-10 Pain Score: 10-Worst pain ever Pain Location: R ankle Pain  Descriptors / Indicators: Constant, Aching, Discomfort, Grimacing, Guarding Pain Intervention(s): Limited activity within patient's tolerance, Monitored during session    Home Living Family/patient expects to be discharged to:: Private residence (Pt lives in [place]; stays with his best friend while in the [place] (until December).) Living  Arrangements: Non-relatives/Friends Available Help at Discharge: Friend(s);Available PRN/intermittently Type of Home: House Home Access: Stairs to enter     Alternate Level Stairs-Number of Steps: Flight Home Layout: Two level;Bed/bath upstairs Home Equipment: Rollator (4 wheels)      Prior Function Prior Level of Function : Independent/Modified Independent               ADLs Comments: MOD I     Extremity/Trunk Assessment   Upper Extremity Assessment Upper Extremity Assessment: Defer to OT evaluation    Lower Extremity Assessment Lower Extremity Assessment: RLE deficits/detail RLE: Unable to fully assess due to pain;Unable to fully assess due to immobilization    Cervical / Trunk Assessment Cervical / Trunk Assessment: Other exceptions Cervical / Trunk Exceptions: increased body habitus  Communication   Communication Communication: No apparent difficulties  Cognition Arousal: Alert Behavior During Therapy: WFL for tasks assessed/performed Overall Cognitive Status: Within Functional Limits for tasks assessed                                 General Comments: pt mentioning his anger at various parts of his hospital experience thus far        General Comments      Exercises     Assessment/Plan    PT Assessment Patient needs continued PT services  PT Problem List Decreased strength;Decreased balance;Decreased activity tolerance;Decreased mobility;Decreased knowledge of use of DME;Decreased knowledge of precautions;Pain       PT Treatment Interventions DME instruction;Gait training;Therapeutic activities;Functional mobility training;Therapeutic exercise;Stair training;Balance training;Patient/family education    PT Goals (Current goals can be found in the Care Plan section)  Acute Rehab PT Goals Patient Stated Goal: to figure out what's wrong with my foot PT Goal Formulation: With patient Time For Goal Achievement: 11/05/22 Potential to  Achieve Goals: Fair    Frequency Min 1X/week     Co-evaluation PT/OT/SLP Co-Evaluation/Treatment: Yes Reason for Co-Treatment: For patient/therapist safety;To address functional/ADL transfers PT goals addressed during session: Mobility/safety with mobility;Balance OT goals addressed during session: ADL's and self-care;Strengthening/ROM       AM-PAC PT "6 Clicks" Mobility  Outcome Measure Help needed turning from your back to your side while in a flat bed without using bedrails?: A Little Help needed moving from lying on your back to sitting on the side of a flat bed without using bedrails?: A Little Help needed moving to and from a bed to a chair (including a wheelchair)?: A Little Help needed standing up from a chair using your arms (e.g., wheelchair or bedside chair)?: A Little Help needed to walk in hospital room?: A Lot Help needed climbing 3-5 steps with a railing? : Total 6 Click Score: 15    End of Session   Activity Tolerance: Patient limited by pain Patient left: in bed;with call bell/phone within reach Nurse Communication: Mobility status PT Visit Diagnosis: Unsteadiness on feet (R26.81);Muscle weakness (generalized) (M62.81);Other abnormalities of gait and mobility (R26.89)    Time: 7846-9629 PT Time Calculation (min) (ACUTE ONLY): 38 min   Charges:   PT Evaluation $PT Eval Moderate Complexity: 1 Mod PT Treatments $Therapeutic Activity: 8-22 mins PT General Charges $$ ACUTE PT  VISIT: 1 Visit         Maylon Peppers, PT, DPT Physical Therapist - [place]  [facility]   Marnisha Stampley A Lekendrick Alpern 10/22/2022, 1:09 PM

## 2022-10-22 NOTE — Consult Note (Signed)
Neurology Consultation  Reason for Consult: Flurry of seizure Referring Physician: Dr. Lurene Shadow  CC: Seizures  History is obtained from: Patient, chart  HPI: Randy Cordova. is a 52 y.o. male veteran of the [employer]	[place], who has a past medical history of medically refractory epilepsy status post VNS-currently on oxcarbazepine, Keppra, and follows with Dr. Hendricks Limes at [facility] neurology, TBI history presented to the emergency department after he had seizures after rolling his ankle and being in excessive pain.  He was trying to go up the stairs and he twisted his ankle and the next thing he knew was that he was on the ground and was witnessed by somebody to be shaking.  He reports having had 10-11 seizures since yesterday since he has been in pain due to the twisted right ankle.  His right leg pain and ankle is being evaluated.  Preliminary radiographs with no evidence of fracture but he remains in 10/10 pain in that ankle. He also reports that he felt that the left side of his face has been weaker.  He has a history of Bell's palsy in the past with no residual symptoms but reports that those symptoms are worse.  Also reports difficulty with walking due to pain.  He also reports not having had slept very well-he has a sleep tracker app on his watch/phone which only logged 3-1/2 hours of broken sleep last night.  Also reports having word finding difficulty which is not his usual baseline. Prior to this floor of seizures, only had 3 seizures all of last month.  Has had good seizure control with VNS where he reportedly could have had more than 15 days of seizures every month and after VNS placement was down to maybe 2 or 3 days of seizure activity-complex partial seizures according to him.  I did witness a video where he had jerking of his right arm and neck version to the right gaze to the right with vocalization which lasted about a minute.  He has a seizure alert dog with him at home and lives  with his best friend. He also reported that he does not want to be in a shared room since when he has seizures he can have incontinence and that is extremely embarrassing and bad for his privacy.  ED notes mention short less than 1 minute seizure activity with bilateral arms rolling back of the eyes but back to normal mentation without significant postictal confusion, which also happened in the ED per the EDP.  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   Arthritis 2020   Epilepsy (HCC)    Generalized anxiety disorder 12/23/2020   Hyperlipidemia 12/23/2020   Hypothyroidism 10/29/2010   OSA on CPAP    Primary hypertension 12/23/2020   Sleep apnea 1997   On CPAP   TBI (traumatic brain injury) (HCC)    Thrombocytopenia (HCC)    Type 2 diabetes mellitus (HCC) 12/23/2020     Family History  Problem Relation Age of Onset   Arthritis/Rheumatoid Mother    Arthritis Mother    Obesity Mother    Cancer Maternal Grandmother        lung cancer   Obesity Maternal Aunt    Alcohol abuse Maternal Uncle    Renal cancer Maternal Uncle    Cancer Maternal Uncle        lung, colon cancer  Early death Maternal Uncle      Social History:   reports that he quit smoking about 3 years ago. His smoking use included cigarettes, cigars, and e-cigarettes. He started smoking about 38 years ago. He has a 35 pack-year smoking history. He has never used smokeless tobacco. He reports that he does not currently use alcohol. He reports that he does not use drugs.  Medications  Current Facility-Administered Medications:    acetaminophen (TYLENOL) tablet 650 mg, 650 mg, Oral, Q6H PRN **OR** acetaminophen (TYLENOL) suppository 650 mg, 650 mg, Rectal, Q6H PRN, Cox, Amy N, DO   atorvastatin (LIPITOR) tablet 20 mg, 20 mg, Oral, Daily, Cox, Amy N, DO, 20 mg at 10/22/22 1224   bisacodyl (DULCOLAX) EC tablet 5 mg,  5 mg, Oral, Daily PRN, Cox, Amy N, DO   cloBAZam (ONFI) tablet 20 mg, 20 mg, Oral, QHS, Cox, Amy N, DO, 20 mg at 10/21/22 2101   fentaNYL (SUBLIMAZE) injection 50 mcg, 50 mcg, Intravenous, Q4H PRN, Cox, Amy N, DO, 50 mcg at 10/22/22 0346   heparin injection 5,000 Units, 5,000 Units, Subcutaneous, Q8H, Cox, Amy N, DO, 5,000 Units at 10/22/22 9147   hydrALAZINE (APRESOLINE) injection 5 mg, 5 mg, Intravenous, Q6H PRN, Cox, Amy N, DO   ketoconazole (NIZORAL) 2 % cream, , Topical, Daily PRN, Cox, Amy N, DO   ketorolac (TORADOL) 15 MG/ML injection 15 mg, 15 mg, Intravenous, Q6H PRN, Cox, Amy N, DO   levETIRAcetam (KEPPRA) tablet 1,500 mg, 1,500 mg, Oral, BID, Cox, Amy N, DO, 1,500 mg at 10/22/22 1224   LORazepam (ATIVAN) injection 2 mg, 2 mg, Intravenous, PRN, Cox, Amy N, DO   meloxicam (MOBIC) tablet 15 mg, 15 mg, Oral, Daily, Cox, Amy N, DO, 15 mg at 10/22/22 1224   morphine (PF) 4 MG/ML injection 4 mg, 4 mg, Intravenous, Q4H PRN, Cox, Amy N, DO, 4 mg at 10/22/22 1234   nicotine (NICODERM CQ - dosed in mg/24 hours) patch 21 mg, 21 mg, Transdermal, Daily PRN **OR** nicotine polacrilex (NICORETTE) gum 2 mg, 2 mg, Oral, PRN, Cox, Amy N, DO   ondansetron (ZOFRAN) tablet 4 mg, 4 mg, Oral, Q6H PRN **OR** ondansetron (ZOFRAN) injection 4 mg, 4 mg, Intravenous, Q6H PRN, Cox, Amy N, DO   Oxcarbazepine (TRILEPTAL) tablet 1,200 mg, 1,200 mg, Oral, BID, Cox, Amy N, DO, 1,200 mg at 10/22/22 1223   pyridOXINE (VITAMIN B6) tablet 200 mg, 200 mg, Oral, Daily, Cox, Amy N, DO, 200 mg at 10/22/22 1224   senna-docusate (Senokot-S) tablet 1 tablet, 1 tablet, Oral, QHS PRN, Cox, Amy N, DO   triamcinolone cream (KENALOG) 0.1 % cream, , Topical, BID PRN, Cox, Amy N, DO   Exam: Current vital signs: BP (!) 156/75 (BP Location: Right Arm)   Pulse 83   Temp 97.8 F (36.6 C)   Resp 20   Ht 6' (1.829 m)   Wt (!) 210.2 kg   SpO2 100%   BMI 62.85 kg/m  Vital signs in last 24 hours: Temp:  [97.7 F (36.5 C)-98.9 F  (37.2 C)] 97.8 F (36.6 C) (09/17 0926) Pulse Rate:  [66-89] 83 (09/17 0835) Resp:  [11-24] 20 (09/17 0926) BP: (101-156)/(63-87) 156/75 (09/17 0926) SpO2:  [85 %-100 %] 100 % (09/17 0926) Weight:  [210.2 kg] 210.2 kg (09/16 1303) General: Awake alert in no distress HEENT: Normocephalic atraumatic Lungs clear Cardiovascular: Regular rhythm Extremities: Right ankle immobilized. Neurological exam Awake alert oriented x 3 Speech is mildly dysarthric There is stuttering of speech  and mild word finding difficulty but is able to give me reliable history. Cranial nerves II to XII: Pupils equal round reactive right, external nose intact, visual fields full, has mild left ptosis which she says is worse than baseline, facial sensation diminished on the left with diminishment of the sensation to all modalities including vibration on the forehead, face appears symmetric, tongue and palate midline. Motor examination with no drift in the upper extremities.  Right lower extremity examination is limited due to severe ankle pain.  Left upper extremity is antigravity with mild drift. Sensation intact light touch without extinction. Coordination examination reveals no dysmetria.  Labs I have reviewed labs in epic and the results pertinent to this consultation are:  CBC    Component Value Date/Time   WBC 3.8 (L) 10/22/2022 0753   RBC 4.40 10/22/2022 0753   HGB 12.9 (L) 10/22/2022 0753   HCT 39.3 10/22/2022 0753   PLT 97 (L) 10/22/2022 0753   MCV 89.3 10/22/2022 0753   MCH 29.3 10/22/2022 0753   MCHC 32.8 10/22/2022 0753   RDW 13.7 10/22/2022 0753   LYMPHSABS 2.0 12/04/2020 1912   MONOABS 0.3 12/04/2020 1912   EOSABS 0.1 12/04/2020 1912   BASOSABS 0.0 12/04/2020 1912    CMP     Component Value Date/Time   NA 133 (L) 10/22/2022 0753   K 3.7 10/22/2022 0753   CL 101 10/22/2022 0753   CO2 24 10/22/2022 0753   GLUCOSE 142 (H) 10/22/2022 0753   BUN 11 10/22/2022 0753   CREATININE 0.71  10/22/2022 0753   CREATININE 0.67 (L) 12/21/2021 1635   CALCIUM 8.3 (L) 10/22/2022 0753   PROT 6.4 (L) 10/21/2022 2032   ALBUMIN 3.3 (L) 10/21/2022 2032   AST 16 10/21/2022 2032   ALT 21 10/21/2022 2032   ALKPHOS 77 10/21/2022 2032   BILITOT 0.7 10/21/2022 2032   GFRNONAA >60 10/22/2022 0753    Lipid Panel     Component Value Date/Time   CHOL 162 10/03/2022 1439   TRIG 129.0 10/03/2022 1439   HDL 51.70 10/03/2022 1439   CHOLHDL 3 10/03/2022 1439   VLDL 25.8 10/03/2022 1439   LDLCALC 85 10/03/2022 1439   LDLCALC 91 12/21/2021 1635    Lab Results  Component Value Date   HGBA1C 5.3 10/03/2022      Imaging I have reviewed the images obtained:  CT head: No acute intracranial process  Assessment: 52 year old veteran of the [employer]	[place] with a history of medically refractory epilepsy status post VNS placement currently on oxcarbazepine, Keppra and clobazam-follows with [facility] neurology, and other comorbidities as above-presented to the emergency department for a flurry of seizures after heat rolled his ankle and had a fall-unclear if there was any head injury.  Head CT was unremarkable.  He had more than 10 or 11 seizures which is way above his baseline since he has had VNS placement last July. Has not had a seizure for a few hours now. Pain is being controlled by pain meds Main concern is the right ankle pain and adequate pain control at this time. I think a flurry of seizures was likely a result of the acute pain and any kind of stressor is known to exacerbate seizure frequency in patients with difficult to control epilepsy, which he has. At this time, I would not recommend making any drastic changes to his antiepileptics since in my experience patients with difficult to control epilepsy are also very sensitive to medication changes.  Focus should be on  removing the triggers that have caused the flurry of seizures..    Impression: Breakthrough seizures in the setting of acute  ankle injury causing severe pain which probably has lowered seizure threshold  Recommendations: Continue home antiepileptic regimen Maintain seizure precautions I have requested to the charge nurse to make sure that he is in line to get a single room once 1 becomes available. Pain management per primary team Management of the ankle injury per primary team/orthopedics. Based on the exam, I do not see an EEG adding any value to his management hence I have canceled the EEG order. Follow-up with his neurologist outpatient soon after discharge in 1 to 2 weeks. Inpatient neurology will be available with questions as needed Plan was discussed with the primary hospitalist in person.  Plan was also discussed with the patient.   -- Milon Dikes, MD Neurologist [facility] Pager: [phone]

## 2022-10-23 DIAGNOSIS — R52 Pain, unspecified: Secondary | ICD-10-CM | POA: Diagnosis not present

## 2022-10-23 LAB — GLUCOSE, CAPILLARY: Glucose-Capillary: 146 mg/dL — ABNORMAL HIGH (ref 70–99)

## 2022-10-23 MED ORDER — MORPHINE SULFATE (PF) 2 MG/ML IV SOLN
2.0000 mg | INTRAVENOUS | Status: DC | PRN
  Administered 2022-10-23 – 2022-10-24 (×3): 2 mg via INTRAVENOUS
  Filled 2022-10-23 (×3): qty 1

## 2022-10-23 MED ORDER — OXYCODONE HCL 5 MG PO TABS
5.0000 mg | ORAL_TABLET | ORAL | Status: AC | PRN
  Administered 2022-10-23 (×2): 5 mg via ORAL
  Filled 2022-10-23 (×2): qty 1

## 2022-10-23 MED ORDER — CLOBAZAM 10 MG PO TABS
20.0000 mg | ORAL_TABLET | Freq: Once | ORAL | Status: AC
  Administered 2022-10-23: 20 mg via ORAL
  Filled 2022-10-23: qty 2

## 2022-10-23 MED ORDER — CLOBAZAM 10 MG PO TABS
20.0000 mg | ORAL_TABLET | Freq: Two times a day (BID) | ORAL | Status: DC
  Administered 2022-10-23 – 2022-11-01 (×18): 20 mg via ORAL
  Filled 2022-10-23 (×18): qty 2

## 2022-10-23 MED ORDER — TRAMADOL HCL 50 MG PO TABS
50.0000 mg | ORAL_TABLET | Freq: Four times a day (QID) | ORAL | Status: DC
  Administered 2022-10-23 (×2): 50 mg via ORAL
  Filled 2022-10-23 (×2): qty 1

## 2022-10-23 MED ORDER — CLOBAZAM 10 MG PO TABS
20.0000 mg | ORAL_TABLET | Freq: Every day | ORAL | Status: DC
  Administered 2022-10-23: 20 mg via ORAL
  Filled 2022-10-23: qty 2

## 2022-10-23 MED ORDER — OXYCODONE HCL 5 MG PO TABS: 5.0000 mg | ORAL_TABLET | ORAL | Status: DC | PRN

## 2022-10-23 NOTE — Significant Event (Signed)
Rapid Response Event Note   Reason for Call : patient having seizure   Initial Focused Assessment:  Patient is alert and oriented, visibly irritated and upset. Nurse and nurse tech at the bedside taking vital signs and getting a blood sugar. Patient was expressing much frustration with not getting medications, delays in care in the ED and in the unit. His pain was not being controlled well and he did not feel safe being here. The patient was emotional, speaking loudly, using profanity, and obviously upset.   Interventions: Pain meds administered, awaiting Onfi from pharmacy, spent near an hour with the patient listening to what he had to say about his stay. He feels he has been neglected, mainly by the ED, and mismanaged on the floor by neurology, the hospitalist, and pharmacy. He expressed much frustration about things in his room not working and originally being placed in a room with a roommate. He included that he now lives primarily in [place] but when he lived here full time he came here often and this experience has been a drastic difference. By the end of the hour, he expressed his pain had improved, apologized for his outburst and was thankful for my time spent listening to him.   Plan of Care: Pain to be managed, bipap provided, maintenance has fixed the lights. Patient resting comfortably with follow up.   Event Summary:   MD Notified: Dr. Para March Call Time:0015 Arrival Time:0017 End [id number]  Astrid Drafts, RN

## 2022-10-23 NOTE — Plan of Care (Signed)
  Problem: Education: Goal: Knowledge of General Education information will improve Description: Including pain rating scale, medication(s)/side effects and non-pharmacologic comfort measures Outcome: Progressing   Problem: Clinical Measurements: Goal: Ability to maintain clinical measurements within normal limits will improve Outcome: Progressing Goal: Cardiovascular complication will be avoided Outcome: Progressing   Problem: Elimination: Goal: Will not experience complications related to bowel motility Outcome: Progressing

## 2022-10-23 NOTE — Progress Notes (Signed)
PROGRESS NOTE    Randy Cordova.  ZSW:[id number] DOB: 03-04-1970 DOA: 10/21/2022 PCP: Eden Emms, NP    Brief Narrative:   Randy Tumminia. is a 52 y.o. male with history of TBI's, history of seizures secondary to TBI's, status post vagal nerve stimulator placement, morbid obesity, OSA on CPAP, hypertension, hyperlipidemia, presented to the hospital because of severe right foot pain and seizures.  He said he fell down the stairs after twisting his ankle as he was walking up the stairs.  He said he had a seizure shortly after the fall and this was witnessed.  He said he had about 11 seizures yesterday.  It was also reported that he had a brief seizure-like activity in the emergency department.     [name] for breakthrough seizures presumed secondary to decreased seizure threshold due to right foot pain   Assessment & Plan:   Principal Problem:   Inadequate pain control Active Problems:   Breakthrough seizures, recurrent (HCC)   OSA on CPAP   Epilepsy (HCC)   Morbid obesity with BMI of 60.0-69.9, adult (HCC)   Tobacco user   Primary hypertension   Osteoarthritis of both knees   Nonalcoholic fatty liver disease   Hyperlipidemia   History of traumatic brain injury   Generalized anxiety disorder   Arthralgia of hip   Foot pain, right Breakthrough seizures in a patient with refractory epilepsy who has a VNS in place: Consulted Dr. Wilford Corner, neurologist.  No changes to antiepileptics for now.  No diagnostic value and repeat EEG.  Resume home antiepileptic regimen     Right foot pain, right plantar fasciitis on x-ray of the ankle/foot: Patient still having substantial pain and need for narcotic administration.  Podiatry consulted.  At this time no plans for surgical intervention.  Will need skilled nursing facility.  Will attempt to optimize pain control  Morbid obesity BMI 62.85.  Complicates overall care and prognosis     Other comorbidities include  osteoarthritis left lower extremities, hypertension, hyperlipidemia, tobacco use disorder, OSA on CPAP    DVT prophylaxis: SQ heparin Code Status: Full Family Communication: None Disposition Plan: Status is: Inpatient Remains inpatient appropriate because: Intractable pain.  Need for skilled nursing facility.   Level of care: Telemetry Medical  Consultants:  Podiatry-signed off Neurology-signed off  Procedures:  None  Antimicrobials: None   Subjective: Seen and examined.  Resting in bed.  No visible distress.  Does not Dors severe pain right foot.  Objective: Vitals:   10/22/22 0835 10/22/22 0926 10/23/22 0019 10/23/22 1019  BP: (!) 155/83 (!) 156/75 (!) 145/77 126/78  Pulse: 83  85 78  Resp: 20 20  18   Temp: 97.7 F (36.5 C) 97.8 F (36.6 C) 97.6 F (36.4 C) 98.1 F (36.7 C)  TempSrc: Oral   Oral  SpO2: 100% 100% 99% 97%  Weight:      Height:        Intake/Output Summary (Last 24 hours) at 10/23/2022 1335 Last data filed at 10/23/2022 1035 Gross per 24 hour  Intake 360 ml  Output --  Net 360 ml   Filed Weights   10/21/22 1303  Weight: (!) 210.2 kg    Examination:  General exam: Appears calm and comfortable  Respiratory system: Clear to auscultation. Respiratory effort normal. Cardiovascular system: S1-S2, RRR, no murmurs, no pedal edema Gastrointestinal system: Obese, soft, NT/ND, normal bowel sounds Central nervous system: Alert and oriented. No focal neurological deficits. Extremities: Right foot wrapped  Skin: No rashes, lesions or ulcers Psychiatry: Judgement and insight appear normal. Mood & affect appropriate.     Data Reviewed: I have personally reviewed following labs and imaging studies  CBC: Recent Labs  Lab 10/22/22 0753  WBC 3.8*  HGB 12.9*  HCT 39.3  MCV 89.3  PLT 97*   Basic Metabolic Panel: Recent Labs  Lab 10/21/22 1327 10/22/22 0753  NA 135 133*  K 4.1 3.7  CL 106 101  CO2 23 24  GLUCOSE 90 142*  BUN 16 11   CREATININE 0.64 0.71  CALCIUM 8.3* 8.3*  MG 2.3  --    GFR: Estimated Creatinine Clearance: 201.8 mL/min (by C-G formula based on SCr of 0.71 mg/dL). Liver Function Tests: Recent Labs  Lab 10/21/22 2032  AST 16  ALT 21  ALKPHOS 77  BILITOT 0.7  PROT 6.4*  ALBUMIN 3.3*   No results for input(s): "LIPASE", "AMYLASE" in the last 168 hours. No results for input(s): "AMMONIA" in the last 168 hours. Coagulation Profile: No results for input(s): "INR", "PROTIME" in the last 168 hours. Cardiac Enzymes: No results for input(s): "CKTOTAL", "CKMB", "CKMBINDEX", "TROPONINI" in the last 168 hours. BNP (last 3 results) Recent Labs    10/03/22 1525  PROBNP 34.0   HbA1C: No results for input(s): "HGBA1C" in the last 72 hours. CBG: Recent Labs  Lab 10/21/22 1254 10/23/22 0017  GLUCAP 90 146*   Lipid Profile: No results for input(s): "CHOL", "HDL", "LDLCALC", "TRIG", "CHOLHDL", "LDLDIRECT" in the last 72 hours. Thyroid Function Tests: No results for input(s): "TSH", "T4TOTAL", "FREET4", "T3FREE", "THYROIDAB" in the last 72 hours. Anemia Panel: No results for input(s): "VITAMINB12", "FOLATE", "FERRITIN", "TIBC", "IRON", "RETICCTPCT" in the last 72 hours. Sepsis Labs: No results for input(s): "PROCALCITON", "LATICACIDVEN" in the last 168 hours.  No results found for this or any previous visit (from the past 240 hour(s)).       Radiology Studies: CT ANKLE RIGHT WO CONTRAST  Result Date: 10/22/2022 CLINICAL DATA:  Right ankle pain, twisting injury EXAM: CT OF THE RIGHT ANKLE WITHOUT CONTRAST TECHNIQUE: Multidetector CT imaging of the right ankle was performed according to the standard protocol. Multiplanar CT image reconstructions were also generated. RADIATION DOSE REDUCTION: This exam was performed according to the departmental dose-optimization program which includes automated exposure control, adjustment of the mA and/or kV according to patient size and/or use of iterative  reconstruction technique. COMPARISON:  X-ray 10/21/2022 FINDINGS: Bones/Joint/Cartilage Bones of the right ankle, hindfoot, and midfoot are intact. No acute fracture. No malalignment. Mild-to-moderate osteoarthritic changes with joint space narrowing and marginal osteophyte formation. Bidirectional calcaneal enthesophytes. Large ossification along the course of the peroneus longus tendon sheath at the level of the peroneal tubercle measuring 1.8 x 1.4 cm. Calcification within the proximal plantar fascia. No tibiotalar or subtalar joint effusion. Ligaments Suboptimally assessed by CT. Muscles and Tendons No acute musculotendinous abnormality by CT. 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 No tenosynovial fluid collections. Soft tissues Soft tissue swelling most pronounced along the lateral aspect of the hindfoot and midfoot. No organized fluid collection or hematoma. IMPRESSION: 1. No acute fracture or malalignment of the right ankle. 2. Soft tissue swelling most pronounced along the lateral aspect of the hindfoot and midfoot. No organized fluid collection or hematoma. 3. Large ossification along the course of the right peroneus longus tendon sheath at the level of the peroneal tubercle measuring 1.8 x 1.4 cm. This is more proximally located than a typical os  peroneum. 4. Mild-to-moderate osteoarthritic changes of the right foot and ankle. Electronically Signed   By: Duanne Guess D.O.   On: 10/22/2022 19:16   CT Head Wo Contrast  Result Date: 10/21/2022 CLINICAL DATA:  Larey Seat, seizure EXAM: CT HEAD WITHOUT CONTRAST TECHNIQUE: Contiguous axial images were obtained from the base of the skull through the vertex without intravenous contrast. RADIATION DOSE REDUCTION: This exam was performed according to the departmental dose-optimization program which includes automated exposure control, adjustment of the mA and/or kV according to patient size and/or use of iterative  reconstruction technique. COMPARISON:  12/04/2020 FINDINGS: Brain: No acute infarct or hemorrhage. Lateral ventricles and midline structures are unremarkable. No acute extra-axial fluid collections. No mass effect. Vascular: No hyperdense vessel or unexpected calcification. Skull: Normal. Negative for fracture or focal lesion. Sinuses/Orbits: No acute finding. Other: None. IMPRESSION: 1. Stable head CT, no acute intracranial process. Electronically Signed   By: Sharlet Salina M.D.   On: 10/21/2022 16:21   DG Foot Complete Right  Result Date: 10/21/2022 CLINICAL DATA:  Post fall, now with lateral sided foot and ankle pain. EXAM: RIGHT FOOT COMPLETE - 3+ VIEW COMPARISON:  Ankle radiographs earlier same day FINDINGS: There is diffuse soft tissue swelling about the imaged lower leg, ankle and hindfoot. Note is made of an os peroneus. No definite displaced acute fracture. Mild degenerative change of several midfoot articulations. Joint spaces appear otherwise well preserved. No significant hallux valgus deformity. No erosions. Pes cavus deformity is suspected on this nonweightbearing radiographs. Small plantar calcaneal spur. Dystrophic calcifications involving the posterior aspect of the plantar fascia. Enthesopathic change involving the Achilles tendon insertion site. Dermal calcifications overlie the anterior aspect of the lower leg. No radiopaque foreign body. IMPRESSION: 1. Diffuse soft tissue swelling about the imaged lower leg, ankle and hindfoot without underlying displaced acute fracture or dislocation. 2. Pes cavus deformity is suspected on this nonweightbearing radiographs. 3. Small plantar calcaneal spur with dystrophic calcifications involving the posterior aspect of the plantar fascia, nonspecific though could be seen in the setting of plantar fasciitis. Electronically Signed   By: Simonne Come M.D.   On: 10/21/2022 15:06   DG Ankle Complete Right  Result Date: 10/21/2022 CLINICAL DATA:  Post fall,  now with lateral sided foot and ankle pain. EXAM: RIGHT ANKLE - COMPLETE 3+ VIEW COMPARISON:  Right foot radiographs earlier same day FINDINGS: Mild soft tissue swelling about the imaged lower leg, ankle and hindfoot. Peripherally corticated ossicles adjacent to the medial malleolus likely represent the sequela of remote avulsive injury. Note is made of a os peroneus. No acute fracture or dislocation. Joint spaces appear preserved. The ankle mortise appears preserved. No ankle joint effusion. Small plantar calcaneal spur with dystrophic calcification overlying expected location of the posterior aspect of the plantar fascia. Enthesopathic change involving the Achilles tendon insertion site. Dermal calcifications overlie the anterior aspect of the lower leg. IMPRESSION: 1. Mild soft tissue swelling about the imaged lower leg, ankle and hindfoot without associated acute fracture or dislocation. 2. Small plantar calcaneal spur with dystrophic calcification overlying expected location of the posterior aspect of the plantar fascia, nonspecific though could be seen in the setting of plantar fasciitis. Electronically Signed   By: Simonne Come M.D.   On: 10/21/2022 15:04        Scheduled Meds:  atorvastatin  20 mg Oral Daily   cloBAZam  20 mg Oral BID   heparin  5,000 Units Subcutaneous Q8H   levETIRAcetam  1,500 mg Oral  BID   meloxicam  15 mg Oral Daily   oxcarbazepine  1,200 mg Oral BID   pyridoxine  200 mg Oral Daily   traMADol  50 mg Oral Q6H   Continuous Infusions:   LOS: 2 days     Randy Moore, MD [facility]   If 7PM-7AM, please contact night-coverage  10/23/2022, 1:35 PM

## 2022-10-23 NOTE — Progress Notes (Signed)
Mobility Specialist - Progress Note   10/23/22 1514  Mobility  Activity Dangled on edge of bed;Stood at bedside;Ambulated with assistance to bathroom  Level of Assistance Minimal assist, patient does 75% or more  Assistive Device Front wheel walker  Distance Ambulated (ft) 12 ft  RLE Weight Bearing WBAT  Activity Response Tolerated well  $Mobility charge 1 Mobility  Mobility Specialist Start Time (ACUTE ONLY) 1455  Mobility Specialist Stop Time (ACUTE ONLY) 1509  Mobility Specialist Time Calculation (min) (ACUTE ONLY) 14 min   Pt in fowler position upon entry, utilizing RA. Pt requesting to amb to the bathroom. Pt completed bed mob indep, STS to RW MinA +2 with bed elevated. Pt amb to the bathroom MinA +2 for safety, requiring VC's for weight shifting. Pt left seated on the commode with RN present.  Zetta Bills Mobility Specialist 10/23/22 3:25 PM

## 2022-10-24 DIAGNOSIS — R52 Pain, unspecified: Secondary | ICD-10-CM | POA: Diagnosis not present

## 2022-10-24 MED ORDER — KETOROLAC TROMETHAMINE 15 MG/ML IJ SOLN
15.0000 mg | Freq: Four times a day (QID) | INTRAMUSCULAR | Status: DC
Start: 2022-10-24 — End: 2022-10-24

## 2022-10-24 MED ORDER — MORPHINE SULFATE (PF) 2 MG/ML IV SOLN
2.0000 mg | INTRAVENOUS | Status: DC | PRN
  Administered 2022-10-24 – 2022-11-01 (×5): 2 mg via INTRAVENOUS
  Filled 2022-10-24 (×5): qty 1

## 2022-10-24 MED ORDER — KETOROLAC TROMETHAMINE 15 MG/ML IJ SOLN
15.0000 mg | Freq: Four times a day (QID) | INTRAMUSCULAR | Status: AC
  Administered 2022-10-24 – 2022-10-29 (×19): 15 mg via INTRAVENOUS
  Filled 2022-10-24 (×20): qty 1

## 2022-10-24 MED ORDER — HYDROCODONE-ACETAMINOPHEN 5-325 MG PO TABS
1.0000 | ORAL_TABLET | ORAL | Status: DC | PRN
  Administered 2022-10-24 – 2022-10-28 (×6): 2 via ORAL
  Administered 2022-10-28: 1 via ORAL
  Administered 2022-10-30 – 2022-11-01 (×3): 2 via ORAL
  Filled 2022-10-24 (×3): qty 2
  Filled 2022-10-24: qty 1
  Filled 2022-10-24 (×6): qty 2

## 2022-10-24 NOTE — Progress Notes (Signed)
Spoke with patient this AM. Discussed findings of CT. Affirmed no plans for emergent surgery or surgery while inpatient. Patient would benefit from rehab due to inability to be completely NWB and lowered threshold for seizure.   Recommend NWB 4 weeks. F/u in office 4 weeks.   [name], DPM [facility]  Dr. [name], DPM    [address] [place], [place] [address]                Office [phone]  Fax [phone]

## 2022-10-24 NOTE — Progress Notes (Signed)
Physical Therapy Treatment Patient Details Name: Randy Cordova. MRN: [id number] DOB: [birth year] Today's Date: 10/24/2022   History of Present Illness 52 y/o male presented to ED on 10/21/22 for ankle pain and seizure activity. Multiple seizures in ED. X-ray not suggestive of R ankle fx. PMH: vagal nerve stimulator 08/2021, hx of seizures 2/2 TBI, OSA on CPAP, HTN    PT Comments  Pt resting in bed upon PT arrival; pt agreeable to therapy.  Pt educated on NWB'ing status.  During session pt SBA with bed mobility and demonstrating good sitting balance.  Pt reported he was unable to try standing (even with bed height elevated) without putting weight on R LE so deferred standing attempt (d/t pt NWB'ing).  Pt SBA lateral scooting to R along bed (vc's for NWB'ing status and technique) with bed height elevated.  Will continue to focus on strengthening and progressive functional mobility during hospitalization (anticipate focus on w/c level transfers/mobility unless WB'ing upgraded).   If plan is discharge home, recommend the following: A little help with bathing/dressing/bathroom;Assistance with cooking/housework;Help with stairs or ramp for entrance;Two people to help with walking and/or transfers;Assist for transportation   Can travel by private vehicle      No  Equipment Recommendations  Rolling walker (2 wheels) (bariatric)    Recommendations for Other Services       Precautions / Restrictions Precautions Precautions: Other (comment) Precaution Comments: Seizure (has VNS device); R ankle pain (in splint) Required Braces or Orthoses: Splint/Cast Splint/Cast: RLE Restrictions Weight Bearing Restrictions: Yes RLE Weight Bearing: Non weight bearing Other Position/Activity Restrictions: NWB R LE x4 weeks per Podiatry note 10/24/22     Mobility  Bed Mobility Overal bed mobility: Needs Assistance Bed Mobility: Supine to Sit, Sit to Supine     Supine to sit: Supervision Sit to supine:  Supervision   General bed mobility comments: increased time and effort but able to complete without assistance    Transfers Overall transfer level: Needs assistance Equipment used: Rolling walker (2 wheels), None (bariatric) Transfers: Bed to chair/wheelchair/BSC            Lateral/Scoot Transfers: Supervision General transfer comment: therapist set pt up to attempt to stand from bed (bed height elevated) but pt reported he was unable to try standing without putting weight on R LE so deferred standing attempt (d/t pt NWB'ing); SBA lateral scooting to R along bed (vc's for NWB'ing status and technique) with bed height elevated    Ambulation/Gait                   Stairs             Wheelchair Mobility     Tilt Bed    Modified Rankin (Stroke Patients Only)       Balance Overall balance assessment: Needs assistance Sitting-balance support: No upper extremity supported, Feet supported Sitting balance-Leahy Scale: Good Sitting balance - Comments: steady reaching within BOS                                    Cognition Arousal: Alert Behavior During Therapy: WFL for tasks assessed/performed Overall Cognitive Status: Within Functional Limits for tasks assessed                                 General Comments: Talkative        Exercises  General Comments General comments (skin integrity, edema, etc.): R LE splint in place.  Nursing cleared pt for participation in physical therapy.  Pt agreeable to PT session.      Pertinent Vitals/Pain Pain Assessment Pain Assessment: 0-10 Pain Score: 10-Worst pain ever Pain Location: R ankle Pain Descriptors / Indicators: Constant, Aching, Discomfort, Grimacing, Guarding Pain Intervention(s): Limited activity within patient's tolerance, Monitored during session, Premedicated before session, Repositioned, Other (comment) (RN notified of pt's 10/10 pain) Vitals (HR and SpO2 on room  air) stable and WFL throughout treatment session.    Home Living                          Prior Function            PT Goals (current goals can now be found in the care plan section) Acute Rehab PT Goals Patient Stated Goal: to figure out what's wrong with my foot PT Goal Formulation: With patient Time For Goal Achievement: 11/05/22 Potential to Achieve Goals: Fair Progress towards PT goals: Progressing toward goals    Frequency    Min 1X/week      PT Plan      Co-evaluation              AM-PAC PT "6 Clicks" Mobility   Outcome Measure  Help needed turning from your back to your side while in a flat bed without using bedrails?: A Little Help needed moving from lying on your back to sitting on the side of a flat bed without using bedrails?: A Little Help needed moving to and from a bed to a chair (including a wheelchair)?: Total Help needed standing up from a chair using your arms (e.g., wheelchair or bedside chair)?: Total Help needed to walk in hospital room?: Total Help needed climbing 3-5 steps with a railing? : Total 6 Click Score: 10    End of Session Equipment Utilized During Treatment: Gait belt Activity Tolerance: Patient limited by pain Patient left: in bed;with call bell/phone within reach;Other (comment) (R LE elevated on pillows; pt refused bed alarm) Nurse Communication: Mobility status;Precautions;Other (comment) (Pt's pain status) PT Visit Diagnosis: Unsteadiness on feet (R26.81);Muscle weakness (generalized) (M62.81);Other abnormalities of gait and mobility (R26.89)     Time: 4010-2725 PT Time Calculation (min) (ACUTE ONLY): 31 min  Charges:    $Therapeutic Activity: 23-37 mins PT General Charges $$ ACUTE PT VISIT: 1 Visit                     Hendricks Limes, PT 10/24/22, 12:58 PM

## 2022-10-24 NOTE — Plan of Care (Signed)
Problem: Education: Goal: Knowledge of General Education information will improve Description: Including pain rating scale, medication(s)/side effects and non-pharmacologic comfort measures Outcome: Progressing   Problem: Health Behavior/Discharge Planning: Goal: Ability to manage health-related needs will improve Outcome: Progressing   Problem: Clinical Measurements: Goal: Cardiovascular complication will be avoided Outcome: Progressing   Problem: Coping: Goal: Level of anxiety will decrease Outcome: Progressing

## 2022-10-24 NOTE — Progress Notes (Signed)
PROGRESS NOTE    Randy Cordova.  WUJ:[id number] DOB: 09/01/1970 DOA: 10/21/2022 PCP: Randy Emms, NP    Brief Narrative:   Randy Cordova. is a 52 y.o. male with history of TBI's, history of seizures secondary to TBI's, status post vagal nerve stimulator placement, morbid obesity, OSA on CPAP, hypertension, hyperlipidemia, presented to the hospital because of severe right foot pain and seizures.  He said he fell down the stairs after twisting his ankle as he was walking up the stairs.  He said he had a seizure shortly after the fall and this was witnessed.  He said he had about 11 seizures yesterday.  It was also reported that he had a brief seizure-like activity in the emergency department.     [name] for breakthrough seizures presumed secondary to decreased seizure threshold due to right foot pain   Assessment & Plan:   Principal Problem:   Inadequate pain control Active Problems:   Breakthrough seizures, recurrent (HCC)   OSA on CPAP   Epilepsy (HCC)   Morbid obesity with BMI of 60.0-69.9, adult (HCC)   Tobacco user   Primary hypertension   Osteoarthritis of both knees   Nonalcoholic fatty liver disease   Hyperlipidemia   History of traumatic brain injury   Generalized anxiety disorder   Arthralgia of hip   Foot pain, right Breakthrough seizures in a patient with refractory epilepsy who has a VNS in place: Consulted Dr. Wilford Corner, neurologist.  No changes to antiepileptics for now.  No diagnostic value and repeat EEG.  No breakthrough seizures noted.  Okay to resume home antiepileptic regimen.  Changed Onfi to twice daily     Right foot pain, right plantar fasciitis on x-ray of the ankle/foot: Patient still having substantial pain and need for narcotic administration.  Podiatry consulted.  Case was reviewed between emergent surgery or surgery while inpatient.  Patient will benefit from rehab.  Nonweightbearing.  Cam boot ordered.  Multimodal pain  control.  Follow-up in office 4 weeks  Morbid obesity BMI 62.85.  Complicates overall care and prognosis     Other comorbidities include osteoarthritis left lower extremities, hypertension, hyperlipidemia, tobacco use disorder, OSA on CPAP    DVT prophylaxis: SQ heparin Code Status: Full Family Communication: None Disposition Plan: Status is: Inpatient Remains inpatient appropriate because: Intractable pain.  Need for skilled nursing facility.   Level of care: Telemetry Medical  Consultants:  Podiatry-signed off Neurology-signed off  Procedures:  None  Antimicrobials: None   Subjective: Seen and.  Resting comfortably in bed.  No visible distress.  Continues to endorse pain in right lower extremity.  Objective: Vitals:   10/23/22 1640 10/23/22 1946 10/24/22 0404 10/24/22 0915  BP: 135/67 121/75 113/65 (!) 158/75  Pulse: 81 85 80 87  Resp:  20 20 18   Temp: 98.1 F (36.7 C) 98.7 F (37.1 C) 97.8 F (36.6 C) (!) 97.5 F (36.4 C)  TempSrc:  Oral Oral Oral  SpO2: 98% 98% 98% 100%  Weight:      Height:        Intake/Output Summary (Last 24 hours) at 10/24/2022 1522 Last data filed at 10/24/2022 9562 Gross per 24 hour  Intake --  Output 900 ml  Net -900 ml   Filed Weights   10/21/22 1303  Weight: (!) 210.2 kg    Examination:  General exam: No acute distress Respiratory system: Lungs clear.  Normal work of breathing.  Room air Cardiovascular system: S1-S2, RRR, no  murmurs, no pedal edema Gastrointestinal system: Obese, soft, NT/ND, normal bowel sounds Central nervous system: Alert and oriented. No focal neurological deficits. Extremities: Right foot in surgical wraps.  Not removed Skin: No rashes, lesions or ulcers Psychiatry: Judgement and insight appear normal. Mood & affect appropriate.     Data Reviewed: I have personally reviewed following labs and imaging studies  CBC: Recent Labs  Lab 10/22/22 0753  WBC 3.8*  HGB 12.9*  HCT 39.3  MCV  89.3  PLT 97*   Basic Metabolic Panel: Recent Labs  Lab 10/21/22 1327 10/22/22 0753  NA 135 133*  K 4.1 3.7  CL 106 101  CO2 23 24  GLUCOSE 90 142*  BUN 16 11  CREATININE 0.64 0.71  CALCIUM 8.3* 8.3*  MG 2.3  --    GFR: Estimated Creatinine Clearance: 201.8 mL/min (by C-G formula based on SCr of 0.71 mg/dL). Liver Function Tests: Recent Labs  Lab 10/21/22 2032  AST 16  ALT 21  ALKPHOS 77  BILITOT 0.7  PROT 6.4*  ALBUMIN 3.3*   No results for input(s): "LIPASE", "AMYLASE" in the last 168 hours. No results for input(s): "AMMONIA" in the last 168 hours. Coagulation Profile: No results for input(s): "INR", "PROTIME" in the last 168 hours. Cardiac Enzymes: No results for input(s): "CKTOTAL", "CKMB", "CKMBINDEX", "TROPONINI" in the last 168 hours. BNP (last 3 results) Recent Labs    10/03/22 1525  PROBNP 34.0   HbA1C: No results for input(s): "HGBA1C" in the last 72 hours. CBG: Recent Labs  Lab 10/21/22 1254 10/23/22 0017  GLUCAP 90 146*   Lipid Profile: No results for input(s): "CHOL", "HDL", "LDLCALC", "TRIG", "CHOLHDL", "LDLDIRECT" in the last 72 hours. Thyroid Function Tests: No results for input(s): "TSH", "T4TOTAL", "FREET4", "T3FREE", "THYROIDAB" in the last 72 hours. Anemia Panel: No results for input(s): "VITAMINB12", "FOLATE", "FERRITIN", "TIBC", "IRON", "RETICCTPCT" in the last 72 hours. Sepsis Labs: No results for input(s): "PROCALCITON", "LATICACIDVEN" in the last 168 hours.  No results found for this or any previous visit (from the past 240 hour(s)).       Radiology Studies: No results found.      Scheduled Meds:  atorvastatin  20 mg Oral Daily   cloBAZam  20 mg Oral BID   heparin  5,000 Units Subcutaneous Q8H   ketorolac  15 mg Intravenous Q6H   levETIRAcetam  1,500 mg Oral BID   oxcarbazepine  1,200 mg Oral BID   pyridoxine  200 mg Oral Daily   Continuous Infusions:   LOS: 3 days     [name], MD [facility]   If 7PM-7AM, please contact night-coverage  10/24/2022, 3:22 PM

## 2022-10-24 NOTE — Progress Notes (Signed)
Occupational Therapy Treatment Patient Details Name: Randy Cordova. MRN: [id number] DOB: 1970-02-27 Today's Date: 10/24/2022   History of present illness 52 y/o male presented to ED on 10/21/22 for ankle pain and seizure activity. Multiple seizures in ED. X-ray not suggestive of R ankle fx. PMH: vagal nerve stimulator 08/2021, hx of seizures 2/2 TBI, OSA on CPAP, HTN   OT comments  Pt seen for OT tx. Pt agreeable despite pain. Pt educated in new WBing restriction and need for CAM boot with mobility to maximize safety and optimize pain mgt. Pt verbalized understanding. Pt initially agreeable to go to bathroom but then declines OOB when he reported he no longer needed to go. Trialed size L CAM boot but was not large enough for appropriate positioning/fit. Coordinated for XL to be delivered to unit later today. Pt's RN and charge RN notified. Pt expressed frustrations and shared goals for hospital stay, active listening and emotional support provided. Pt limited this date due to pain and pending CAM boot to trial safe WBAT on RLE. Will continue to progress.       If plan is discharge home, recommend the following:  A lot of help with walking and/or transfers;A little help with bathing/dressing/bathroom;Assistance with cooking/housework;Assist for transportation;Help with stairs or ramp for entrance   Equipment Recommendations  Other (comment) (defer to next venue)    Recommendations for Other Services      Precautions / Restrictions Precautions Precautions: Fall;Other (comment) Precaution Comments: Seizure (has VNS device); R ankle pain (in splint) Required Braces or Orthoses: Splint/Cast;Other Brace Splint/Cast: RLE Other Brace: per Dr. Logan Bores 9/19, now RLE WBAT in CAM boot Restrictions Weight Bearing Restrictions: Yes RLE Weight Bearing: Weight bearing as tolerated Other Position/Activity Restrictions: per Dr. Logan Bores 9/19, now RLE WBAT in CAM boot       Mobility Bed Mobility                General bed mobility comments: declined    Transfers                   General transfer comment: deferred, CAM boot (size L) delivered but did not fit. XL CAM boot to be delivered this evening.     Balance                                           ADL either performed or assessed with clinical judgement   ADL                                              Extremity/Trunk Assessment              Vision       Perception     Praxis      Cognition Arousal: Alert Behavior During Therapy: WFL for tasks assessed/performed Overall Cognitive Status: Within Functional Limits for tasks assessed                                 General Comments: Talkative        Exercises Other Exercises Other Exercises: Pt educated in new WBing restriction and need for CAM boot with mobility to maximize safety and optimize pain mgt.  Other Exercises: Trialed size L CAM boot but was not large enough for appropriate positioning/fit. Coordinated for XL to be delivered. Other Exercises: Pt expressed frustrations and shared goals for hospital stay, active listening and emotional support provided.    Shoulder Instructions       General Comments R LE splint in place    Pertinent Vitals/ Pain       Pain Assessment Pain Assessment: 0-10 Pain Score: 10-Worst pain ever Pain Location: R ankle Pain Descriptors / Indicators: Constant, Aching, Discomfort, Guarding Pain Intervention(s): Limited activity within patient's tolerance, Monitored during session  Home Living                                          Prior Functioning/Environment              Frequency  Min 1X/week        Progress Toward Goals  OT Goals(current goals can now be found in the care plan section)  Progress towards OT goals: OT to reassess next treatment  Acute Rehab OT Goals Patient Stated Goal: go home with my wife OT  Goal Formulation: With patient Time For Goal Achievement: 11/05/22 Potential to Achieve Goals: Good  Plan      Co-evaluation                 AM-PAC OT "6 Clicks" Daily Activity     Outcome Measure   Help from another person eating meals?: None Help from another person taking care of personal grooming?: None Help from another person toileting, which includes using toliet, bedpan, or urinal?: A Little Help from another person bathing (including washing, rinsing, drying)?: A Little Help from another person to put on and taking off regular upper body clothing?: A Little Help from another person to put on and taking off regular lower body clothing?: A Little 6 Click Score: 20    End of Session    OT Visit Diagnosis: Pain;Unsteadiness on feet (R26.81);Other abnormalities of gait and mobility (R26.89);History of falling (Z91.81) Pain - Right/Left: Right Pain - part of body: Ankle and joints of foot   Activity Tolerance Patient limited by pain   Patient Left in bed;with call bell/phone within reach;with bed alarm set   Nurse Communication Mobility status;Other (comment) (charge RN and RN - XL CAM boot to be delivered)        Time: 1421-1500 OT Time Calculation (min): 39 min  Charges: OT General Charges $OT Visit: 1 Visit OT Treatments $Therapeutic Activity: 23-37 mins  Arman Filter., MPH, MS, OTR/L ascom [phone] 10/24/22, 3:34 PM

## 2022-10-25 ENCOUNTER — Other Ambulatory Visit: Payer: Self-pay

## 2022-10-25 DIAGNOSIS — R52 Pain, unspecified: Secondary | ICD-10-CM | POA: Diagnosis not present

## 2022-10-25 MED ORDER — SENNOSIDES-DOCUSATE SODIUM 8.6-50 MG PO TABS
1.0000 | ORAL_TABLET | Freq: Two times a day (BID) | ORAL | Status: DC
  Administered 2022-10-25 – 2022-10-29 (×5): 1 via ORAL
  Filled 2022-10-25 (×13): qty 1

## 2022-10-25 NOTE — Progress Notes (Signed)
Physical Therapy Treatment Patient Details Name: Randy Cordova. MRN: [id number] DOB: 05-May-1970 Today's Date: 10/25/2022   History of Present Illness 52 y/o male presented to ED on 10/21/22 for ankle pain and seizure activity. Multiple seizures in ED. X-ray not suggestive of R ankle fx. PMH: vagal nerve stimulator 08/2021, hx of seizures 2/2 TBI, OSA on CPAP, HTN    PT Comments  Pt resting in bed upon PT arrival; R LE splint already removed (by nursing); PT donned R LE CAM boot (new orders for WBAT R LE in CAM boot).  During session pt SBA with bed mobility; min assist to stand from significantly elevated bed height; and min assist to take a couple steps forward/backwards with bariatric RW use.  Limited session d/t pt reporting having aura symptoms that were triggered by pain (symptoms leading up to a seizure) which included word finding difficulty and L eye noted to be droopy (pt reports these are typical aura symptoms for him).  Pt assisted back to bed to rest and nursing notified (of seizure concerns) and came to assess pt.  Pt reporting 10/10 R ankle pain but reported it was a "good" type of pain and declining pain meds (d/t pt anticipating pain would improve with rest)--nurse notified.  Pt also reporting aura symptoms improving by end of session.  Will continue to focus on strengthening and progressive functional mobility during hospitalization.   If plan is discharge home, recommend the following: A little help with bathing/dressing/bathroom;Assistance with cooking/housework;Help with stairs or ramp for entrance;Two people to help with walking and/or transfers;Assist for transportation   Can travel by private vehicle      No  Equipment Recommendations  Rolling walker (2 wheels) (bariatric)    Recommendations for Other Services       Precautions / Restrictions Precautions Precautions: Fall;Other (comment) Precaution Comments: Seizure (has VNS device); R ankle pain (in CAM  boot) Required Braces or Orthoses: Other Brace Other Brace: Per Dr. Logan Bores 9/19, now RLE WBAT in CAM boot Restrictions Weight Bearing Restrictions: Yes RLE Weight Bearing: Weight bearing as tolerated Other Position/Activity Restrictions: Per Dr. Logan Bores 9/19, now RLE WBAT in CAM boot     Mobility  Bed Mobility Overal bed mobility: Needs Assistance Bed Mobility: Supine to Sit, Sit to Supine     Supine to sit: Supervision, HOB elevated Sit to supine: Supervision, HOB elevated   General bed mobility comments: increased effort to perform on own; use of bed rail    Transfers Overall transfer level: Needs assistance Equipment used: Rolling walker (2 wheels) (bariatric) Transfers: Sit to/from Stand Sit to Stand: Min assist, From elevated surface           General transfer comment: significantly elevated bed height; increased effort to stand    Ambulation/Gait Ambulation/Gait assistance: Min assist Gait Distance (Feet):  (pt took a couple steps forwards and then backwards with RW) Assistive device: Rolling walker (2 wheels) (bariatric)   Gait velocity: decreased     General Gait Details: antalgic; decreased stance time R LE   Stairs             Wheelchair Mobility     Tilt Bed    Modified Rankin (Stroke Patients Only)       Balance Overall balance assessment: Needs assistance Sitting-balance support: No upper extremity supported, Feet supported Sitting balance-Leahy Scale: Good Sitting balance - Comments: steady reaching within BOS   Standing balance support: Bilateral upper extremity supported, Reliant on assistive device for balance Standing balance-Leahy Scale:  Fair Standing balance comment: steady static standing with B UE support on walker                            Cognition Arousal: Alert Behavior During Therapy: WFL for tasks assessed/performed Overall Cognitive Status: Within Functional Limits for tasks assessed                                  General Comments: Talkative        Exercises      General Comments General comments (skin integrity, edema, etc.): R LE CAM boot donned.  Nursing cleared pt for participation in physical therapy.  Pt agreeable to PT session.      Pertinent Vitals/Pain Pain Assessment Pain Assessment: 0-10 Pain Score: 10-Worst pain ever Pain Location: R ankle Pain Descriptors / Indicators: Constant, Aching, Discomfort, Guarding Pain Intervention(s): Limited activity within patient's tolerance, Monitored during session, Premedicated before session, Repositioned, Other (comment) (pt declining pain meds--nurse notified)    Home Living                          Prior Function            PT Goals (current goals can now be found in the care plan section) Acute Rehab PT Goals Patient Stated Goal: to improve mobility PT Goal Formulation: With patient Time For Goal Achievement: 11/05/22 Potential to Achieve Goals: Fair Progress towards PT goals: Progressing toward goals    Frequency    Min 1X/week      PT Plan      Co-evaluation              AM-PAC PT "6 Clicks" Mobility   Outcome Measure  Help needed turning from your back to your side while in a flat bed without using bedrails?: A Little Help needed moving from lying on your back to sitting on the side of a flat bed without using bedrails?: A Little Help needed moving to and from a bed to a chair (including a wheelchair)?: Total Help needed standing up from a chair using your arms (e.g., wheelchair or bedside chair)?: Total Help needed to walk in hospital room?: Total Help needed climbing 3-5 steps with a railing? : Total 6 Click Score: 10    End of Session Equipment Utilized During Treatment: Gait belt Activity Tolerance: Patient limited by pain;Other (comment) (Limited d/t pt reporting aura (which could lead to seizure)) Patient left: in bed;with call bell/phone within reach (pt  declining bed alarm) Nurse Communication: Mobility status;Precautions;Other (comment) (pt's pain status; pt's aura symptoms/concerns for seizure) PT Visit Diagnosis: Unsteadiness on feet (R26.81);Muscle weakness (generalized) (M62.81);Other abnormalities of gait and mobility (R26.89)     Time: 5409-8119 PT Time Calculation (min) (ACUTE ONLY): 40 min  Charges:    $Therapeutic Activity: 38-52 mins PT General Charges $$ ACUTE PT VISIT: 1 Visit                     Hendricks Limes, PT 10/25/22, 5:41 PM

## 2022-10-25 NOTE — Progress Notes (Signed)
PROGRESS NOTE    Randy Cordova.  OZH:[id number] DOB: 1970/03/29 DOA: 10/21/2022 PCP: Eden Emms, NP    Brief Narrative:   Randy Cordova. is a 52 y.o. male with history of TBI's, history of seizures secondary to TBI's, status post vagal nerve stimulator placement, morbid obesity, OSA on CPAP, hypertension, hyperlipidemia, presented to the hospital because of severe right foot pain and seizures.  He said he fell down the stairs after twisting his ankle as he was walking up the stairs.  He said he had a seizure shortly after the fall and this was witnessed.  He said he had about 11 seizures yesterday.  It was also reported that he had a brief seizure-like activity in the emergency department.   [name] for breakthrough seizures presumed secondary to decreased seizure threshold due to right foot pain   Assessment & Plan:   Principal Problem:   Inadequate pain control Active Problems:   Breakthrough seizures, recurrent (HCC)   OSA on CPAP   Epilepsy (HCC)   Morbid obesity with BMI of 60.0-69.9, adult (HCC)   Tobacco user   Primary hypertension   Osteoarthritis of both knees   Nonalcoholic fatty liver disease   Hyperlipidemia   History of traumatic brain injury   Generalized anxiety disorder   Arthralgia of hip   Foot pain, right Breakthrough seizures in a patient with refractory epilepsy who has a VNS in place: Consulted Dr. Wilford Corner, neurologist.  No changes to antiepileptics for now.  No diagnostic value and repeat EEG.  Continue current anti-epileptic regimen     Right foot pain, right plantar fasciitis on x-ray of the ankle/foot: Patient still having substantial pain and need for narcotic administration.  Podiatry consulted.  Case was reviewed between emergent surgery or surgery while inpatient.  Patient will benefit from rehab.  Nonweightbearing.  Cam boot ordered.  Multimodal pain control.  Follow-up in office 4 weeks.  Anticipate readiness for dc in  48h  Morbid obesity BMI 62.85.  Complicates overall care and prognosis     Other comorbidities include osteoarthritis left lower extremities, hypertension, hyperlipidemia, tobacco use disorder, OSA on CPAP    DVT prophylaxis: SQ heparin Code Status: Full Family Communication: None Disposition Plan: Status is: Inpatient Remains inpatient appropriate because: Intractable pain.  Need for skilled nursing facility.   Level of care: Telemetry Medical  Consultants:  Podiatry-signed off Neurology-signed off  Procedures:  None  Antimicrobials: None   Subjective: Seen and examined.  No acute overnight events  Objective: Vitals:   10/24/22 1710 10/24/22 1958 10/25/22 0358 10/25/22 0817  BP: 131/62 132/65 127/74 127/69  Pulse: 80 83 79 73  Resp: 18 19 (!) 24 20  Temp: 98 F (36.7 C) 98 F (36.7 C) 98.4 F (36.9 C) 97.9 F (36.6 C)  TempSrc:  Oral Oral   SpO2: 98% 96% 100% 94%  Weight:      Height:        Intake/Output Summary (Last 24 hours) at 10/25/2022 1314 Last data filed at 10/24/2022 2329 Gross per 24 hour  Intake --  Output 400 ml  Net -400 ml   Filed Weights   10/21/22 1303  Weight: (!) 210.2 kg    Examination:  General exam: NAD Respiratory system: Lungs clear.  Normal work of breathing.  Room air Cardiovascular system: S1-S2, RRR, no murmurs, no pedal edema Gastrointestinal system: Obese, soft, NT/ND, normal bowel sounds Central nervous system: Alert and oriented. No focal neurological deficits. Extremities:  Right foot in surgical wraps.  Not removed.  Swelling decreased Skin: No rashes, lesions or ulcers Psychiatry: Judgement and insight appear normal. Mood & affect appropriate.     Data Reviewed: I have personally reviewed following labs and imaging studies  CBC: Recent Labs  Lab 10/22/22 0753  WBC 3.8*  HGB 12.9*  HCT 39.3  MCV 89.3  PLT 97*   Basic Metabolic Panel: Recent Labs  Lab 10/21/22 1327 10/22/22 0753  NA 135 133*  K  4.1 3.7  CL 106 101  CO2 23 24  GLUCOSE 90 142*  BUN 16 11  CREATININE 0.64 0.71  CALCIUM 8.3* 8.3*  MG 2.3  --    GFR: Estimated Creatinine Clearance: 201.8 mL/min (by C-G formula based on SCr of 0.71 mg/dL). Liver Function Tests: Recent Labs  Lab 10/21/22 2032  AST 16  ALT 21  ALKPHOS 77  BILITOT 0.7  PROT 6.4*  ALBUMIN 3.3*   No results for input(s): "LIPASE", "AMYLASE" in the last 168 hours. No results for input(s): "AMMONIA" in the last 168 hours. Coagulation Profile: No results for input(s): "INR", "PROTIME" in the last 168 hours. Cardiac Enzymes: No results for input(s): "CKTOTAL", "CKMB", "CKMBINDEX", "TROPONINI" in the last 168 hours. BNP (last 3 results) Recent Labs    10/03/22 1525  PROBNP 34.0   HbA1C: No results for input(s): "HGBA1C" in the last 72 hours. CBG: Recent Labs  Lab 10/21/22 1254 10/23/22 0017  GLUCAP 90 146*   Lipid Profile: No results for input(s): "CHOL", "HDL", "LDLCALC", "TRIG", "CHOLHDL", "LDLDIRECT" in the last 72 hours. Thyroid Function Tests: No results for input(s): "TSH", "T4TOTAL", "FREET4", "T3FREE", "THYROIDAB" in the last 72 hours. Anemia Panel: No results for input(s): "VITAMINB12", "FOLATE", "FERRITIN", "TIBC", "IRON", "RETICCTPCT" in the last 72 hours. Sepsis Labs: No results for input(s): "PROCALCITON", "LATICACIDVEN" in the last 168 hours.  No results found for this or any previous visit (from the past 240 hour(s)).       Radiology Studies: No results found.      Scheduled Meds:  atorvastatin  20 mg Oral Daily   cloBAZam  20 mg Oral BID   heparin  5,000 Units Subcutaneous Q8H   ketorolac  15 mg Intravenous Q6H   levETIRAcetam  1,500 mg Oral BID   oxcarbazepine  1,200 mg Oral BID   pyridoxine  200 mg Oral Daily   senna-docusate  1 tablet Oral BID   Continuous Infusions:   LOS: 4 days     [name], MD [facility]   If 7PM-7AM, please contact night-coverage  10/25/2022,  1:14 PM

## 2022-10-25 NOTE — TOC Progression Note (Signed)
Transition of Care (TOC) - Progression Note    Patient Details  Name: Randy Cordova. MRN: [id number] Date of Birth: [birth year]  Transition of Care [facility]) CM/SW Contact  Garret Reddish, RN Phone Number: 10/25/2022, 3:53 PM  Clinical Narrative:    Chart reviewed.  I have spoken with Mr. Brundrett today.  He informs me that he would like to go to Short term Rehab on discharge.  I have explained to him the process of SNF.  Mrs. Schlieper is agreeable to a bed search in the [place], [place], and [place] area.  He reports that many of his doctors are from [place].  Patient reports that he lives in [place] part time and also lives in [place].    I have completed Fl 2 and submitted for PASSR.  Patient has flagged for Level 2 [facility].  I have sent [place] MUST Fl 2, 30 day note, Provider Progress Note and H and P.  [facility] number is currently pending.    I have completed bed search to [place], [place], and [place] facilities.    TOC will continue to follow for discharge planning.     Expected Discharge Plan: Skilled Nursing Facility Barriers to Discharge: Continued Medical Work up  Expected Discharge Plan and Services                                               Social Determinants of Health (SDOH) Interventions SDOH Screenings   Food Insecurity: Patient Declined (10/25/2022)  Recent Concern: Food Insecurity - Food Insecurity Present (10/02/2022)  Housing: Patient Declined (10/25/2022)  Transportation Needs: Patient Declined (10/25/2022)  Recent Concern: Transportation Needs - Unmet Transportation Needs (10/02/2022)  Utilities: Patient Declined (10/25/2022)  Alcohol Screen: Low Risk  (10/02/2022)  Depression (PHQ2-9): High Risk (10/16/2021)  Financial Resource Strain: Medium Risk (10/02/2022)  Physical Activity: Insufficiently Active (10/02/2022)  Social Connections: Moderately Isolated (10/02/2022)  Stress: Stress Concern Present (10/02/2022)  Tobacco Use: Medium Risk  (10/21/2022)    Readmission Risk Interventions     No data to display

## 2022-10-25 NOTE — Progress Notes (Signed)
While assessing patient's pain, he shared concerns about his stay here. He stated "things have really gone downhill in the last five years here." Unprompted, patient shared he was not happy with his "28 hour emergency department stay," and the nurses "used to be my ray of sunshine but now there are too many travelers and the nurses aren't entertaining anymore." Reminded patient this is a hospital and nurses are well-educated individuals employed by the hospital to care for acutely ill patients. Patient again stated "yes, but you all used to be more entertaining. Now nobody wants to stand in here and talk to me." This RN stated the nursing staff is extremely busy and in order to give all patients equal attention there simply isn't enough time for [place] to be entertainers.  Pain medication administered per order. Ice chips provided per request. Will reassess pain per protocol.

## 2022-10-25 NOTE — NC FL2 (Signed)
[place] MEDICAID FL2 LEVEL OF CARE FORM     IDENTIFICATION  Patient Name: Randy Cordova. Birthdate: 1971-01-04 Sex: male Admission Date (Current Location): 10/21/2022  [facility] and [place] Number:  Chiropodist and Address:  [facility], [address], [place], [place] [address]      Provider Number: [id number]  Attending Physician Name and Address:  [name], MD  Relative Name and Phone Number:  Luberta Robertson [phone]    Current Level of Care: Hospital Recommended Level of Care: Skilled Nursing Facility Prior Approval Number:    Date Approved/Denied:   PASRR Number: Pending  Discharge Plan: SNF    Current Diagnoses: Patient Active Problem List   Diagnosis Date Noted   Inadequate pain control 10/21/2022   Foot pain, right 10/03/2022   Localized swelling of lower extremity 10/03/2022   COVID-19 01/22/2022   Preventative health care 12/21/2021   Prediabetes 12/21/2021   Rash 12/21/2021   Need for shingles vaccine 12/21/2021   Vitamin D deficiency 12/23/2020   Tobacco user 12/23/2020   Primary hypertension 12/23/2020   Osteoarthritis of both knees 12/23/2020   Nonalcoholic fatty liver disease 12/23/2020   Hyperlipidemia 12/23/2020   Hepatic cirrhosis (HCC) 12/23/2020   Generalized anxiety disorder 12/23/2020   Arthralgia of hip 12/23/2020   Epilepsy (HCC) 12/04/2020   Acute left hemiparesis (HCC) 12/04/2020   Morbid obesity with BMI of 60.0-69.9, adult (HCC) 12/04/2020   History of traumatic brain injury 06/30/2020   Grand mal seizure (HCC) 04/18/2020   Breakthrough seizures, recurrent (HCC) 03/19/2020   Thrombocytopenia (HCC)    OSA on CPAP    Hypothyroidism 10/29/2010   Decreased testosterone level 10/29/2010    Orientation RESPIRATION BLADDER Height & Weight     Self, Time, Situation, Place  Normal Continent Weight: (!) 210.2 kg Height:  6' (182.9 cm)  BEHAVIORAL SYMPTOMS/MOOD NEUROLOGICAL BOWEL  NUTRITION STATUS    Convulsions/Seizures Continent  (See Discharge Summary)  AMBULATORY STATUS COMMUNICATION OF NEEDS Skin   Extensive Assist Verbally Normal                       Personal Care Assistance Level of Assistance  Bathing, Feeding, Dressing Bathing Assistance: Maximum assistance Feeding assistance: Limited assistance Dressing Assistance: Limited assistance     Functional Limitations Info  Sight, Hearing, Speech Sight Info: Adequate Hearing Info: Adequate Speech Info: Adequate    SPECIAL CARE FACTORS FREQUENCY  PT (By licensed PT), OT (By licensed OT)     PT Frequency: 5x weekly OT Frequency: 5x weekly            Contractures Contractures Info: Not present    Additional Factors Info  Code Status, Allergies Code Status Info: Full Code Allergies Info: Aspartame And Phenylalanine           Current Medications (10/25/2022):  This is the current hospital active medication list Current Facility-Administered Medications  Medication Dose Route Frequency Provider Last Rate Last Admin   acetaminophen (TYLENOL) tablet 650 mg  650 mg Oral Q6H PRN Cox, Amy N, DO   650 mg at 10/22/22 2239   Or   acetaminophen (TYLENOL) suppository 650 mg  650 mg Rectal Q6H PRN Cox, Amy N, DO       atorvastatin (LIPITOR) tablet 20 mg  20 mg Oral Daily Cox, Amy N, DO   20 mg at 10/25/22 1105   bisacodyl (DULCOLAX) EC tablet 5 mg  5 mg Oral Daily PRN Cox, Amy N,  DO       cloBAZam (ONFI) tablet 20 mg  20 mg Oral BID Lolita Patella B, MD   20 mg at 10/25/22 1105   heparin injection 5,000 Units  5,000 Units Subcutaneous Q8H Cox, Amy N, DO   5,000 Units at 10/25/22 1428   hydrALAZINE (APRESOLINE) injection 5 mg  5 mg Intravenous Q6H PRN Cox, Amy N, DO       HYDROcodone-acetaminophen (NORCO/VICODIN) 5-325 MG per tablet 1-2 tablet  1-2 tablet Oral Q4H PRN Lolita Patella B, MD   2 tablet at 10/25/22 1428   ketoconazole (NIZORAL) 2 % cream   Topical Daily PRN Cox, Amy N, DO        ketorolac (TORADOL) 15 MG/ML injection 15 mg  15 mg Intravenous Q6H Sreenath, Sudheer B, MD   15 mg at 10/25/22 1105   levETIRAcetam (KEPPRA) tablet 1,500 mg  1,500 mg Oral BID Cox, Amy N, DO   1,500 mg at 10/25/22 1105   LORazepam (ATIVAN) injection 2 mg  2 mg Intravenous PRN Cox, Amy N, DO       morphine (PF) 2 MG/ML injection 2 mg  2 mg Intravenous Q4H PRN Lolita Patella B, MD   2 mg at 10/25/22 1106   nicotine (NICODERM CQ - dosed in mg/24 hours) patch 21 mg  21 mg Transdermal Daily PRN Cox, Amy N, DO       Or   nicotine polacrilex (NICORETTE) gum 2 mg  2 mg Oral PRN Cox, Amy N, DO       ondansetron (ZOFRAN) tablet 4 mg  4 mg Oral Q6H PRN Cox, Amy N, DO       Or   ondansetron (ZOFRAN) injection 4 mg  4 mg Intravenous Q6H PRN Cox, Amy N, DO       Oxcarbazepine (TRILEPTAL) tablet 1,200 mg  1,200 mg Oral BID Cox, Amy N, DO   1,200 mg at 10/25/22 1106   pyridOXINE (VITAMIN B6) tablet 200 mg  200 mg Oral Daily Cox, Amy N, DO   200 mg at 10/25/22 1428   senna-docusate (Senokot-S) tablet 1 tablet  1 tablet Oral BID [name], MD         Discharge Medications: Please see discharge summary for a list of discharge medications.  Relevant Imaging Results:  Relevant Lab Results:   Additional Information [id number]  Garret Reddish, RN

## 2022-10-25 NOTE — Progress Notes (Signed)
RE: Randy Cordova Date of Birth: December 09, 1970 Date: 10/25/2022     To Whom It May Concern:   Please be advised that the above-named patient will require a short-term nursing home stay - anticipated 30 days or less for rehabilitation and strengthening.  The plan is for return home

## 2022-10-26 DIAGNOSIS — R52 Pain, unspecified: Secondary | ICD-10-CM | POA: Diagnosis not present

## 2022-10-26 MED ORDER — BISACODYL 10 MG RE SUPP: 10.0000 mg | Freq: Every day | RECTAL | Status: DC | PRN

## 2022-10-26 MED ORDER — POLYETHYLENE GLYCOL 3350 17 G PO PACK
17.0000 g | PACK | Freq: Every day | ORAL | Status: DC
  Administered 2022-10-26 – 2022-10-27 (×2): 17 g via ORAL
  Filled 2022-10-26 (×7): qty 1

## 2022-10-26 NOTE — Progress Notes (Signed)
   10/26/22 1730  Gastrointestinal  Gastrointestinal (WDL) WDL  Last BM Date  10/26/22  Passing Flatus Yes  GI Symptoms Other (Comment) (Patient was able to have a bowel movement afer Miralax was given)

## 2022-10-26 NOTE — Progress Notes (Signed)
PT Cancellation Note  Patient Details Name: Randy Cordova. MRN: [id number] DOB: 1971-01-29   Cancelled Treatment:     Therapist in to see pt for PT this pm. Pt found in the bathroom, has been experiencing watery incontinent stool all day and unable to have a bowel movement after attempting for 3 hours. Pt given hot tea to sip on to facilitate situation. Will re-attempt next available time/date per POC.   Jannet Askew 10/26/2022, 1:09 PM

## 2022-10-26 NOTE — Progress Notes (Signed)
PROGRESS NOTE    Randy Cordova.  ZOX:[id number] DOB: 31-Jul-1970 DOA: 10/21/2022 PCP: Eden Emms, NP    Brief Narrative:   Randy Jeanette. is a 52 y.o. male with history of TBI's, history of seizures secondary to TBI's, status post vagal nerve stimulator placement, morbid obesity, OSA on CPAP, hypertension, hyperlipidemia, presented to the hospital because of severe right foot pain and seizures.  He said he fell down the stairs after twisting his ankle as he was walking up the stairs.  He said he had a seizure shortly after the fall and this was witnessed.  He said he had about 11 seizures yesterday.  It was also reported that he had a brief seizure-like activity in the emergency department.   [name] for breakthrough seizures presumed secondary to decreased seizure threshold due to right foot pain   Assessment & Plan:   Principal Problem:   Inadequate pain control Active Problems:   Breakthrough seizures, recurrent (HCC)   OSA on CPAP   Epilepsy (HCC)   Morbid obesity with BMI of 60.0-69.9, adult (HCC)   Tobacco user   Primary hypertension   Osteoarthritis of both knees   Nonalcoholic fatty liver disease   Hyperlipidemia   History of traumatic brain injury   Generalized anxiety disorder   Arthralgia of hip   Foot pain, right  Breakthrough seizures in a patient with refractory epilepsy who has a VNS in place: Consulted Dr. Wilford Corner, neurologist.  No changes to antiepileptics for now.  No diagnostic value and repeat EEG.  Continue current anti-epileptic regimen     Right foot pain, right plantar fasciitis on x-ray of the ankle/foot: Patient still having substantial pain and need for narcotic administration.  Podiatry consulted.  No plans for surgical intervention.  No evidence of tendon or ligament injury.  Patient agreeable to rehab placement.  Cam boot in place.  Follow-up in podiatry office in 4 weeks.  Patient essentially medically ready for discharge.     Morbid obesity BMI 62.85.  Complicates overall care and prognosis  Constipation Multimodal bowel regimen     Other comorbidities include osteoarthritis left lower extremities, hypertension, hyperlipidemia, tobacco use disorder, OSA on CPAP    DVT prophylaxis: SQ heparin Code Status: Full Family Communication: None Disposition Plan: Status is: Inpatient Remains inpatient appropriate because: Intractable pain.  Need for skilled nursing facility.   Level of care: Telemetry Medical  Consultants:  Podiatry-signed off Neurology-signed off  Procedures:  None  Antimicrobials: None   Subjective: No acute overnight events  Objective: Vitals:   10/25/22 1714 10/25/22 2005 10/26/22 0501 10/26/22 0834  BP: (!) 147/69 (!) 151/82 113/64 126/69  Pulse: 85 87 78 74  Resp: 16 20 20 19   Temp: 98.2 F (36.8 C) 97.6 F (36.4 C) (!) 97.5 F (36.4 C) (!) 97.5 F (36.4 C)  TempSrc:    Oral  SpO2: 95% 98% 98% 94%  Weight:      Height:        Intake/Output Summary (Last 24 hours) at 10/26/2022 1506 Last data filed at 10/26/2022 1042 Gross per 24 hour  Intake 240 ml  Output --  Net 240 ml   Filed Weights   10/21/22 1303  Weight: (!) 210.2 kg    Examination:  General exam: No acute distress Respiratory system: Lungs clear.  Normal work of breathing.  Room air Cardiovascular system: S1-S2, RRR, no murmurs, no pedal edema Gastrointestinal system: Obese, soft, NT/ND, normal bowel sounds Central nervous  system: Alert and oriented. No focal neurological deficits. Extremities: Right foot in surgical wraps.  Not removed.  Swelling decreased Skin: No rashes, lesions or ulcers Psychiatry: Judgement and insight appear normal. Mood & affect appropriate.     Data Reviewed: I have personally reviewed following labs and imaging studies  CBC: Recent Labs  Lab 10/22/22 0753  WBC 3.8*  HGB 12.9*  HCT 39.3  MCV 89.3  PLT 97*   Basic Metabolic Panel: Recent Labs  Lab  10/21/22 1327 10/22/22 0753  NA 135 133*  K 4.1 3.7  CL 106 101  CO2 23 24  GLUCOSE 90 142*  BUN 16 11  CREATININE 0.64 0.71  CALCIUM 8.3* 8.3*  MG 2.3  --    GFR: Estimated Creatinine Clearance: 201.8 mL/min (by C-G formula based on SCr of 0.71 mg/dL). Liver Function Tests: Recent Labs  Lab 10/21/22 2032  AST 16  ALT 21  ALKPHOS 77  BILITOT 0.7  PROT 6.4*  ALBUMIN 3.3*   No results for input(s): "LIPASE", "AMYLASE" in the last 168 hours. No results for input(s): "AMMONIA" in the last 168 hours. Coagulation Profile: No results for input(s): "INR", "PROTIME" in the last 168 hours. Cardiac Enzymes: No results for input(s): "CKTOTAL", "CKMB", "CKMBINDEX", "TROPONINI" in the last 168 hours. BNP (last 3 results) Recent Labs    10/03/22 1525  PROBNP 34.0   HbA1C: No results for input(s): "HGBA1C" in the last 72 hours. CBG: Recent Labs  Lab 10/21/22 1254 10/23/22 0017  GLUCAP 90 146*   Lipid Profile: No results for input(s): "CHOL", "HDL", "LDLCALC", "TRIG", "CHOLHDL", "LDLDIRECT" in the last 72 hours. Thyroid Function Tests: No results for input(s): "TSH", "T4TOTAL", "FREET4", "T3FREE", "THYROIDAB" in the last 72 hours. Anemia Panel: No results for input(s): "VITAMINB12", "FOLATE", "FERRITIN", "TIBC", "IRON", "RETICCTPCT" in the last 72 hours. Sepsis Labs: No results for input(s): "PROCALCITON", "LATICACIDVEN" in the last 168 hours.  No results found for this or any previous visit (from the past 240 hour(s)).       Radiology Studies: No results found.      Scheduled Meds:  atorvastatin  20 mg Oral Daily   cloBAZam  20 mg Oral BID   heparin  5,000 Units Subcutaneous Q8H   ketorolac  15 mg Intravenous Q6H   levETIRAcetam  1,500 mg Oral BID   oxcarbazepine  1,200 mg Oral BID   polyethylene glycol  17 g Oral Daily   pyridoxine  200 mg Oral Daily   senna-docusate  1 tablet Oral BID   Continuous Infusions:   LOS: 5 days     [name],  MD [facility]   If 7PM-7AM, please contact night-coverage  10/26/2022, 3:06 PM

## 2022-10-26 NOTE — Plan of Care (Signed)
Patient has complained of right foot pain today, was given pain medication that helps some. Patient greatest concern is constipation. Patient has been OOB to bathroom a couple times with not results. Recently was given bisacodyl will re-evaluate effectiveness. Will notify provider.  Problem: Education: Goal: Knowledge of General Education information will improve Description: Including pain rating scale, medication(s)/side effects and non-pharmacologic comfort measures Outcome: Progressing   Problem: Health Behavior/Discharge Planning: Goal: Ability to manage health-related needs will improve Outcome: Progressing   Problem: Clinical Measurements: Goal: Ability to maintain clinical measurements within normal limits will improve Outcome: Progressing Goal: Will remain free from infection Outcome: Progressing Goal: Diagnostic test results will improve Outcome: Progressing Goal: Respiratory complications will improve Outcome: Progressing Goal: Cardiovascular complication will be avoided Outcome: Progressing   Problem: Nutrition: Goal: Adequate nutrition will be maintained Outcome: Progressing   Problem: Activity: Goal: Risk for activity intolerance will decrease Outcome: Progressing   Problem: Coping: Goal: Level of anxiety will decrease Outcome: Progressing   Problem: Elimination: Goal: Will not experience complications related to bowel motility Outcome: Progressing Goal: Will not experience complications related to urinary retention Outcome: Progressing   Problem: Pain Managment: Goal: General experience of comfort will improve Outcome: Progressing   Problem: Safety: Goal: Ability to remain free from injury will improve Outcome: Progressing   Problem: Skin Integrity: Goal: Risk for impaired skin integrity will decrease Outcome: Progressing

## 2022-10-27 DIAGNOSIS — R52 Pain, unspecified: Secondary | ICD-10-CM | POA: Diagnosis not present

## 2022-10-27 LAB — BASIC METABOLIC PANEL
Anion gap: 7 (ref 5–15)
BUN: 26 mg/dL — ABNORMAL HIGH (ref 6–20)
CO2: 24 mmol/L (ref 22–32)
Calcium: 8.2 mg/dL — ABNORMAL LOW (ref 8.9–10.3)
Chloride: 104 mmol/L (ref 98–111)
Creatinine, Ser: 0.85 mg/dL (ref 0.61–1.24)
GFR, Estimated: >60 mL/min (ref >60)
Glucose, Bld: 95 mg/dL (ref 70–99)
Potassium: 4 mmol/L (ref 3.5–5.1)
Sodium: 135 mmol/L (ref 135–145)

## 2022-10-27 NOTE — Progress Notes (Signed)
PROGRESS NOTE    Randy Cordova.  WUJ:[id number] DOB: Sep 23, 1970 DOA: 10/21/2022 PCP: Eden Emms, NP    Brief Narrative:   Randy Cordova. is a 52 y.o. male with history of TBI's, history of seizures secondary to TBI's, status post vagal nerve stimulator placement, morbid obesity, OSA on CPAP, hypertension, hyperlipidemia, presented to the hospital because of severe right foot pain and seizures.  He said he fell down the stairs after twisting his ankle as he was walking up the stairs.  He said he had a seizure shortly after the fall and this was witnessed.  He said he had about 11 seizures yesterday.  It was also reported that he had a brief seizure-like activity in the emergency department.   [name] for breakthrough seizures presumed secondary to decreased seizure threshold due to right foot pain   Assessment & Plan:   Principal Problem:   Inadequate pain control Active Problems:   Breakthrough seizures, recurrent (HCC)   OSA on CPAP   Epilepsy (HCC)   Morbid obesity with BMI of 60.0-69.9, adult (HCC)   Tobacco user   Primary hypertension   Osteoarthritis of both knees   Nonalcoholic fatty liver disease   Hyperlipidemia   History of traumatic brain injury   Generalized anxiety disorder   Arthralgia of hip   Foot pain, right  Breakthrough seizures in a patient with refractory epilepsy who has a VNS in place: Consulted Dr. Wilford Corner, neurologist.  No changes to antiepileptics for now.  No diagnostic value and repeat EEG.  Continue current anti-epileptic regimen     Right foot pain, right plantar fasciitis on x-ray of the ankle/foot: Patient still having substantial pain and need for narcotic administration.  Podiatry consulted.  No plans for surgical intervention.  No evidence of tendon or ligament injury.  Patient agreeable to rehab placement.  Cam boot in place.  Follow-up in podiatry office in 4 weeks.  Patient medically ready for  discharge  Constipation Patient had BM on 9/22 Continue bowel regimen  Morbid obesity BMI 62.85.  Complicates overall care and prognosis    Other comorbidities include osteoarthritis left lower extremities, hypertension, hyperlipidemia, tobacco use disorder, OSA on CPAP    DVT prophylaxis: SQ heparin Code Status: Full Family Communication: None Disposition Plan: Status is: Inpatient Remains inpatient appropriate because: Intractable pain.  Need for skilled nursing facility.  Medically stable for discharge   Level of care: Telemetry Medical  Consultants:  Podiatry-signed off Neurology-signed off  Procedures:  None  Antimicrobials: None   Subjective: No acute events overnight  Objective: Vitals:   10/26/22 1627 10/26/22 2141 10/27/22 0504 10/27/22 0845  BP: 127/65 (!) 111/45 (!) 101/52 110/63  Pulse: 90 89 83 78  Resp: 14 (!) 22 20 18   Temp: 97.7 F (36.5 C) 98 F (36.7 C) 98.2 F (36.8 C) 97.9 F (36.6 C)  TempSrc:  Oral Oral   SpO2: 97% 96% 95% 93%  Weight:      Height:        Intake/Output Summary (Last 24 hours) at 10/27/2022 1133 Last data filed at 10/27/2022 1059 Gross per 24 hour  Intake 1200 ml  Output 1 ml  Net 1199 ml   Filed Weights   10/21/22 1303  Weight: (!) 210.2 kg    Examination:  General exam: NAD Respiratory system: Lungs diminished at bases.  Normal work of breathing.  Room air Cardiovascular system: S1-S2, RRR, no murmurs, no pedal edema Gastrointestinal system: Obese, soft,  NT/ND, normal bowel sounds Central nervous system: Alert and oriented. No focal neurological deficits. Extremities: Right foot in surgical wraps.  Not removed.  Swelling decreased Skin: No rashes, lesions or ulcers Psychiatry: Judgement and insight appear normal. Mood & affect appropriate.     Data Reviewed: I have personally reviewed following labs and imaging studies  CBC: Recent Labs  Lab 10/22/22 0753  WBC 3.8*  HGB 12.9*  HCT 39.3  MCV  89.3  PLT 97*   Basic Metabolic Panel: Recent Labs  Lab 10/21/22 1327 10/22/22 0753 10/27/22 0821  NA 135 133* 135  K 4.1 3.7 4.0  CL 106 101 104  CO2 23 24 24   GLUCOSE 90 142* 95  BUN 16 11 26*  CREATININE 0.64 0.71 0.85  CALCIUM 8.3* 8.3* 8.2*  MG 2.3  --   --    GFR: Estimated Creatinine Clearance: 189.9 mL/min (by C-G formula based on SCr of 0.85 mg/dL). Liver Function Tests: Recent Labs  Lab 10/21/22 2032  AST 16  ALT 21  ALKPHOS 77  BILITOT 0.7  PROT 6.4*  ALBUMIN 3.3*   No results for input(s): "LIPASE", "AMYLASE" in the last 168 hours. No results for input(s): "AMMONIA" in the last 168 hours. Coagulation Profile: No results for input(s): "INR", "PROTIME" in the last 168 hours. Cardiac Enzymes: No results for input(s): "CKTOTAL", "CKMB", "CKMBINDEX", "TROPONINI" in the last 168 hours. BNP (last 3 results) Recent Labs    10/03/22 1525  PROBNP 34.0   HbA1C: No results for input(s): "HGBA1C" in the last 72 hours. CBG: Recent Labs  Lab 10/21/22 1254 10/23/22 0017  GLUCAP 90 146*   Lipid Profile: No results for input(s): "CHOL", "HDL", "LDLCALC", "TRIG", "CHOLHDL", "LDLDIRECT" in the last 72 hours. Thyroid Function Tests: No results for input(s): "TSH", "T4TOTAL", "FREET4", "T3FREE", "THYROIDAB" in the last 72 hours. Anemia Panel: No results for input(s): "VITAMINB12", "FOLATE", "FERRITIN", "TIBC", "IRON", "RETICCTPCT" in the last 72 hours. Sepsis Labs: No results for input(s): "PROCALCITON", "LATICACIDVEN" in the last 168 hours.  No results found for this or any previous visit (from the past 240 hour(s)).       Radiology Studies: No results found.      Scheduled Meds:  atorvastatin  20 mg Oral Daily   cloBAZam  20 mg Oral BID   heparin  5,000 Units Subcutaneous Q8H   ketorolac  15 mg Intravenous Q6H   levETIRAcetam  1,500 mg Oral BID   oxcarbazepine  1,200 mg Oral BID   polyethylene glycol  17 g Oral Daily   pyridoxine  200 mg Oral  Daily   senna-docusate  1 tablet Oral BID   Continuous Infusions:   LOS: 6 days     [name], MD [facility]   If 7PM-7AM, please contact night-coverage  10/27/2022, 11:33 AM

## 2022-10-27 NOTE — TOC Progression Note (Signed)
Transition of Care (TOC) - Progression Note    Patient Details  Name: Randy Cordova. MRN: [id number] Date of Birth: January 25, 1971  Transition of Care [facility]) CM/SW Contact  Susa Simmonds, [place] Phone Number: 10/27/2022, 11:51 AM  Clinical Narrative:   CSW received a call back from [place] with [facility] who told CSW she will review patient and let TOC know about a decision tomorrow.     Expected Discharge Plan: Skilled Nursing Facility Barriers to Discharge: Continued Medical Work up  Expected Discharge Plan and Services                                               Social Determinants of Health (SDOH) Interventions SDOH Screenings   Food Insecurity: Patient Declined (10/25/2022)  Recent Concern: Food Insecurity - Food Insecurity Present (10/02/2022)  Housing: Patient Declined (10/25/2022)  Transportation Needs: Patient Declined (10/25/2022)  Recent Concern: Transportation Needs - Unmet Transportation Needs (10/02/2022)  Utilities: Patient Declined (10/25/2022)  Alcohol Screen: Low Risk  (10/02/2022)  Depression (PHQ2-9): High Risk (10/16/2021)  Financial Resource Strain: Medium Risk (10/02/2022)  Physical Activity: Insufficiently Active (10/02/2022)  Social Connections: Moderately Isolated (10/02/2022)  Stress: Stress Concern Present (10/02/2022)  Tobacco Use: Medium Risk (10/21/2022)    Readmission Risk Interventions     No data to display

## 2022-10-27 NOTE — Plan of Care (Signed)

## 2022-10-27 NOTE — TOC Progression Note (Signed)
Transition of Care (TOC) - Progression Note    Patient Details  Name: Randy Cordova. MRN: [id number] Date of Birth: 11-08-1970  Transition of Care [facility]) CM/SW Contact  Susa Simmonds, [place] Phone Number: 10/27/2022, 10:46 AM  Clinical Narrative:   Bed offer to [facility] presented to patient. Patient asked if it would be a private room. CSW reached out to [facility] in admissions to confirm bed offer and see if a private room was available. CSW received no answer and sent a text message. No response at this time.     Expected Discharge Plan: Skilled Nursing Facility Barriers to Discharge: Continued Medical Work up  Expected Discharge Plan and Services                                               Social Determinants of Health (SDOH) Interventions SDOH Screenings   Food Insecurity: Patient Declined (10/25/2022)  Recent Concern: Food Insecurity - Food Insecurity Present (10/02/2022)  Housing: Patient Declined (10/25/2022)  Transportation Needs: Patient Declined (10/25/2022)  Recent Concern: Transportation Needs - Unmet Transportation Needs (10/02/2022)  Utilities: Patient Declined (10/25/2022)  Alcohol Screen: Low Risk  (10/02/2022)  Depression (PHQ2-9): High Risk (10/16/2021)  Financial Resource Strain: Medium Risk (10/02/2022)  Physical Activity: Insufficiently Active (10/02/2022)  Social Connections: Moderately Isolated (10/02/2022)  Stress: Stress Concern Present (10/02/2022)  Tobacco Use: Medium Risk (10/21/2022)    Readmission Risk Interventions     No data to display

## 2022-10-28 DIAGNOSIS — R52 Pain, unspecified: Secondary | ICD-10-CM | POA: Diagnosis not present

## 2022-10-28 NOTE — TOC Progression Note (Addendum)
Transition of Care (TOC) - Progression Note    Patient Details  Name: Randy Cordova. MRN: [id number] Date of Birth: [birth year]  Transition of Care [facility]) CM/SW Contact  Allena Katz, LCSW Phone Number: 10/28/2022, 2:46 PM  Clinical Narrative:   CSW shared bed offers. [facility] to check and see if they can accommodate and if they can accept with a private room. Once PT/OT notes go in CSW will start auth pending hearing from [place].   2:52pm [facility] reports they didn't realize the patients weight was 463 lb and now they are unable to take him.    Expected Discharge Plan: Skilled Nursing Facility Barriers to Discharge: Continued Medical Work up  Expected Discharge Plan and Services                                               Social Determinants of Health (SDOH) Interventions SDOH Screenings   Food Insecurity: Patient Declined (10/25/2022)  Recent Concern: Food Insecurity - Food Insecurity Present (10/02/2022)  Housing: Patient Declined (10/25/2022)  Transportation Needs: Patient Declined (10/25/2022)  Recent Concern: Transportation Needs - Unmet Transportation Needs (10/02/2022)  Utilities: Patient Declined (10/25/2022)  Alcohol Screen: Low Risk  (10/02/2022)  Depression (PHQ2-9): High Risk (10/16/2021)  Financial Resource Strain: Medium Risk (10/02/2022)  Physical Activity: Insufficiently Active (10/02/2022)  Social Connections: Moderately Isolated (10/02/2022)  Stress: Stress Concern Present (10/02/2022)  Tobacco Use: Medium Risk (10/21/2022)    Readmission Risk Interventions     No data to display

## 2022-10-28 NOTE — Progress Notes (Signed)
Pt pre-medicated with Vicodin for physical therapy.

## 2022-10-28 NOTE — Progress Notes (Signed)
Occupational Therapy Treatment Patient Details Name: Randy Cordova. MRN: [id number] DOB: [birth year] Today's Date: 10/28/2022   History of present illness 52 y/o male presented to ED on 10/21/22 for ankle pain and seizure activity. Multiple seizures in ED. X-ray not suggestive of R ankle fx. 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 He reports 9/10 pain at worst in R foot/ankle during session today and was pre-medicated prior to PT session earlier. CAM boot already donned. He had a bath and got dressed with nursing staff this morning. Pt educated on use of reacher for LB ADL performance to conserve energy/due to struggles reaching due to body habitus. He verbalized understanding. increased time with HOB elevated to perform and CGA for mobility within the room using RW/grabbing to furniture in the room. Pt performed transfer to recliner and was positioned comfortably. Pt performed dynamic standing balance activity reaching in BOS with SUP and OOBOS with CGA with standing tolerance of ~3 minutes during today's session. He declined need for chair alarm and was educated on safety strategies/need to call nurse to assist with transfer back to bed when ready. He verbalized understanding. Pt is noted to be impulsive with all movements limiting his safety. Nurse notified of pt status in chair with no chair alarm. He will cont to require acute skilled OT services to progress his safety/IND with ADL/IADL performance to return to PLOF.       If plan is discharge home, recommend the following:  A little help with bathing/dressing/bathroom;Assistance with cooking/housework;Assist for transportation;Help with stairs or ramp for entrance;A little help with walking and/or transfers   Equipment Recommendations  Other (comment)    Recommendations for Other Services      Precautions / Restrictions  Precautions Precautions: Fall;Other (comment) Precaution Comments: Seizure (has VNS device); R ankle pain (in CAM boot) Required Braces or Orthoses: Other Brace Splint/Cast: RLE Other Brace: Per Dr. Logan Bores 9/19, now RLE WBAT in CAM boot Restrictions Weight Bearing Restrictions: Yes RLE Weight Bearing: Weight bearing as tolerated Other Position/Activity Restrictions: Per Dr. Logan Bores 9/19, now RLE WBAT in CAM boot       Mobility Bed Mobility Overal bed mobility: Needs Assistance Bed Mobility: Supine to Sit, Sit to Supine     Supine to sit: Modified independent (Device/Increase time), HOB elevated     General bed mobility comments: increased time with HOB elevated to perform and CGA for mobility within the room using RW/grabbing to furniture in the room. Pt performed transfer to recliner and was positioned comofortably. He declined need for chair alarm and was educated on safety strategies/need to call nurse to assist with transfer back to bed when ready. He verbalized understanding.    Transfers Overall transfer level: Needs assistance Equipment used: Rolling walker (2 wheels) (bariatric) Transfers: Sit to/from Stand Sit to Stand: Contact guard assist           General transfer comment: CGA for STS from EOB     Balance Overall balance assessment: Needs assistance Sitting-balance support: Feet supported, No upper extremity supported Sitting balance-Leahy Scale: Good Sitting balance - Comments: good balance while reaching for items within BOS, CGA/SBA for items more OOBOS   Standing balance support: Bilateral upper extremity supported, Reliant on assistive device for balance Standing balance-Leahy Scale: Good Standing balance comment: good balance while reaching for items within BOS, CGA/SBA for items more OOBOS while in  standing at bedside                           ADL either performed or assessed with clinical judgement   ADL                                               Extremity/Trunk Assessment Upper Extremity Assessment Upper Extremity Assessment: Overall WFL for tasks assessed            Vision       Perception     Praxis      Cognition Arousal: Alert Behavior During Therapy: WFL for tasks assessed/performed Overall Cognitive Status: Within Functional Limits for tasks assessed                                          Exercises Other Exercises Other Exercises: Pt educated on use of reacher for LB ADL performance to conserve energy/due to struggles reaching due to body habitus. He verbalized understanding.    Shoulder Instructions       General Comments RLE CAM boot donned    Pertinent Vitals/ Pain       Pain Assessment Pain Assessment: 0-10 Pain Score: 9  Pain Location: R ankle/foot Pain Descriptors / Indicators: Constant, Aching, Discomfort, Sharp Pain Intervention(s): Limited activity within patient's tolerance, Premedicated before session, Repositioned, Monitored during session  Home Living                                          Prior Functioning/Environment              Frequency  Min 1X/week        Progress Toward Goals  OT Goals(current goals can now be found in the care plan section)  Progress towards OT goals: Progressing toward goals  Acute Rehab OT Goals Patient Stated Goal: return to [place] with his wife OT Goal Formulation: With patient Time For Goal Achievement: 11/05/22 Potential to Achieve Goals: Good  Plan      Co-evaluation          OT goals addressed during session: Proper use of Adaptive equipment and DME;ADL's and self-care      AM-PAC OT "6 Clicks" Daily Activity     Outcome Measure   Help from another person eating meals?: None Help from another person taking care of personal grooming?: None Help from another person toileting, which includes using toliet, bedpan, or urinal?: A Little Help from another  person bathing (including washing, rinsing, drying)?: A Little Help from another person to put on and taking off regular upper body clothing?: A Little Help from another person to put on and taking off regular lower body clothing?: A Little 6 Click Score: 20    End of Session Equipment Utilized During Treatment: Rolling walker (2 wheels)  OT Visit Diagnosis: Pain;Unsteadiness on feet (R26.81);Other abnormalities of gait and mobility (R26.89);History of falling (Z91.81) Pain - Right/Left: Right Pain - part of body: Ankle and joints of foot   Activity Tolerance Patient limited by pain   Patient Left in chair;with call bell/phone within reach   Nurse Communication  (  left in chair with no alarm d/t refusal and stated he would call for help when ready to go back to bed)        Time: 1345-1427 OT Time Calculation (min): 42 min  Charges: OT General Charges $OT Visit: 1 Visit OT Treatments $Therapeutic Activity: 38-52 mins  Jabree Rebert, OTR/L 10/28/2022, 2:42 PM

## 2022-10-28 NOTE — Progress Notes (Signed)
Physical Therapy Treatment Patient Details Name: Randy Cordova. MRN: [id number] DOB: 04-10-1970 Today's Date: 10/28/2022   History of Present Illness 52 y/o male presented to ED on 10/21/22 for ankle pain and seizure activity. Multiple seizures in ED. X-ray not suggestive of R ankle fx. 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  During session pt modified independent with bed mobility; CGA with transfer from significantly elevated bed height; and CGA to ambulate 40 feet with bariatric RW use (limited distance d/t R ankle/foot pain).  Pt reporting 10/10 "good" R ankle/foot pain end of session (nurse notified).  Will continue to focus on progressive functional mobility during hospitalization.    If plan is discharge home, recommend the following: A little help with bathing/dressing/bathroom;Assistance with cooking/housework;Help with stairs or ramp for entrance;Assist for transportation;A little help with walking and/or transfers   Can travel by private vehicle        Equipment Recommendations  Rolling walker (2 wheels);BSC/3in1 (bariatric)    Recommendations for Other Services       Precautions / Restrictions Precautions Precautions: Fall;Other (comment) Precaution Comments: Seizure (has VNS device); R ankle pain (in CAM boot) Required Braces or Orthoses: Other Brace Splint/Cast: RLE Other Brace: Per Dr. Logan Bores 9/19, now RLE WBAT in CAM boot Restrictions Weight Bearing Restrictions: Yes RLE Weight Bearing: Weight bearing as tolerated Other Position/Activity Restrictions: Per Dr. Logan Bores 9/19, now RLE WBAT in CAM boot     Mobility  Bed Mobility Overal bed mobility: Needs Assistance Bed Mobility: Supine to Sit, Sit to Supine     Supine to sit: Modified independent (Device/Increase time), HOB elevated Sit to supine:  Modified independent (Device/Increase time), HOB elevated   General bed mobility comments: increased effort to perform on own    Transfers Overall transfer level: Needs assistance Equipment used: Rolling walker (2 wheels) (bariatric) Transfers: Sit to/from Stand Sit to Stand: Contact guard assist, From elevated surface           General transfer comment: significantly elevated bed height; increased effort to stand    Ambulation/Gait Ambulation/Gait assistance: Contact guard assist Gait Distance (Feet): 40 Feet Assistive device: Rolling walker (2 wheels) (bariatric) Gait Pattern/deviations: Step-to pattern Gait velocity: decreased     General Gait Details: antalgic; decreased stance time R LE   Stairs             Wheelchair Mobility     Tilt Bed    Modified Rankin (Stroke Patients Only)       Balance Overall balance assessment: Needs assistance Sitting-balance support: No upper extremity supported, Feet supported Sitting balance-Leahy Scale: Good Sitting balance - Comments: steady reaching within BOS   Standing balance support: Bilateral upper extremity supported, Reliant on assistive device for balance Standing balance-Leahy Scale: Good Standing balance comment: steady ambulating with RW use                            Cognition Arousal: Alert Behavior During Therapy: WFL for tasks assessed/performed Overall Cognitive Status: Within Functional Limits for tasks assessed                                          Exercises General Exercises -  Lower Extremity Long Arc Quad: AROM, Strengthening, Both, Seated (10 reps x3 B LE's)    General Comments General comments (skin integrity, edema, etc.): R LE CAM boot donned.  Nursing cleared pt for participation in physical therapy.  Pt agreeable to PT session.      Pertinent Vitals/Pain Pain Assessment Pain Assessment: 0-10 Pain Score: 10-Worst pain ever Pain Location: R  ankle/foot Pain Descriptors / Indicators: Constant, Aching, Discomfort, Sharp Pain Intervention(s): Limited activity within patient's tolerance, Monitored during session, Premedicated before session, Repositioned, Other (comment) (RN notified of pt's pain) Vitals (HR and SpO2 on room air) stable and WFL throughout treatment session.    Home Living                          Prior Function            PT Goals (current goals can now be found in the care plan section) Acute Rehab PT Goals Patient Stated Goal: to improve mobility PT Goal Formulation: With patient Time For Goal Achievement: 11/05/22 Potential to Achieve Goals: Good Progress towards PT goals: Progressing toward goals    Frequency    Min 1X/week      PT Plan      Co-evaluation              AM-PAC PT "6 Clicks" Mobility   Outcome Measure  Help needed turning from your back to your side while in a flat bed without using bedrails?: None Help needed moving from lying on your back to sitting on the side of a flat bed without using bedrails?: None Help needed moving to and from a bed to a chair (including a wheelchair)?: A Little Help needed standing up from a chair using your arms (e.g., wheelchair or bedside chair)?: A Lot Help needed to walk in hospital room?: A Little Help needed climbing 3-5 steps with a railing? : A Lot 6 Click Score: 18    End of Session Equipment Utilized During Treatment: Gait belt Activity Tolerance: Patient limited by pain Patient left: in bed;with call bell/phone within reach (pt declining bed alarm) Nurse Communication: Other (comment) (pt's pain status) PT Visit Diagnosis: Unsteadiness on feet (R26.81);Muscle weakness (generalized) (M62.81);Other abnormalities of gait and mobility (R26.89)     Time: 8657-8469 PT Time Calculation (min) (ACUTE ONLY): 35 min  Charges:    $Gait Training: 8-22 mins $Therapeutic Activity: 8-22 mins PT General Charges $$ ACUTE PT  VISIT: 1 Visit                     Hendricks Limes, PT 10/28/22, 12:50 PM

## 2022-10-28 NOTE — Plan of Care (Signed)

## 2022-10-28 NOTE — Progress Notes (Signed)
PROGRESS NOTE    Randy Cordova.  EXB:[id number] DOB: 1970-03-02 DOA: 10/21/2022 PCP: Eden Emms, NP    Brief Narrative:   Randy Cordova. is a 52 y.o. male with history of TBI's, history of seizures secondary to TBI's, status post vagal nerve stimulator placement, morbid obesity, OSA on CPAP, hypertension, hyperlipidemia, presented to the hospital because of severe right foot pain and seizures.  He said he fell down the stairs after twisting his ankle as he was walking up the stairs.  He said he had a seizure shortly after the fall and this was witnessed.  He said he had about 11 seizures yesterday.  It was also reported that he had a brief seizure-like activity in the emergency department.   [name] for breakthrough seizures presumed secondary to decreased seizure threshold due to right foot pain   Assessment & Plan:   Principal Problem:   Inadequate pain control Active Problems:   Breakthrough seizures, recurrent (HCC)   OSA on CPAP   Epilepsy (HCC)   Morbid obesity with BMI of 60.0-69.9, adult (HCC)   Tobacco user   Primary hypertension   Osteoarthritis of both knees   Nonalcoholic fatty liver disease   Hyperlipidemia   History of traumatic brain injury   Generalized anxiety disorder   Arthralgia of hip   Foot pain, right  Breakthrough seizures in a patient with refractory epilepsy who has a VNS in place: Consulted Dr. Wilford Corner, neurologist.  No changes to antiepileptics for now.  No diagnostic value in repeat EEG.  Continue current anti-epileptic regimen     Right foot pain, right plantar fasciitis on x-ray of the ankle/foot: Patient still having substantial pain and need for narcotic administration.  Podiatry consulted.  No plans for surgical intervention.  No evidence of tendon or ligament injury.  Patient agreeable to rehab placement.  Cam boot in place.  Follow-up in podiatry office in 4 weeks.  Patient medically ready for discharge.  Pain  reasonably well controlled, patient having Bms.  Would like a private room, [facility] looking [facility]   Constipation Patient had BM on 9/22, 9/23 Continue bowel regimen  Morbid obesity BMI 62.85.  Complicates overall care and prognosis    Other comorbidities include osteoarthritis left lower extremities, hypertension, hyperlipidemia, tobacco use disorder, OSA on CPAP    DVT prophylaxis: SQ heparin Code Status: Full Family Communication: None Disposition Plan: Status is: Inpatient Remains inpatient appropriate because: Intractable pain.  Need for skilled nursing facility.  Medically stable for discharge   Level of care: Telemetry Medical  Consultants:  Podiatry-signed off Neurology-signed off  Procedures:  None  Antimicrobials: None   Subjective: No acute events overnight  Objective: Vitals:   10/27/22 1750 10/27/22 2000 10/28/22 0407 10/28/22 0820  BP: 127/72 123/64 117/62 (!) 98/48  Pulse: 78 82 72 64  Resp: 16 18    Temp: 98 F (36.7 C) 97.9 F (36.6 C) 97.9 F (36.6 C) (!) 97.4 F (36.3 C)  TempSrc:  Oral Oral Oral  SpO2: 93% 98% 97% 97%  Weight:      Height:        Intake/Output Summary (Last 24 hours) at 10/28/2022 1304 Last data filed at 10/27/2022 1500 Gross per 24 hour  Intake 240 ml  Output --  Net 240 ml   Filed Weights   10/21/22 1303  Weight: (!) 210.2 kg    Examination:  General exam: No acute distress Respiratory system: Lungs diminished at bases.  Normal work of breathing.  Room air Cardiovascular system: S1-S2, RRR, no murmurs, no pedal edema Gastrointestinal system: Obese, soft, NT/ND, normal bowel sounds Central nervous system: Alert and oriented. No focal neurological deficits. Extremities: Right foot in surgical wraps.  Not removed.  Swelling decreased Skin: No rashes, lesions or ulcers Psychiatry: Judgement and insight appear normal. Mood & affect appropriate.     Data Reviewed: I have personally reviewed following labs and  imaging studies  CBC: Recent Labs  Lab 10/22/22 0753  WBC 3.8*  HGB 12.9*  HCT 39.3  MCV 89.3  PLT 97*   Basic Metabolic Panel: Recent Labs  Lab 10/21/22 1327 10/22/22 0753 10/27/22 0821  NA 135 133* 135  K 4.1 3.7 4.0  CL 106 101 104  CO2 23 24 24   GLUCOSE 90 142* 95  BUN 16 11 26*  CREATININE 0.64 0.71 0.85  CALCIUM 8.3* 8.3* 8.2*  MG 2.3  --   --    GFR: Estimated Creatinine Clearance: 189.9 mL/min (by C-G formula based on SCr of 0.85 mg/dL). Liver Function Tests: Recent Labs  Lab 10/21/22 2032  AST 16  ALT 21  ALKPHOS 77  BILITOT 0.7  PROT 6.4*  ALBUMIN 3.3*   No results for input(s): "LIPASE", "AMYLASE" in the last 168 hours. No results for input(s): "AMMONIA" in the last 168 hours. Coagulation Profile: No results for input(s): "INR", "PROTIME" in the last 168 hours. Cardiac Enzymes: No results for input(s): "CKTOTAL", "CKMB", "CKMBINDEX", "TROPONINI" in the last 168 hours. BNP (last 3 results) Recent Labs    10/03/22 1525  PROBNP 34.0   HbA1C: No results for input(s): "HGBA1C" in the last 72 hours. CBG: Recent Labs  Lab 10/23/22 0017  GLUCAP 146*   Lipid Profile: No results for input(s): "CHOL", "HDL", "LDLCALC", "TRIG", "CHOLHDL", "LDLDIRECT" in the last 72 hours. Thyroid Function Tests: No results for input(s): "TSH", "T4TOTAL", "FREET4", "T3FREE", "THYROIDAB" in the last 72 hours. Anemia Panel: No results for input(s): "VITAMINB12", "FOLATE", "FERRITIN", "TIBC", "IRON", "RETICCTPCT" in the last 72 hours. Sepsis Labs: No results for input(s): "PROCALCITON", "LATICACIDVEN" in the last 168 hours.  No results found for this or any previous visit (from the past 240 hour(s)).       Radiology Studies: No results found.      Scheduled Meds:  atorvastatin  20 mg Oral Daily   cloBAZam  20 mg Oral BID   heparin  5,000 Units Subcutaneous Q8H   ketorolac  15 mg Intravenous Q6H   levETIRAcetam  1,500 mg Oral BID   oxcarbazepine   1,200 mg Oral BID   polyethylene glycol  17 g Oral Daily   pyridoxine  200 mg Oral Daily   senna-docusate  1 tablet Oral BID   Continuous Infusions:   LOS: 7 days     [name], MD [facility]   If 7PM-7AM, please contact night-coverage  10/28/2022, 1:04 PM

## 2022-10-29 DIAGNOSIS — R52 Pain, unspecified: Secondary | ICD-10-CM | POA: Diagnosis not present

## 2022-10-29 MED ORDER — KETOROLAC TROMETHAMINE 15 MG/ML IJ SOLN
15.0000 mg | Freq: Four times a day (QID) | INTRAMUSCULAR | Status: DC
  Administered 2022-10-29 – 2022-10-30 (×5): 15 mg via INTRAVENOUS
  Filled 2022-10-29 (×4): qty 1

## 2022-10-29 NOTE — Progress Notes (Addendum)
Patient exhibits increased slurred speech with delayed response at 1640.  Patient unable to open left eye and increased neck and jaw spasms to left side with voluntary movement attempts.  Dr Georgeann Oppenheim, MD notified with no knew orders.

## 2022-10-29 NOTE — Progress Notes (Signed)
PROGRESS NOTE    Randy Cordova.  YQM:[id number] DOB: Nov 10, 1970 DOA: 10/21/2022 PCP: Eden Emms, NP    Brief Narrative:   Randy Vivier. is a 52 y.o. male with history of TBI's, history of seizures secondary to TBI's, status post vagal nerve stimulator placement, morbid obesity, OSA on CPAP, hypertension, hyperlipidemia, presented to the hospital because of severe right foot pain and seizures.  He said he fell down the stairs after twisting his ankle as he was walking up the stairs.  He said he had a seizure shortly after the fall and this was witnessed.  He said he had about 11 seizures yesterday.  It was also reported that he had a brief seizure-like activity in the emergency department.   Patient admitted to the hospital for breakthrough seizures presumed secondary to decreased seizure threshold due to right foot pain  9/24: Patient medically ready for discharge at this time.  Pain reasonably well-controlled and patient having bowel movements.  Pending insurance authorization and placement in skilled nursing facility.   Assessment & Plan:   Principal Problem:   Inadequate pain control Active Problems:   Breakthrough seizures, recurrent (HCC)   OSA on CPAP   Epilepsy (HCC)   Morbid obesity with BMI of 60.0-69.9, adult (HCC)   Tobacco user   Primary hypertension   Osteoarthritis of both knees   Nonalcoholic fatty liver disease   Hyperlipidemia   History of traumatic brain injury   Generalized anxiety disorder   Arthralgia of hip   Foot pain, right  Breakthrough seizures in a patient with refractory epilepsy who has a VNS in place: Consulted Dr. Wilford Corner, neurologist.  No changes to antiepileptics for now.  No diagnostic value in repeat EEG.  Continue current anti-epileptic regimen     Right foot pain, right plantar fasciitis on x-ray of the ankle/foot: Patient's pain is better controlled.  Podiatry consulted.  Reviewed imaging findings with patient.  No evidence of  tendon or ligamentous injury.  No bony fractures.  Suspect all soft tissue injury.  Multimodal pain regimen.  Minimize narcotic use.  Cam boot in place.  PT OT as tolerated.  Stable for discharge to skilled nursing facility.  Insurance authorization is pending.   Constipation Patient had BM on 9/22, 9/23 Continue bowel regimen  Morbid obesity BMI 62.85.  Complicates overall care and prognosis    Other comorbidities include osteoarthritis left lower extremities, hypertension, hyperlipidemia, tobacco use disorder, OSA on CPAP    DVT prophylaxis: SQ heparin Code Status: Full Family Communication: None Disposition Plan: Status is: Inpatient Remains inpatient appropriate because: Intractable pain.  Need for skilled nursing facility.  Medically stable for discharge   Level of care: Telemetry Medical  Consultants:  Podiatry-signed off Neurology-signed off  Procedures:  None  Antimicrobials: None   Subjective: No acute events overnight.  Pain reasonably well-controlled  Objective: Vitals:   10/28/22 1608 10/28/22 2151 10/29/22 0344 10/29/22 0811  BP: 117/61 123/71 128/80 122/68  Pulse: 70 66 72 70  Resp: 18 16 18 20   Temp: 98.7 F (37.1 C) 97.9 F (36.6 C)  97.7 F (36.5 C)  TempSrc:  Oral    SpO2: 97% 99% 96% 97%  Weight:      Height:        Intake/Output Summary (Last 24 hours) at 10/29/2022 1233 Last data filed at 10/28/2022 1907 Gross per 24 hour  Intake 240 ml  Output --  Net 240 ml   Filed Weights   10/21/22 1303  Weight: (!) 210.2 kg    Examination:  General exam: NAD Respiratory system: Lungs.  Normal work of breathing.  Room air Cardiovascular system: S1-S2, RRR, no murmurs, no pedal edema Gastrointestinal system: Obese, soft, NT/ND, normal bowel sounds Central nervous system: Alert and oriented. No focal neurological deficits. Extremities: Right foot in surgical wraps.  Not removed.  Swelling decreased Skin: No rashes, lesions or  ulcers Psychiatry: Judgement and insight appear normal. Mood & affect appropriate.     Data Reviewed: I have personally reviewed following labs and imaging studies  CBC: No results for input(s): "WBC", "NEUTROABS", "HGB", "HCT", "MCV", "PLT" in the last 168 hours.  Basic Metabolic Panel: Recent Labs  Lab 10/27/22 0821  NA 135  K 4.0  CL 104  CO2 24  GLUCOSE 95  BUN 26*  CREATININE 0.85  CALCIUM 8.2*   GFR: Estimated Creatinine Clearance: 189.9 mL/min (by C-G formula based on SCr of 0.85 mg/dL). Liver Function Tests: No results for input(s): "AST", "ALT", "ALKPHOS", "BILITOT", "PROT", "ALBUMIN" in the last 168 hours.  No results for input(s): "LIPASE", "AMYLASE" in the last 168 hours. No results for input(s): "AMMONIA" in the last 168 hours. Coagulation Profile: No results for input(s): "INR", "PROTIME" in the last 168 hours. Cardiac Enzymes: No results for input(s): "CKTOTAL", "CKMB", "CKMBINDEX", "TROPONINI" in the last 168 hours. BNP (last 3 results) Recent Labs    10/03/22 1525  PROBNP 34.0   HbA1C: No results for input(s): "HGBA1C" in the last 72 hours. CBG: Recent Labs  Lab 10/23/22 0017  GLUCAP 146*   Lipid Profile: No results for input(s): "CHOL", "HDL", "LDLCALC", "TRIG", "CHOLHDL", "LDLDIRECT" in the last 72 hours. Thyroid Function Tests: No results for input(s): "TSH", "T4TOTAL", "FREET4", "T3FREE", "THYROIDAB" in the last 72 hours. Anemia Panel: No results for input(s): "VITAMINB12", "FOLATE", "FERRITIN", "TIBC", "IRON", "RETICCTPCT" in the last 72 hours. Sepsis Labs: No results for input(s): "PROCALCITON", "LATICACIDVEN" in the last 168 hours.  No results found for this or any previous visit (from the past 240 hour(s)).       Radiology Studies: No results found.      Scheduled Meds:  atorvastatin  20 mg Oral Daily   cloBAZam  20 mg Oral BID   heparin  5,000 Units Subcutaneous Q8H   ketorolac  15 mg Intravenous Q6H   ketorolac  15  mg Intravenous Q6H   levETIRAcetam  1,500 mg Oral BID   oxcarbazepine  1,200 mg Oral BID   polyethylene glycol  17 g Oral Daily   pyridoxine  200 mg Oral Daily   senna-docusate  1 tablet Oral BID   Continuous Infusions:   LOS: 8 days     [name], MD [facility]   If 7PM-7AM, please contact night-coverage  10/29/2022, 12:33 PM

## 2022-10-29 NOTE — Progress Notes (Signed)
PT Cancellation Note  Patient Details Name: Randy Cordova. MRN: [id number] DOB: 1970-10-19   Cancelled Treatment:    Reason Eval/Treat Not Completed: Other (comment).  Pt resting in bed upon PT arrival.  Pt reports recently working with OT and getting back to bed and declining therapy at this time.  Will re-attempt PT session at a later date/time.  Hendricks Limes, PT 10/29/22, 3:16 PM

## 2022-10-29 NOTE — Care Management Important Message (Signed)
Important Message  Patient Details  Name: Randy Cordova. MRN: [id number] Date of Birth: 12-07-1970   Important Message Given:  Yes - Medicare IM     [name] 10/29/2022, 11:37 AM

## 2022-10-29 NOTE — TOC Progression Note (Addendum)
Transition of Care (TOC) - Progression Note    Patient Details  Name: Randy Cordova. MRN: [id number] Date of Birth: 1970-12-23  Transition of Care [facility]) CM/SW Contact  Allena Katz, LCSW Phone Number: 10/29/2022, 9:37 AM  Clinical Narrative:   CSW shared bed offers with patient, patient agreeable to [facility].   Expected Discharge Plan: Skilled Nursing Facility Barriers to Discharge: Continued Medical Work up  Expected Discharge Plan and Services                                               Social Determinants of Health (SDOH) Interventions SDOH Screenings   Food Insecurity: Patient Declined (10/25/2022)  Recent Concern: Food Insecurity - Food Insecurity Present (10/02/2022)  Housing: Patient Declined (10/25/2022)  Transportation Needs: Patient Declined (10/25/2022)  Recent Concern: Transportation Needs - Unmet Transportation Needs (10/02/2022)  Utilities: Patient Declined (10/25/2022)  Alcohol Screen: Low Risk  (10/02/2022)  Depression (PHQ2-9): High Risk (10/16/2021)  Financial Resource Strain: Medium Risk (10/02/2022)  Physical Activity: Insufficiently Active (10/02/2022)  Social Connections: Moderately Isolated (10/02/2022)  Stress: Stress Concern Present (10/02/2022)  Tobacco Use: Medium Risk (10/21/2022)    Readmission Risk Interventions     No data to display

## 2022-10-29 NOTE — TOC Progression Note (Signed)
Transition of Care (TOC) - Progression Note    Patient Details  Name: Randy Cordova. MRN: [id number] Date of Birth: [birth year]  [facility]) CM/SW Contact  Colette Ribas, [place] Phone Number: 10/29/2022, 11:23 AM  Clinical Narrative:    CSW spoke with Paris [phone] at [facility] and relayed they will need to start the Auth for this patient. Paris advised she will let intake know.   Expected Discharge Plan: Skilled Nursing Facility Barriers to Discharge: Continued Medical Work up  Expected Discharge Plan and Services                                               Social Determinants of Health (SDOH) Interventions SDOH Screenings   Food Insecurity: Patient Declined (10/25/2022)  Recent Concern: Food Insecurity - Food Insecurity Present (10/02/2022)  Housing: Patient Declined (10/25/2022)  Transportation Needs: Patient Declined (10/25/2022)  Recent Concern: Transportation Needs - Unmet Transportation Needs (10/02/2022)  Utilities: Patient Declined (10/25/2022)  Alcohol Screen: Low Risk  (10/02/2022)  Depression (PHQ2-9): High Risk (10/16/2021)  Financial Resource Strain: Medium Risk (10/02/2022)  Physical Activity: Insufficiently Active (10/02/2022)  Social Connections: Moderately Isolated (10/02/2022)  Stress: Stress Concern Present (10/02/2022)  Tobacco Use: Medium Risk (10/21/2022)    Readmission Risk Interventions     No data to display

## 2022-10-29 NOTE — Progress Notes (Signed)
Occupational Therapy Treatment Patient Details Name: Randy Cordova. MRN: [id number] DOB: Dec 02, 1970 Today's Date: 10/29/2022   History of present illness 52 y/o male presented to ED on 10/21/22 for ankle pain and seizure activity. Multiple seizures in ED. X-ray not suggestive of R ankle fx. PMH: vagal nerve stimulator 08/2021, hx of seizures 2/2 TBI, OSA on CPAP, HTN   OT comments  Pt seen for OT tx. Pt received in the bathroom finishing toileting task. Pt requested assist to retrieve new undergarments from his personal belongings in room. Pt demonstrated difficulty with donning over RLE with CAM boot donned. Pt instructed further in use of personal AE to improve independnce with LB dressing. With increased time/effort to complete and 1 VC for technique, pt able to don undergarments and shorts using reacher to initiate donning over the RLE and LLE. Pt required use of grab bar and initial support from sink counter to stand. Once in standing pt able to complete donning over his hips with no overt LOB within RW frame. Pt required RW for mobility 2/2 RLE pain. He was able to maintain static standing balance to wash his hands at the sink without direct assist or LOB noted. Pt progressing towards goals. Continues to benefit from skilled OT services.       If plan is discharge home, recommend the following:  A little help with bathing/dressing/bathroom;Assistance with cooking/housework;Assist for transportation;Help with stairs or ramp for entrance;A little help with walking and/or transfers   Equipment Recommendations  Other (comment) (defer to next venue)    Recommendations for Other Services Other (comment)    Precautions / Restrictions Precautions Precautions: Fall;Other (comment) Precaution Comments: Seizure (has VNS device); R ankle pain (in CAM boot) Required Braces or Orthoses: Other Brace Splint/Cast: RLE Other Brace: Per Dr. Logan Bores 9/19, now RLE WBAT in CAM boot Restrictions Weight  Bearing Restrictions: Yes RLE Weight Bearing: Weight bearing as tolerated Other Position/Activity Restrictions: Per Dr. Logan Bores 9/19, now RLE WBAT in CAM boot       Mobility Bed Mobility Overal bed mobility: Needs Assistance Bed Mobility: Sit to Supine       Sit to supine: Modified independent (Device/Increase time), HOB elevated        Transfers Overall transfer level: Needs assistance Equipment used: Rolling walker (2 wheels) Transfers: Sit to/from Stand Sit to Stand: Contact guard assist, Supervision                 Balance Overall balance assessment: Needs assistance Sitting-balance support: Feet supported, No upper extremity supported Sitting balance-Leahy Scale: Good     Standing balance support: No upper extremity supported, During functional activity Standing balance-Leahy Scale: Fair Standing balance comment: fair static to wash hands; reliant on RW for mobility                           ADL either performed or assessed with clinical judgement   ADL Overall ADL's : Needs assistance/impaired     Grooming: Standing;Supervision/safety;Wash/dry hands               Lower Body Dressing: Sit to/from stand;Contact guard assist;Cueing for compensatory techniques;With adaptive equipment Lower Body Dressing Details (indicate cue type and reason): pt educated in use of reacher and pt able to return demo with PRN VC for improving technique, able to thread underwear and shorts over RLE with CAM boot on with reacher and additional time/effort to complete Toilet Transfer: Ambulation;Regular Toilet;Rolling walker (2 wheels);Grab bars  Toileting- Clothing Manipulation and Hygiene: Sit to/from stand;Supervision/safety              Extremity/Trunk Assessment              Vision       Perception     Praxis      Cognition Arousal: Alert Behavior During Therapy: WFL for tasks assessed/performed Overall Cognitive Status: Within Functional  Limits for tasks assessed                                          Exercises Other Exercises Other Exercises: Pt instructed further in use of personal AE to improve independnce with LB dressing    Shoulder Instructions       General Comments      Pertinent Vitals/ Pain       Pain Assessment Pain Assessment: 0-10 Pain Score: 9  Pain Location: R ankle/foot Pain Descriptors / Indicators: Constant, Aching, Discomfort, Sharp Pain Intervention(s): Limited activity within patient's tolerance, Monitored during session, Repositioned  Home Living                                          Prior Functioning/Environment              Frequency  Min 1X/week        Progress Toward Goals  OT Goals(current goals can now be found in the care plan section)  Progress towards OT goals: Progressing toward goals  Acute Rehab OT Goals Patient Stated Goal: return to [place] with his wife OT Goal Formulation: With patient Time For Goal Achievement: 11/05/22 Potential to Achieve Goals: Good  Plan      Co-evaluation                 AM-PAC OT "6 Clicks" Daily Activity     Outcome Measure   Help from another person eating meals?: None Help from another person taking care of personal grooming?: None Help from another person toileting, which includes using toliet, bedpan, or urinal?: A Little Help from another person bathing (including washing, rinsing, drying)?: A Little Help from another person to put on and taking off regular upper body clothing?: None Help from another person to put on and taking off regular lower body clothing?: A Little 6 Click Score: 21    End of Session Equipment Utilized During Treatment: Rolling walker (2 wheels);Other (comment) (R CAM boot)  OT Visit Diagnosis: Pain;Unsteadiness on feet (R26.81);Other abnormalities of gait and mobility (R26.89);History of falling (Z91.81) Pain - Right/Left: Right Pain - part of  body: Ankle and joints of foot   Activity Tolerance Patient tolerated treatment well   Patient Left in bed;with call bell/phone within reach   Nurse Communication          Time: 2440-1027 OT Time Calculation (min): 28 min  Charges: OT General Charges $OT Visit: 1 Visit OT Treatments $Self Care/Home Management : 23-37 mins  Arman Filter., MPH, MS, OTR/L ascom [phone] 10/29/22, 2:53 PM

## 2022-10-29 NOTE — Plan of Care (Signed)

## 2022-10-30 DIAGNOSIS — G40919 Epilepsy, unspecified, intractable, without status epilepticus: Secondary | ICD-10-CM | POA: Diagnosis not present

## 2022-10-30 DIAGNOSIS — R52 Pain, unspecified: Secondary | ICD-10-CM | POA: Diagnosis not present

## 2022-10-30 LAB — CBC WITH DIFFERENTIAL/PLATELET
Abs Immature Granulocytes: 0.02 10*3/uL (ref 0.00–0.07)
Basophils Absolute: 0 10*3/uL (ref 0.0–0.1)
Basophils Relative: 0 %
Eosinophils Absolute: 0.1 10*3/uL (ref 0.0–0.5)
Eosinophils Relative: 4 %
HCT: 32.1 % — ABNORMAL LOW (ref 39.0–52.0)
Hemoglobin: 10.8 g/dL — ABNORMAL LOW (ref 13.0–17.0)
Immature Granulocytes: 1 %
Lymphocytes Relative: 40 %
Lymphs Abs: 1.2 10*3/uL (ref 0.7–4.0)
MCH: 29.5 pg (ref 26.0–34.0)
MCHC: 33.6 g/dL (ref 30.0–36.0)
MCV: 87.7 fL (ref 80.0–100.0)
Monocytes Absolute: 0.3 10*3/uL (ref 0.1–1.0)
Monocytes Relative: 8 %
Neutro Abs: 1.4 10*3/uL — ABNORMAL LOW (ref 1.7–7.7)
Neutrophils Relative %: 47 %
Platelets: 84 10*3/uL — ABNORMAL LOW (ref 150–400)
RBC: 3.66 MIL/uL — ABNORMAL LOW (ref 4.22–5.81)
RDW: 14.2 % (ref 11.5–15.5)
WBC: 3 10*3/uL — ABNORMAL LOW (ref 4.0–10.5)
nRBC: 0 % (ref 0.0–0.2)

## 2022-10-30 LAB — BASIC METABOLIC PANEL
Anion gap: 5 (ref 5–15)
BUN: 22 mg/dL — ABNORMAL HIGH (ref 6–20)
CO2: 25 mmol/L (ref 22–32)
Calcium: 8.1 mg/dL — ABNORMAL LOW (ref 8.9–10.3)
Chloride: 106 mmol/L (ref 98–111)
Creatinine, Ser: 0.83 mg/dL (ref 0.61–1.24)
GFR, Estimated: >60 mL/min (ref >60)
Glucose, Bld: 92 mg/dL (ref 70–99)
Potassium: 4.3 mmol/L (ref 3.5–5.1)
Sodium: 136 mmol/L (ref 135–145)

## 2022-10-30 MED ORDER — ONDANSETRON HCL 4 MG/2ML IJ SOLN
4.0000 mg | INTRAMUSCULAR | Status: DC | PRN
  Administered 2022-10-30 – 2022-10-31 (×3): 4 mg via INTRAVENOUS
  Filled 2022-10-30 (×3): qty 2

## 2022-10-30 NOTE — Progress Notes (Signed)
Physical Therapy Treatment Patient Details Name: Kostantinos Sokolik. MRN: [id number] DOB: August 26, 1970 Today's Date: 10/30/2022   History of Present Illness 52 y/o male presented to ED on 10/21/22 for ankle pain and seizure activity. Multiple seizures in ED. X-ray not suggestive of R ankle fx. PMH: vagal nerve stimulator 08/2021, hx of seizures 2/2 TBI, OSA on CPAP, HTN    PT Comments  Pt is received in bed, he is agreeable to PT session. Pt performs bed mobility mod I, transfer sup A, and amb CGA. Pt able to increase amb distance to 100 ft with use of RW during today's session but reports 8/10 NPS on R ankle/foot. Additionally, Pt required extensive rest break following amb activity seated EOB due to reports of "dizzy" and "everything is moving" although able to maintain a thorough conversation with PT. Pt is steadily progressing towards PT goals at this time. Pt would benefit from cont skilled PT to address above deficits and promote optimal return to PLOF.    If plan is discharge home, recommend the following: A little help with bathing/dressing/bathroom;Assistance with cooking/housework;Help with stairs or ramp for entrance;Assist for transportation;A little help with walking and/or transfers   Can travel by private vehicle     No  Equipment Recommendations  Rolling walker (2 wheels);BSC/3in1    Recommendations for Other Services       Precautions / Restrictions Precautions Precautions: Fall;Other (comment) Precaution Comments: Seizure (has VNS device); R ankle pain (in CAM boot) Required Braces or Orthoses: Other Brace Splint/Cast: RLE Other Brace: Per Dr. Logan Bores 9/19, now RLE WBAT in CAM boot Restrictions Weight Bearing Restrictions: Yes RLE Weight Bearing: Weight bearing as tolerated Other Position/Activity Restrictions: Per Dr. Logan Bores 9/19, now RLE WBAT in CAM boot     Mobility  Bed Mobility Overal bed mobility: Needs Assistance Bed Mobility: Supine to Sit, Sit to Supine      Supine to sit: Modified independent (Device/Increase time), HOB elevated Sit to supine: Modified independent (Device/Increase time), HOB elevated   General bed mobility comments: requires increase time with HOB elevated to perform bed mobility supine<>sit close sup A for safety    Transfers Overall transfer level: Needs assistance Equipment used: Rolling walker (2 wheels) Transfers: Sit to/from Stand Sit to Stand: Supervision           General transfer comment: close sup A for STS activity with no cuing required    Ambulation/Gait Ambulation/Gait assistance: Contact guard assist Gait Distance (Feet): 100 Feet Assistive device: Rolling walker (2 wheels) (bariatric) Gait Pattern/deviations: Step-to pattern, Trunk flexed, Decreased stride length, Decreased step length - right, Antalgic Gait velocity: decreased     General Gait Details: slight decrease step length on RLE and forward flex posture   Stairs             Wheelchair Mobility     Tilt Bed    Modified Rankin (Stroke Patients Only)       Balance Overall balance assessment: Needs assistance Sitting-balance support: Feet supported, No upper extremity supported Sitting balance-Leahy Scale: Good Sitting balance - Comments: able to maintain sitting EOB balance during functional activities   Standing balance support: During functional activity, Bilateral upper extremity supported Standing balance-Leahy Scale: Good Standing balance comment: able to maintain static standing balance with use of RW with no notable difficulties                            Cognition Arousal: Alert Behavior During Therapy:  WFL for tasks assessed/performed Overall Cognitive Status: Within Functional Limits for tasks assessed                                 General Comments: Talkative and cooperative with therapy        Exercises      General Comments General comments (skin integrity, edema,  etc.): RLE CAM boot donned prior to session      Pertinent Vitals/Pain Pain Assessment Pain Assessment: 0-10 Pain Score: 8  Pain Location: R ankle/foot Pain Descriptors / Indicators: Constant, Aching, Discomfort, Sharp Pain Intervention(s): Monitored during session, Premedicated before session    Home Living                          Prior Function            PT Goals (current goals can now be found in the care plan section) Acute Rehab PT Goals Patient Stated Goal: to improve mobility PT Goal Formulation: With patient Time For Goal Achievement: 11/05/22 Potential to Achieve Goals: Good Progress towards PT goals: Progressing toward goals    Frequency    Min 1X/week      PT Plan      Co-evaluation              AM-PAC PT "6 Clicks" Mobility   Outcome Measure  Help needed turning from your back to your side while in a flat bed without using bedrails?: None Help needed moving from lying on your back to sitting on the side of a flat bed without using bedrails?: None Help needed moving to and from a bed to a chair (including a wheelchair)?: A Little Help needed standing up from a chair using your arms (e.g., wheelchair or bedside chair)?: A Little Help needed to walk in hospital room?: A Little Help needed climbing 3-5 steps with a railing? : A Lot 6 Click Score: 19    End of Session   Activity Tolerance: Patient limited by pain Patient left: in bed;with call bell/phone within reach (Pt declined bed alarm) Nurse Communication: Mobility status PT Visit Diagnosis: Unsteadiness on feet (R26.81);Muscle weakness (generalized) (M62.81);Other abnormalities of gait and mobility (R26.89)     Time: 1341-1400 PT Time Calculation (min) (ACUTE ONLY): 19 min  Charges:    $Gait Training: 8-22 mins PT General Charges $$ ACUTE PT VISIT: 1 Visit                     Elmon Else, SPT    Judson Tsan 10/30/2022, 4:07 PM

## 2022-10-30 NOTE — Plan of Care (Signed)

## 2022-10-30 NOTE — Progress Notes (Signed)
Progress Note    Randy Cordova.  NFA:[id number] DOB: 08-Jun-1970  DOA: 10/21/2022 PCP: Eden Emms, NP      Brief Narrative:    Medical records reviewed and are as summarized below:  Randy Cordova. is a 52 y.o. male  with history of TBI's, history of seizures secondary to TBI's, status post vagal nerve stimulator placement, morbid obesity, OSA on CPAP, hypertension, hyperlipidemia, presented to the hospital because of severe right foot pain and seizures.  He said he fell down the stairs after twisting his ankle as he was walking up the stairs.  He said he had a seizure shortly after the fall and this was witnessed.  He said he had about 11 seizures yesterday.  It was also reported that he had a brief seizure-like activity in the emergency department.   Patient admitted to the hospital for breakthrough seizures presumed secondary to decreased seizure threshold due to right foot pain      Assessment/Plan:   Principal Problem:   Inadequate pain control Active Problems:   Breakthrough seizures, recurrent (HCC)   OSA on CPAP   Epilepsy (HCC)   Morbid obesity with BMI of 60.0-69.9, adult (HCC)   Tobacco user   Primary hypertension   Osteoarthritis of both knees   Nonalcoholic fatty liver disease   Hyperlipidemia   History of traumatic brain injury   Generalized anxiety disorder   Arthralgia of hip   Foot pain, right    Body mass index is 62.85 kg/m.  (Morbid obesity)   Breakthrough seizures in a patient with refractory epilepsy who has a VNS in place: Continue current antiepileptics.  Patient had been evaluated by the neurologist.     Right foot pain, right plantar fasciitis on x-ray of the ankle/foot: Continue analgesics as needed for pain.  Continue cam boot.  He had been evaluated by the podiatrist.  No indication for surgery at this time.    Constipation: Improved.  Laxatives as needed.    Morbid obesity BMI 62.85.  Complicates overall care and  prognosis     Other comorbidities include osteoarthritis left lower extremities, hypertension, hyperlipidemia, tobacco use disorder, OSA on CPAP     I have informed the charge nurse that patient wants to speak with her.  She said she will have the unit director speak with the patient.    Diet Order             Diet regular Fluid consistency: Thin  Diet effective now                            Consultants: Neurologist  Procedures: None    Medications:    atorvastatin  20 mg Oral Daily   cloBAZam  20 mg Oral BID   heparin  5,000 Units Subcutaneous Q8H   ketorolac  15 mg Intravenous Q6H   levETIRAcetam  1,500 mg Oral BID   oxcarbazepine  1,200 mg Oral BID   polyethylene glycol  17 g Oral Daily   pyridoxine  200 mg Oral Daily   senna-docusate  1 tablet Oral BID   Continuous Infusions:   Anti-infectives (From admission, onward)    None              Family Communication/Anticipated D/C date and plan/Code Status   DVT prophylaxis: heparin injection 5,000 Units Start: 10/22/22 0600 Place TED hose Start: 10/21/22 1712     Code Status: Full Code  Family Communication: None Disposition Plan: Plan to discharge to SNF   Status is: Inpatient Remains inpatient appropriate because: Awaiting placement to SNF       Subjective:   Interval events noted.  He still has some pain in the right foot but he is working with physical therapist.  He has some complaints about Brooke, his nurse, yesterday and wanted to speak to the charge nurse about it.  Objective:    Vitals:   10/29/22 1637 10/29/22 2011 10/30/22 0456 10/30/22 0822  BP: (!) 122/57 128/60 (!) 108/52 134/68  Pulse: 75 96 73 76  Resp: 16   16  Temp: 97.7 F (36.5 C) 97.9 F (36.6 C) 97.9 F (36.6 C) 97.8 F (36.6 C)  TempSrc:   Oral   SpO2: 100% 96% 94% 97%  Weight:      Height:       No data found.   Intake/Output Summary (Last 24 hours) at 10/30/2022 1133 Last data  filed at 10/30/2022 0000 Gross per 24 hour  Intake --  Output 200 ml  Net -200 ml   Filed Weights   10/21/22 1303  Weight: (!) 210.2 kg    Exam:  GEN: NAD SKIN: Warm and dry EYES: No pallor or icterus ENT: MMM CV: RRR PULM: CTA B ABD: soft, obese, NT, +BS CNS: AAO x 3, non focal EXT: Immobilizer on right foot/right leg        Data Reviewed:   I have personally reviewed following labs and imaging studies:  Labs: Labs show the following:   Basic Metabolic Panel: Recent Labs  Lab 10/27/22 0821 10/30/22 0713  NA 135 136  K 4.0 4.3  CL 104 106  CO2 24 25  GLUCOSE 95 92  BUN 26* 22*  CREATININE 0.85 0.83  CALCIUM 8.2* 8.1*   GFR Estimated Creatinine Clearance: 194.5 mL/min (by C-G formula based on SCr of 0.83 mg/dL). Liver Function Tests: No results for input(s): "AST", "ALT", "ALKPHOS", "BILITOT", "PROT", "ALBUMIN" in the last 168 hours. No results for input(s): "LIPASE", "AMYLASE" in the last 168 hours. No results for input(s): "AMMONIA" in the last 168 hours. Coagulation profile No results for input(s): "INR", "PROTIME" in the last 168 hours.  CBC: Recent Labs  Lab 10/30/22 0713  WBC 3.0*  NEUTROABS 1.4*  HGB 10.8*  HCT 32.1*  MCV 87.7  PLT 84*   Cardiac Enzymes: No results for input(s): "CKTOTAL", "CKMB", "CKMBINDEX", "TROPONINI" in the last 168 hours. BNP (last 3 results) Recent Labs    10/03/22 1525  PROBNP 34.0   CBG: No results for input(s): "GLUCAP" in the last 168 hours. D-Dimer: No results for input(s): "DDIMER" in the last 72 hours. Hgb A1c: No results for input(s): "HGBA1C" in the last 72 hours. Lipid Profile: No results for input(s): "CHOL", "HDL", "LDLCALC", "TRIG", "CHOLHDL", "LDLDIRECT" in the last 72 hours. Thyroid function studies: No results for input(s): "TSH", "T4TOTAL", "T3FREE", "THYROIDAB" in the last 72 hours.  Invalid input(s): "FREET3" Anemia work up: No results for input(s): "VITAMINB12", "FOLATE",  "FERRITIN", "TIBC", "IRON", "RETICCTPCT" in the last 72 hours. Sepsis Labs: Recent Labs  Lab 10/30/22 0713  WBC 3.0*    Microbiology No results found for this or any previous visit (from the past 240 hour(s)).  Procedures and diagnostic studies:  No results found.             LOS: 9 days   Danira Nylander  [employer] loss adjuster on [email or web address]. If 7PM-7AM, please contact night-coverage at  [email or web address]     10/30/2022, 11:33 AM

## 2022-10-30 NOTE — Progress Notes (Signed)
Occupational Therapy Treatment Patient Details Name: Randy Cordova. MRN: [id number] DOB: [birth year] Today's Date: 10/30/2022   History of present illness 52 y/o male presented to ED on 10/21/22 for ankle pain and seizure activity. Multiple seizures in ED. X-ray not suggestive of R ankle fx. PMH: vagal nerve stimulator 08/2021, hx of seizures 2/2 TBI, OSA on CPAP, HTN   OT comments  Pt is supine in bed on arrival. Pleasant, talkative and agreeable to OT session. He reports pain at 8/10 initially and reports it increased to 10/10 during session in R foot/ankle, however pt not grimacing and able to carry on full conversation. He performed all bed mobility with MOD I and extra time using bed rails. STS from EOB to RW with SBA and ADL mobility within the room using RW with SBA. Pt required MOD I/SUP for toilet transfer using grab bar and for clothing management and hygiene. He performed oral hygiene tasks and brushing hair standing at sink with unilateral support and SUP.  Pt returned to bed with all needs in place and will cont to require skilled acute OT services to maximize his safety and IND to return to PLOF.       If plan is discharge home, recommend the following:  A little help with bathing/dressing/bathroom;Assistance with cooking/housework;Assist for transportation;Help with stairs or ramp for entrance;A little help with walking and/or transfers   Equipment Recommendations  Other (comment) (defer to next venue)    Recommendations for Other Services      Precautions / Restrictions Precautions Precautions: Fall;Other (comment) Precaution Comments: Seizure (has VNS device); R ankle pain (in CAM boot) Required Braces or Orthoses: Other Brace Splint/Cast: RLE Other Brace: Per Dr. Logan Bores 9/19, now RLE WBAT in CAM boot Restrictions Weight Bearing Restrictions: Yes RLE Weight Bearing: Weight bearing as tolerated Other Position/Activity Restrictions: Per Dr. Logan Bores 9/19, now RLE WBAT in CAM  boot       Mobility Bed Mobility Overal bed mobility: Needs Assistance Bed Mobility: Supine to Sit, Sit to Supine     Supine to sit: Modified independent (Device/Increase time), HOB elevated Sit to supine: Modified independent (Device/Increase time), HOB elevated        Transfers Overall transfer level: Needs assistance Equipment used: Rolling walker (2 wheels) Transfers: Sit to/from Stand Sit to Stand: Supervision          Lateral/Scoot Transfers: Supervision       Balance Overall balance assessment: Needs assistance Sitting-balance support: Feet supported, No upper extremity supported Sitting balance-Leahy Scale: Good     Standing balance support: During functional activity, Bilateral upper extremity supported Standing balance-Leahy Scale: Good Standing balance comment: able to maintain dynamic standing balance at sink and in bathroom for toileting and hygiene tasks                           ADL either performed or assessed with clinical judgement   ADL Overall ADL's : Needs assistance/impaired     Grooming: Standing;Supervision/safety;Oral care;Wash/dry hands;Brushing hair               Lower Body Dressing: Sit to/from stand;Contact guard assist;Cueing for compensatory techniques   Toilet Transfer: Ambulation;Regular Toilet;Rolling walker (2 wheels);Grab bars;Supervision/safety   Toileting- Clothing Manipulation and Hygiene: Sit to/from stand;Supervision/safety       Functional mobility during ADLs: Supervision/safety;Rolling walker (2 wheels)      Extremity/Trunk Assessment Upper Extremity Assessment Upper Extremity Assessment: Overall WFL for tasks assessed  Vision       Perception     Praxis      Cognition Arousal: Alert Behavior During Therapy: WFL for tasks assessed/performed Overall Cognitive Status: Within Functional Limits for tasks assessed                                 General  Comments: Talkative and cooperative with therapy        Exercises      Shoulder Instructions       General Comments RLE CAM boot on    Pertinent Vitals/ Pain          Home Living                                          Prior Functioning/Environment              Frequency  Min 1X/week        Progress Toward Goals  OT Goals(current goals can now be found in the care plan section)     Acute Rehab OT Goals OT Goal Formulation: With patient Time For Goal Achievement: 11/05/22 Potential to Achieve Goals: Good  Plan      Co-evaluation          OT goals addressed during session: Proper use of Adaptive equipment and DME;ADL's and self-care      AM-PAC OT "6 Clicks" Daily Activity     Outcome Measure   Help from another person eating meals?: None Help from another person taking care of personal grooming?: None Help from another person toileting, which includes using toliet, bedpan, or urinal?: A Little Help from another person bathing (including washing, rinsing, drying)?: A Little Help from another person to put on and taking off regular upper body clothing?: None Help from another person to put on and taking off regular lower body clothing?: A Little 6 Click Score: 21    End of Session Equipment Utilized During Treatment: Rolling walker (2 wheels)  OT Visit Diagnosis: Pain;Unsteadiness on feet (R26.81);Other abnormalities of gait and mobility (R26.89);History of falling (Z91.81) Pain - Right/Left: Right Pain - part of body: Ankle and joints of foot   Activity Tolerance Patient tolerated treatment well   Patient Left in bed;with call bell/phone within reach   Nurse Communication          Time: 3151-7616 OT Time Calculation (min): 30 min  Charges: OT General Charges $OT Visit: 1 Visit OT Treatments $Self Care/Home Management : 23-37 mins  Joeseph Verville, OTR/L  10/30/22, 4:39 PM

## 2022-10-31 DIAGNOSIS — G40919 Epilepsy, unspecified, intractable, without status epilepticus: Secondary | ICD-10-CM | POA: Diagnosis not present

## 2022-10-31 MED ORDER — ACETAMINOPHEN 325 MG PO TABS: 650.0000 mg | ORAL_TABLET | Freq: Three times a day (TID) | ORAL | Status: AC | PRN

## 2022-10-31 MED ORDER — HYDROCODONE-ACETAMINOPHEN 5-325 MG PO TABS: 1.0000 | ORAL_TABLET | Freq: Three times a day (TID) | ORAL | 0 refills | Status: DC | PRN

## 2022-10-31 MED ORDER — POLYETHYLENE GLYCOL 3350 17 G PO PACK: 17.0000 g | PACK | Freq: Every day | ORAL | 0 refills | Status: DC | PRN

## 2022-10-31 NOTE — Plan of Care (Signed)
  Problem: Education: Goal: Knowledge of General Education information will improve Description: Including pain rating scale, medication(s)/side effects and non-pharmacologic comfort measures Outcome: Progressing   Problem: Clinical Measurements: Goal: Will remain free from infection Outcome: Progressing Goal: Diagnostic test results will improve Outcome: Progressing   Problem: Activity: Goal: Risk for activity intolerance will decrease Outcome: Progressing   Problem: Nutrition: Goal: Adequate nutrition will be maintained Outcome: Progressing   Problem: Coping: Goal: Level of anxiety will decrease Outcome: Progressing   Problem: Elimination: Goal: Will not experience complications related to bowel motility Outcome: Progressing   Problem: Pain Managment: Goal: General experience of comfort will improve Outcome: Progressing

## 2022-10-31 NOTE — Progress Notes (Signed)
PT Cancellation Note  Patient Details Name: Randy Cordova. MRN: [id number] DOB: 1970/08/12   Cancelled Treatment:    Reason Eval/Treat Not Completed: Patient declined, no reason specified. States he had a busy morning and has been up around the room today. Will re-attempt PT at later date.    Tremon Sainvil 10/31/2022, 2:16 PM

## 2022-10-31 NOTE — Plan of Care (Signed)

## 2022-10-31 NOTE — Progress Notes (Signed)
Occupational Therapy Treatment Patient Details Name: Randy Cordova. MRN: [id number] DOB: [birth year] Today's Date: 10/31/2022   History of present illness 52 y/o male presented to ED on 10/21/22 for ankle pain and seizure activity. Multiple seizures in ED. X-ray not suggestive of R ankle fx. PMH: vagal nerve stimulator 08/2021, hx of seizures 2/2 TBI, OSA on CPAP, HTN   OT comments  Pt is supine in bed on arrival, pleasant and agreeable to OT session. He reports pain in R foot/ankle at 8/10, but again does not appear in acute distress and able to laugh, smile and maintain consistent conversation throughout session. Pt performed bed mobility with MOD I. Pt was provided a green medium resistance theraband and educated on BUE/BLE HEP using it. Pt verbalized understanding and demo back to OT with good carryover. He received phone calls from social worker during session stating he should get to DC tomorrow. Pt very talkative throughout session, but thankful for all services provided during his stay. Pt left seated at edge of bed with all needs in place and will cont to require skilled acute OT services to maximize his safety and IND to return to PLOF.       If plan is discharge home, recommend the following:  A little help with bathing/dressing/bathroom;Assistance with cooking/housework;Assist for transportation;Help with stairs or ramp for entrance;A little help with walking and/or transfers   Equipment Recommendations  Other (comment) (defer to next venue)    Recommendations for Other Services      Precautions / Restrictions Restrictions Weight Bearing Restrictions: Yes RLE Weight Bearing: Weight bearing as tolerated       Mobility Bed Mobility                    Transfers                         Balance                                           ADL either performed or assessed with clinical judgement   ADL                                               Extremity/Trunk Assessment Upper Extremity Assessment Upper Extremity Assessment: Overall WFL for tasks assessed            Vision       Perception     Praxis      Cognition Arousal: Alert Behavior During Therapy: WFL for tasks assessed/performed Overall Cognitive Status: Within Functional Limits for tasks assessed                                 General Comments: Talkative and cooperative with therapy        Exercises Other Exercises Other Exercises: Pt was provided a green med resistance theraband and educated on BUE/BLE HEP using it. Pt verbalized understanding and demo back to OT with good carryover.    Shoulder Instructions       General Comments      Pertinent Vitals/ Pain       Pain Assessment Pain Assessment: 0-10 Pain Score: 8  Pain Location: R ankle/foot Pain Descriptors / Indicators: Constant, Aching, Discomfort, Sharp Pain Intervention(s): Monitored during session  Home Living                                          Prior Functioning/Environment              Frequency           Progress Toward Goals  OT Goals(current goals can now be found in the care plan section)  Progress towards OT goals: Progressing toward goals  Acute Rehab OT Goals Patient Stated Goal: return to [place] with his wife following rehab OT Goal Formulation: With patient Time For Goal Achievement: 11/05/22 Potential to Achieve Goals: Good  Plan      Co-evaluation          OT goals addressed during session: Strengthening/ROM      AM-PAC OT "6 Clicks" Daily Activity     Outcome Measure   Help from another person eating meals?: None Help from another person taking care of personal grooming?: None Help from another person toileting, which includes using toliet, bedpan, or urinal?: A Little Help from another person bathing (including washing, rinsing, drying)?: A Little Help from another  person to put on and taking off regular upper body clothing?: None Help from another person to put on and taking off regular lower body clothing?: A Little 6 Click Score: 21    End of Session    OT Visit Diagnosis: Pain;Unsteadiness on feet (R26.81);Other abnormalities of gait and mobility (R26.89);History of falling (Z91.81) Pain - Right/Left: Right Pain - part of body: Ankle and joints of foot   Activity Tolerance Patient tolerated treatment well   Patient Left in bed;with call bell/phone within reach   Nurse Communication          Time: 1191-4782 OT Time Calculation (min): 24 min  Charges: OT General Charges $OT Visit: 1 Visit OT Treatments $Therapeutic Exercise: 8-22 mins  Randy Cordova, OTR/L  10/31/22, 3:42 PM   Randy Cordova 10/31/2022, 3:39 PM

## 2022-10-31 NOTE — TOC Transition Note (Addendum)
Transition of Care [facility]) - CM/SW Discharge Note   Patient Details  Name: Randy Cordova. MRN: [id number] Date of Birth: 06/22/1970  [facility] CM/SW Contact:  Allena Katz, LCSW Phone Number: 10/31/2022, 2:50 PM   Clinical Narrative:   Pt has orders to discharge to [facility]. Christine at [facility]. RN given number for report. CSW to arrange [facility] once dc summary is in. Pt notified pt calling his friend shawn to notify him  3:24pm  EMS canceled due to out of town quota. Patient reports his friend Ines Bloomer will take him.    3:31pm [facility] unable to take today they report they now do not have a bed and pt can come tomorrow after 1.  Final next level of care: Skilled Nursing Facility Barriers to Discharge: Barriers Resolved   Patient Goals and CMS Choice CMS Medicare.gov Compare Post Acute Care list provided to:: Patient Choice offered to / list presented to : Patient  Discharge Placement                Patient chooses bed at:  ([facility]) Patient to be transferred to facility by: [facility]   Patient and family notified of of transfer: 10/31/22  Discharge Plan and Services Additional resources added to the After Visit Summary for                                       Social Determinants of Health (SDOH) Interventions SDOH Screenings   Food Insecurity: Patient Declined (10/25/2022)  Recent Concern: Food Insecurity - Food Insecurity Present (10/02/2022)  Housing: Patient Declined (10/25/2022)  Transportation Needs: Patient Declined (10/25/2022)  Recent Concern: Transportation Needs - Unmet Transportation Needs (10/02/2022)  Utilities: Patient Declined (10/25/2022)  Alcohol Screen: Low Risk  (10/02/2022)  Depression (PHQ2-9): High Risk (10/16/2021)  Financial Resource Strain: Medium Risk (10/02/2022)  Physical Activity: Insufficiently Active (10/02/2022)  Social Connections: Moderately Isolated (10/02/2022)  Stress: Stress Concern Present  (10/02/2022)  Tobacco Use: Medium Risk (10/21/2022)     Readmission Risk Interventions     No data to display

## 2022-10-31 NOTE — TOC Progression Note (Signed)
Transition of Care (TOC) - Progression Note    Patient Details  Name: Randy Cordova. MRN: [id number] Date of Birth: 24-Dec-1970  [facility]) CM/SW Contact  Allena Katz, LCSW Phone Number: 10/31/2022, 2:39 PM  Clinical Narrative:   [facility]erkley Harvey approved for [facility].     Expected Discharge Plan: Skilled Nursing Facility Barriers to Discharge: Continued Medical Work up  Expected Discharge Plan and Services                                               Social Determinants of Health (SDOH) Interventions SDOH Screenings   Food Insecurity: Patient Declined (10/25/2022)  Recent Concern: Food Insecurity - Food Insecurity Present (10/02/2022)  Housing: Patient Declined (10/25/2022)  Transportation Needs: Patient Declined (10/25/2022)  Recent Concern: Transportation Needs - Unmet Transportation Needs (10/02/2022)  Utilities: Patient Declined (10/25/2022)  Alcohol Screen: Low Risk  (10/02/2022)  Depression (PHQ2-9): High Risk (10/16/2021)  Financial Resource Strain: Medium Risk (10/02/2022)  Physical Activity: Insufficiently Active (10/02/2022)  Social Connections: Moderately Isolated (10/02/2022)  Stress: Stress Concern Present (10/02/2022)  Tobacco Use: Medium Risk (10/21/2022)    Readmission Risk Interventions     No data to display

## 2022-10-31 NOTE — Discharge Summary (Signed)
Physician Discharge Summary   Patient: Randy Cordova. MRN: [id number] DOB: 1970-09-22  Admit date:     10/21/2022  Discharge date: 10/31/22  Discharge Physician: Lurene Shadow   PCP: Eden Emms, NP   Recommendations at discharge:   Follow-up with your neurologist, Dr. Ramiro Harvest, and [facility] in 1 to 2 weeks Follow-up with physician at the nursing home within 3 days of discharge Follow-up with Dr. Gala Lewandowsky, podiatrist, in 3 weeks  Discharge Diagnoses: Principal Problem:   Inadequate pain control Active Problems:   Breakthrough seizures, recurrent (HCC)   OSA on CPAP   Epilepsy (HCC)   Morbid obesity with BMI of 60.0-69.9, adult (HCC)   Tobacco user   Primary hypertension   Osteoarthritis of both knees   Nonalcoholic fatty liver disease   Hyperlipidemia   History of traumatic brain injury   Generalized anxiety disorder   Arthralgia of hip   Foot pain, right  Resolved Problems:   * No resolved hospital problems. *  Hospital Course:  Randy Summerson. is a 52 y.o. male  with history of TBI's, history of seizures secondary to TBI's, status post vagal nerve stimulator placement, morbid obesity, OSA on CPAP, hypertension, hyperlipidemia, presented to the hospital because of severe right foot pain and seizures.  He said he fell down the stairs after twisting his ankle as he was walking up the stairs.  He said he had a seizure shortly after the fall and this was witnessed.  He said he had about 11 seizures yesterday.  It was also reported that he had a brief seizure-like activity in the emergency department.   Patient admitted to the hospital for breakthrough seizures presumed secondary to decreased seizure threshold due to right foot pain     Assessment and Plan:   Breakthrough seizures in a patient with refractory epilepsy who has a VNS in place: Continue current antiepileptics.  Patient had been evaluated by the neurologist.  Follow-up with Dr. Ramiro Harvest, neurologist at [facility]  in 1 to 2 weeks     Right foot pain, right plantar fasciitis on x-ray of the ankle/foot: Continue analgesics as needed for pain.  Continue cam boot.  Nonweightbearing for 3 weeks as recommended by podiatrist.  No indication for surgery at this time.  Follow-up with Dr. Logan Bores, podiatrist, in 3 weeks     Constipation: Improved.  Laxatives as needed.     Morbid obesity BMI 62.85.  This complicates overall care and prognosis     Other comorbidities include osteoarthritis left lower extremities, hypertension, hyperlipidemia, tobacco use disorder, OSA on CPAP at night         Consultants: Neurologist, podiatrist Procedures performed: None Disposition: Skilled nursing facility Diet recommendation:  Regular diet DISCHARGE MEDICATION: Allergies as of 10/31/2022       Reactions   Aspartame And Phenylalanine    Headache        Medication List     STOP taking these medications    Nayzilam 5 MG/0.1ML Soln Generic drug: Midazolam       TAKE these medications    acetaminophen 325 MG tablet Commonly known as: TYLENOL Take 2 tablets (650 mg total) by mouth every 8 (eight) hours as needed. What changed:  how much to take when to take this reasons to take this   atorvastatin 20 MG tablet Commonly known as: LIPITOR TAKE 1 TABLET BY MOUTH DAILY   cloBAZam 20 MG tablet Commonly known as: ONFI Take 1 tablet by mouth at bedtime.  HYDROcodone-acetaminophen 5-325 MG tablet Commonly known as: NORCO/VICODIN Take 1 tablet by mouth every 8 (eight) hours as needed for moderate pain or severe pain.   hydrocortisone 2.5 % lotion Apply topically.   ketoconazole 2 % cream Commonly known as: NIZORAL Apply topically daily.   levETIRAcetam 750 MG tablet Commonly known as: KEPPRA Take 2 tablets (1,500 mg total) by mouth 2 (two) times daily.   meloxicam 15 MG tablet Commonly known as: MOBIC Take 1 tablet (15 mg total) by mouth daily.   oxcarbazepine 600 MG tablet Commonly  known as: TRILEPTAL Take 2 tablets by mouth in the morning and at bedtime.   polyethylene glycol 17 g packet Commonly known as: MIRALAX / GLYCOLAX Take 17 g by mouth daily as needed for mild constipation.   pyridoxine 200 MG tablet Commonly known as: B-6 Take 1 tablet (200 mg total) by mouth daily.   triamcinolone cream 0.1 % Commonly known as: KENALOG Apply topically.        Contact information for follow-up providers     [name], DPM Follow up.   Specialty: Podiatry Why: Make appt for 4 wks post d/c Contact information: [address] [place] [address] [phone]         Eden Emms, NP Follow up.   Specialties: Nurse Practitioner, Family Medicine Why: hospital f/u appt. Contact information: [address] [place] [address] [phone]              Contact information for after-discharge care     Destination     [facility] .   Service: Skilled Nursing Contact information: [address] [place] [address] [phone]                    Discharge Exam: Randy Cordova Weights   10/21/22 1303  Weight: (!) 210.2 kg   GEN: NAD SKIN: Warm and dry EYES: EOMI ENT: MMM CV: RRR PULM: CTA B ABD: soft, obese, NT, +BS CNS: AAO x 3, non focal EXT: Cam boot on right foot   Condition at discharge: good  The results of significant diagnostics from this hospitalization (including imaging, microbiology, ancillary and laboratory) are listed below for reference.   Imaging Studies: CT ANKLE RIGHT WO CONTRAST  Result Date: 10/22/2022 CLINICAL DATA:  Right ankle pain, twisting injury EXAM: CT OF THE RIGHT ANKLE WITHOUT CONTRAST TECHNIQUE: Multidetector CT imaging of the right ankle was performed according to the standard protocol. Multiplanar CT image reconstructions were also generated. RADIATION DOSE REDUCTION: This exam was performed according to the departmental dose-optimization program which  includes automated exposure control, adjustment of the mA and/or kV according to patient size and/or use of iterative reconstruction technique. COMPARISON:  X-ray 10/21/2022 FINDINGS: Bones/Joint/Cartilage Bones of the right ankle, hindfoot, and midfoot are intact. No acute fracture. No malalignment. Mild-to-moderate osteoarthritic changes with joint space narrowing and marginal osteophyte formation. Bidirectional calcaneal enthesophytes. Large ossification along the course of the peroneus longus tendon sheath at the level of the peroneal tubercle measuring 1.8 x 1.4 cm. Calcification within the proximal plantar fascia. No tibiotalar or subtalar joint effusion. Ligaments Suboptimally assessed by CT. Muscles and Tendons No acute musculotendinous abnormality by CT. 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 No tenosynovial fluid collections. Soft tissues Soft tissue swelling most pronounced along the lateral aspect of the hindfoot and midfoot. No organized fluid collection or hematoma. IMPRESSION: 1. No acute fracture or malalignment of the right ankle.  2. Soft tissue swelling most pronounced along the lateral aspect of the hindfoot and midfoot. No organized fluid collection or hematoma. 3. Large ossification along the course of the right peroneus longus tendon sheath at the level of the peroneal tubercle measuring 1.8 x 1.4 cm. This is more proximally located than a typical os peroneum. 4. Mild-to-moderate osteoarthritic changes of the right foot and ankle. Electronically Signed   By: Duanne Guess D.O.   On: 10/22/2022 19:16   CT Head Wo Contrast  Result Date: 10/21/2022 CLINICAL DATA:  Larey Seat, seizure EXAM: CT HEAD WITHOUT CONTRAST TECHNIQUE: Contiguous axial images were obtained from the base of the skull through the vertex without intravenous contrast. RADIATION DOSE REDUCTION: This exam was performed according to the departmental dose-optimization program which includes  automated exposure control, adjustment of the mA and/or kV according to patient size and/or use of iterative reconstruction technique. COMPARISON:  12/04/2020 FINDINGS: Brain: No acute infarct or hemorrhage. Lateral ventricles and midline structures are unremarkable. No acute extra-axial fluid collections. No mass effect. Vascular: No hyperdense vessel or unexpected calcification. Skull: Normal. Negative for fracture or focal lesion. Sinuses/Orbits: No acute finding. Other: None. IMPRESSION: 1. Stable head CT, no acute intracranial process. Electronically Signed   By: Sharlet Salina M.D.   On: 10/21/2022 16:21   DG Foot Complete Right  Result Date: 10/21/2022 CLINICAL DATA:  Post fall, now with lateral sided foot and ankle pain. EXAM: RIGHT FOOT COMPLETE - 3+ VIEW COMPARISON:  Ankle radiographs earlier same day FINDINGS: There is diffuse soft tissue swelling about the imaged lower leg, ankle and hindfoot. Note is made of an os peroneus. No definite displaced acute fracture. Mild degenerative change of several midfoot articulations. Joint spaces appear otherwise well preserved. No significant hallux valgus deformity. No erosions. Pes cavus deformity is suspected on this nonweightbearing radiographs. Small plantar calcaneal spur. Dystrophic calcifications involving the posterior aspect of the plantar fascia. Enthesopathic change involving the Achilles tendon insertion site. Dermal calcifications overlie the anterior aspect of the lower leg. No radiopaque foreign body. IMPRESSION: 1. Diffuse soft tissue swelling about the imaged lower leg, ankle and hindfoot without underlying displaced acute fracture or dislocation. 2. Pes cavus deformity is suspected on this nonweightbearing radiographs. 3. Small plantar calcaneal spur with dystrophic calcifications involving the posterior aspect of the plantar fascia, nonspecific though could be seen in the setting of plantar fasciitis. Electronically Signed   By: Simonne Come  M.D.   On: 10/21/2022 15:06   DG Ankle Complete Right  Result Date: 10/21/2022 CLINICAL DATA:  Post fall, now with lateral sided foot and ankle pain. EXAM: RIGHT ANKLE - COMPLETE 3+ VIEW COMPARISON:  Right foot radiographs earlier same day FINDINGS: Mild soft tissue swelling about the imaged lower leg, ankle and hindfoot. Peripherally corticated ossicles adjacent to the medial malleolus likely represent the sequela of remote avulsive injury. Note is made of a os peroneus. No acute fracture or dislocation. Joint spaces appear preserved. The ankle mortise appears preserved. No ankle joint effusion. Small plantar calcaneal spur with dystrophic calcification overlying expected location of the posterior aspect of the plantar fascia. Enthesopathic change involving the Achilles tendon insertion site. Dermal calcifications overlie the anterior aspect of the lower leg. IMPRESSION: 1. Mild soft tissue swelling about the imaged lower leg, ankle and hindfoot without associated acute fracture or dislocation. 2. Small plantar calcaneal spur with dystrophic calcification overlying expected location of the posterior aspect of the plantar fascia, nonspecific though could be seen in the setting of  plantar fasciitis. Electronically Signed   By: Simonne Come M.D.   On: 10/21/2022 15:04   DG Foot Complete Right  Result Date: 10/03/2022 CLINICAL DATA:  Injury with pain to the dorsum near the distal first metatarsal-phalangeal EXAM: RIGHT FOOT COMPLETE - 3+ VIEW COMPARISON:  None Available. FINDINGS: Mild degenerative changes in the first metatarsal-phalangeal joint. Degenerative changes in the interphalangeal joints and intertarsal joints. Old ununited ossicle adjacent to the cuboidal bone. No acute fracture or dislocation. Calcification adjacent to the plantar calcaneal spur suggesting plantar fasciitis. Small calcaneal Achilles spur. Calcifications in the soft tissues suggesting phleboliths. IMPRESSION: 1. Degenerative changes  in the right foot including first metatarsal-phalangeal joint. 2. No acute displaced fractures identified. 3. Calcification adjacent to the plantar surface of the calcaneus suggesting plantar fasciitis. Electronically Signed   By: Burman Nieves M.D.   On: 10/03/2022 15:08    Microbiology: Results for orders placed or performed during the hospital encounter of 12/04/20  Resp Panel by RT-PCR (Flu A&B, Covid) Nasopharyngeal Swab     Status: None   Collection Time: 12/04/20  8:30 PM   Specimen: Nasopharyngeal Swab; Nasopharyngeal(NP) swabs in vial transport medium  Result Value Ref Range Status   SARS Coronavirus 2 by RT PCR NEGATIVE NEGATIVE Final    Comment: (NOTE) SARS-CoV-2 target nucleic acids are NOT DETECTED.  The SARS-CoV-2 RNA is generally detectable in upper respiratory specimens during the acute phase of infection. The lowest concentration of SARS-CoV-2 viral copies this assay can detect is 138 copies/mL. A negative result does not preclude SARS-Cov-2 infection and should not be used as the sole basis for treatment or other patient management decisions. A negative result may occur with  improper specimen collection/handling, submission of specimen other than nasopharyngeal swab, presence of viral mutation(s) within the areas targeted by this assay, and inadequate number of viral copies(<138 copies/mL). A negative result must be combined with clinical observations, patient history, and epidemiological information. The expected result is Negative.  Fact Sheet for Patients:  BloggerCourse.com  Fact Sheet for Healthcare Providers:  SeriousBroker.it  This test is no t yet approved or cleared by the [place] FDA and  has been authorized for detection and/or diagnosis of SARS-CoV-2 by FDA under an Emergency Use Authorization (EUA). This EUA will remain  in effect (meaning this test can be used) for the duration of  the COVID-19 declaration under Section 564(b)(1) of the Act, 21 U.S.C.section 360bbb-3(b)(1), unless the authorization is terminated  or revoked sooner.       Influenza A by PCR NEGATIVE NEGATIVE Final   Influenza B by PCR NEGATIVE NEGATIVE Final    Comment: (NOTE) The Xpert Xpress SARS-CoV-2/FLU/RSV plus assay is intended as an aid in the diagnosis of influenza from Nasopharyngeal swab specimens and should not be used as a sole basis for treatment. Nasal washings and aspirates are unacceptable for Xpert Xpress SARS-CoV-2/FLU/RSV testing.  Fact Sheet for Patients: BloggerCourse.com  Fact Sheet for Healthcare Providers: SeriousBroker.it  This test is not yet approved or cleared by the [place] FDA and has been authorized for detection and/or diagnosis of SARS-CoV-2 by FDA under an Emergency Use Authorization (EUA). This EUA will remain in effect (meaning this test can be used) for the duration of the COVID-19 declaration under Section 564(b)(1) of the Act, 21 U.S.C. section 360bbb-3(b)(1), unless the authorization is terminated or revoked.  Performed at [facility], [address] [place], [place] [address]     Labs: CBC: Recent Labs  Lab 10/30/22  0713  WBC 3.0*  NEUTROABS 1.4*  HGB 10.8*  HCT 32.1*  MCV 87.7  PLT 84*   Basic Metabolic Panel: Recent Labs  Lab 10/27/22 0821 10/30/22 0713  NA 135 136  K 4.0 4.3  CL 104 106  CO2 24 25  GLUCOSE 95 92  BUN 26* 22*  CREATININE 0.85 0.83  CALCIUM 8.2* 8.1*   Liver Function Tests: No results for input(s): "AST", "ALT", "ALKPHOS", "BILITOT", "PROT", "ALBUMIN" in the last 168 hours. CBG: No results for input(s): "GLUCAP" in the last 168 hours.  Discharge time spent: greater than 30 minutes.  Signed: Lurene Shadow, MD [facility] 10/31/2022

## 2022-11-01 DIAGNOSIS — M6259 Muscle wasting and atrophy, not elsewhere classified, multiple sites: Secondary | ICD-10-CM | POA: Diagnosis not present

## 2022-11-01 DIAGNOSIS — G40919 Epilepsy, unspecified, intractable, without status epilepticus: Secondary | ICD-10-CM | POA: Diagnosis not present

## 2022-11-01 DIAGNOSIS — R278 Other lack of coordination: Secondary | ICD-10-CM | POA: Diagnosis not present

## 2022-11-01 DIAGNOSIS — R1084 Generalized abdominal pain: Secondary | ICD-10-CM | POA: Diagnosis not present

## 2022-11-01 DIAGNOSIS — R110 Nausea: Secondary | ICD-10-CM | POA: Diagnosis not present

## 2022-11-01 DIAGNOSIS — R279 Unspecified lack of coordination: Secondary | ICD-10-CM | POA: Diagnosis not present

## 2022-11-01 DIAGNOSIS — E039 Hypothyroidism, unspecified: Secondary | ICD-10-CM | POA: Diagnosis not present

## 2022-11-01 DIAGNOSIS — M722 Plantar fascial fibromatosis: Secondary | ICD-10-CM | POA: Diagnosis not present

## 2022-11-01 DIAGNOSIS — R262 Difficulty in walking, not elsewhere classified: Secondary | ICD-10-CM | POA: Diagnosis not present

## 2022-11-01 DIAGNOSIS — N281 Cyst of kidney, acquired: Secondary | ICD-10-CM | POA: Diagnosis not present

## 2022-11-01 DIAGNOSIS — M6281 Muscle weakness (generalized): Secondary | ICD-10-CM | POA: Diagnosis not present

## 2022-11-01 DIAGNOSIS — G40909 Epilepsy, unspecified, not intractable, without status epilepticus: Secondary | ICD-10-CM | POA: Diagnosis not present

## 2022-11-01 DIAGNOSIS — E119 Type 2 diabetes mellitus without complications: Secondary | ICD-10-CM | POA: Diagnosis not present

## 2022-11-01 DIAGNOSIS — K769 Liver disease, unspecified: Secondary | ICD-10-CM | POA: Diagnosis not present

## 2022-11-01 DIAGNOSIS — I10 Essential (primary) hypertension: Secondary | ICD-10-CM | POA: Diagnosis not present

## 2022-11-01 DIAGNOSIS — R161 Splenomegaly, not elsewhere classified: Secondary | ICD-10-CM | POA: Diagnosis not present

## 2022-11-01 DIAGNOSIS — R109 Unspecified abdominal pain: Secondary | ICD-10-CM | POA: Diagnosis present

## 2022-11-01 DIAGNOSIS — R1012 Left upper quadrant pain: Secondary | ICD-10-CM | POA: Diagnosis not present

## 2022-11-01 DIAGNOSIS — K7689 Other specified diseases of liver: Secondary | ICD-10-CM | POA: Diagnosis not present

## 2022-11-01 DIAGNOSIS — L304 Erythema intertrigo: Secondary | ICD-10-CM | POA: Diagnosis not present

## 2022-11-01 NOTE — Discharge Summary (Signed)
Physician Discharge Summary   Patient: Randy Cordova. MRN: [id number] DOB: 1970-12-28  Admit date:     10/21/2022  Discharge date: 11/01/22  Discharge Physician: Lurene Shadow   PCP: Eden Emms, NP   Recommendations at discharge:    Follow-up with your neurologist, Dr. Ramiro Harvest, and [facility] in 1 to 2 weeks Follow-up with physician at the nursing home within 3 days of discharge Follow-up with Dr. Gala Lewandowsky, podiatrist, in 3 weeks  Discharge Diagnoses: Principal Problem:   Inadequate pain control Active Problems:   Breakthrough seizures, recurrent (HCC)   OSA on CPAP   Epilepsy (HCC)   Morbid obesity with BMI of 60.0-69.9, adult (HCC)   Tobacco user   Primary hypertension   Osteoarthritis of both knees   Nonalcoholic fatty liver disease   Hyperlipidemia   History of traumatic brain injury   Generalized anxiety disorder   Arthralgia of hip   Foot pain, right  Resolved Problems:   * No resolved hospital problems. *  Hospital Course:  Labarron Cordova. is a 52 y.o. male  with history of TBI's, history of seizures secondary to TBI's, status post vagal nerve stimulator placement, morbid obesity, OSA on CPAP, hypertension, hyperlipidemia, presented to the hospital because of severe right foot pain and seizures.  He said he fell down the stairs after twisting his ankle as he was walking up the stairs.  He said he had a seizure shortly after the fall and this was witnessed.  He said he had about 11 seizures yesterday.  It was also reported that he had a brief seizure-like activity in the emergency department.   Patient admitted to the hospital for breakthrough seizures presumed secondary to decreased seizure threshold due to right foot pain   Assessment and Plan:    Breakthrough seizures in a patient with refractory epilepsy who has a VNS in place: Continue current antiepileptics.  Patient had been evaluated by the neurologist.  Follow-up with Dr. Ramiro Harvest, neurologist at [facility]  in 1 to 2 weeks     Right foot pain, right plantar fasciitis on x-ray of the ankle/foot: Continue analgesics as needed for pain.  Continue cam boot.  Nonweightbearing for 3 weeks as recommended by podiatrist.  No indication for surgery at this time.  Follow-up with Dr. Logan Bores, podiatrist, in 3 weeks     Constipation: Improved.  Laxatives as needed.     Morbid obesity BMI 62.85.  This complicates overall care and prognosis     Other comorbidities include osteoarthritis left lower extremities, hypertension, hyperlipidemia, tobacco use disorder, OSA on CPAP at night             Consultants: Neurologist, podiatrist  Procedures performed: None  Disposition: Skilled nursing facility Diet recommendation:  Discharge Diet Orders (From admission, onward)     Start     Ordered   10/31/22 0000  Diet general        10/31/22 1449           Regular diet DISCHARGE MEDICATION: Allergies as of 11/01/2022       Reactions   Aspartame And Phenylalanine    Headache        Medication List     STOP taking these medications    Nayzilam 5 MG/0.1ML Soln Generic drug: Midazolam       TAKE these medications    acetaminophen 325 MG tablet Commonly known as: TYLENOL Take 2 tablets (650 mg total) by mouth every 8 (eight) hours as needed. What changed:  how much to take when to take this reasons to take this   atorvastatin 20 MG tablet Commonly known as: LIPITOR TAKE 1 TABLET BY MOUTH DAILY   cloBAZam 20 MG tablet Commonly known as: ONFI Take 1 tablet by mouth at bedtime.   HYDROcodone-acetaminophen 5-325 MG tablet Commonly known as: NORCO/VICODIN Take 1 tablet by mouth every 8 (eight) hours as needed for moderate pain or severe pain.   hydrocortisone 2.5 % lotion Apply topically.   ketoconazole 2 % cream Commonly known as: NIZORAL Apply topically daily.   levETIRAcetam 750 MG tablet Commonly known as: KEPPRA Take 2 tablets (1,500 mg total) by mouth 2 (two)  times daily.   meloxicam 15 MG tablet Commonly known as: MOBIC Take 1 tablet (15 mg total) by mouth daily.   oxcarbazepine 600 MG tablet Commonly known as: TRILEPTAL Take 2 tablets by mouth in the morning and at bedtime.   polyethylene glycol 17 g packet Commonly known as: MIRALAX / GLYCOLAX Take 17 g by mouth daily as needed for mild constipation.   pyridoxine 200 MG tablet Commonly known as: B-6 Take 1 tablet (200 mg total) by mouth daily.   triamcinolone cream 0.1 % Commonly known as: KENALOG Apply topically.        Contact information for follow-up providers     [name], DPM Follow up.   Specialty: Podiatry Why: Make appt for 4 wks post d/c Contact information: [address] [place] [address] [phone]         Eden Emms, NP Follow up.   Specialties: Nurse Practitioner, Family Medicine Why: hospital f/u appt. Contact information: [address] [place] [address] [phone]              Contact information for after-discharge care     Destination     [facility] .   Service: Skilled Nursing Contact information: [address] [place] [address] [phone]                    Discharge Exam: Ceasar Mons Weights   10/21/22 1303  Weight: (!) 210.2 kg   GEN: NAD SKIN: Warm and dry EYES: No pallor or icterus ENT: MMM CV: RRR PULM: CTA B ABD: soft, obese, NT, +BS CNS: AAO x 3, non focal EXT: Cam boot on right leg/foot   Condition at discharge: good  The results of significant diagnostics from this hospitalization (including imaging, microbiology, ancillary and laboratory) are listed below for reference.   Imaging Studies: CT ANKLE RIGHT WO CONTRAST  Result Date: 10/22/2022 CLINICAL DATA:  Right ankle pain, twisting injury EXAM: CT OF THE RIGHT ANKLE WITHOUT CONTRAST TECHNIQUE: Multidetector CT imaging of the right ankle was performed according to the standard protocol.  Multiplanar CT image reconstructions were also generated. RADIATION DOSE REDUCTION: This exam was performed according to the departmental dose-optimization program which includes automated exposure control, adjustment of the mA and/or kV according to patient size and/or use of iterative reconstruction technique. COMPARISON:  X-ray 10/21/2022 FINDINGS: Bones/Joint/Cartilage Bones of the right ankle, hindfoot, and midfoot are intact. No acute fracture. No malalignment. Mild-to-moderate osteoarthritic changes with joint space narrowing and marginal osteophyte formation. Bidirectional calcaneal enthesophytes. Large ossification along the course of the peroneus longus tendon sheath at the level of the peroneal tubercle measuring 1.8 x 1.4 cm. Calcification within the proximal plantar fascia. No tibiotalar or subtalar joint effusion. Ligaments Suboptimally assessed by CT. Muscles and Tendons No acute musculotendinous abnormality by  CT. 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 No tenosynovial fluid collections. Soft tissues Soft tissue swelling most pronounced along the lateral aspect of the hindfoot and midfoot. No organized fluid collection or hematoma. IMPRESSION: 1. No acute fracture or malalignment of the right ankle. 2. Soft tissue swelling most pronounced along the lateral aspect of the hindfoot and midfoot. No organized fluid collection or hematoma. 3. Large ossification along the course of the right peroneus longus tendon sheath at the level of the peroneal tubercle measuring 1.8 x 1.4 cm. This is more proximally located than a typical os peroneum. 4. Mild-to-moderate osteoarthritic changes of the right foot and ankle. Electronically Signed   By: Duanne Guess D.O.   On: 10/22/2022 19:16   CT Head Wo Contrast  Result Date: 10/21/2022 CLINICAL DATA:  Larey Seat, seizure EXAM: CT HEAD WITHOUT CONTRAST TECHNIQUE: Contiguous axial images were obtained from the base of the skull  through the vertex without intravenous contrast. RADIATION DOSE REDUCTION: This exam was performed according to the departmental dose-optimization program which includes automated exposure control, adjustment of the mA and/or kV according to patient size and/or use of iterative reconstruction technique. COMPARISON:  12/04/2020 FINDINGS: Brain: No acute infarct or hemorrhage. Lateral ventricles and midline structures are unremarkable. No acute extra-axial fluid collections. No mass effect. Vascular: No hyperdense vessel or unexpected calcification. Skull: Normal. Negative for fracture or focal lesion. Sinuses/Orbits: No acute finding. Other: None. IMPRESSION: 1. Stable head CT, no acute intracranial process. Electronically Signed   By: Sharlet Salina M.D.   On: 10/21/2022 16:21   DG Foot Complete Right  Result Date: 10/21/2022 CLINICAL DATA:  Post fall, now with lateral sided foot and ankle pain. EXAM: RIGHT FOOT COMPLETE - 3+ VIEW COMPARISON:  Ankle radiographs earlier same day FINDINGS: There is diffuse soft tissue swelling about the imaged lower leg, ankle and hindfoot. Note is made of an os peroneus. No definite displaced acute fracture. Mild degenerative change of several midfoot articulations. Joint spaces appear otherwise well preserved. No significant hallux valgus deformity. No erosions. Pes cavus deformity is suspected on this nonweightbearing radiographs. Small plantar calcaneal spur. Dystrophic calcifications involving the posterior aspect of the plantar fascia. Enthesopathic change involving the Achilles tendon insertion site. Dermal calcifications overlie the anterior aspect of the lower leg. No radiopaque foreign body. IMPRESSION: 1. Diffuse soft tissue swelling about the imaged lower leg, ankle and hindfoot without underlying displaced acute fracture or dislocation. 2. Pes cavus deformity is suspected on this nonweightbearing radiographs. 3. Small plantar calcaneal spur with dystrophic  calcifications involving the posterior aspect of the plantar fascia, nonspecific though could be seen in the setting of plantar fasciitis. Electronically Signed   By: Simonne Come M.D.   On: 10/21/2022 15:06   DG Ankle Complete Right  Result Date: 10/21/2022 CLINICAL DATA:  Post fall, now with lateral sided foot and ankle pain. EXAM: RIGHT ANKLE - COMPLETE 3+ VIEW COMPARISON:  Right foot radiographs earlier same day FINDINGS: Mild soft tissue swelling about the imaged lower leg, ankle and hindfoot. Peripherally corticated ossicles adjacent to the medial malleolus likely represent the sequela of remote avulsive injury. Note is made of a os peroneus. No acute fracture or dislocation. Joint spaces appear preserved. The ankle mortise appears preserved. No ankle joint effusion. Small plantar calcaneal spur with dystrophic calcification overlying expected location of the posterior aspect of the plantar fascia. Enthesopathic change involving the Achilles tendon insertion site. Dermal calcifications overlie the anterior aspect of the  lower leg. IMPRESSION: 1. Mild soft tissue swelling about the imaged lower leg, ankle and hindfoot without associated acute fracture or dislocation. 2. Small plantar calcaneal spur with dystrophic calcification overlying expected location of the posterior aspect of the plantar fascia, nonspecific though could be seen in the setting of plantar fasciitis. Electronically Signed   By: Simonne Come M.D.   On: 10/21/2022 15:04   DG Foot Complete Right  Result Date: 10/03/2022 CLINICAL DATA:  Injury with pain to the dorsum near the distal first metatarsal-phalangeal EXAM: RIGHT FOOT COMPLETE - 3+ VIEW COMPARISON:  None Available. FINDINGS: Mild degenerative changes in the first metatarsal-phalangeal joint. Degenerative changes in the interphalangeal joints and intertarsal joints. Old ununited ossicle adjacent to the cuboidal bone. No acute fracture or dislocation. Calcification adjacent to the  plantar calcaneal spur suggesting plantar fasciitis. Small calcaneal Achilles spur. Calcifications in the soft tissues suggesting phleboliths. IMPRESSION: 1. Degenerative changes in the right foot including first metatarsal-phalangeal joint. 2. No acute displaced fractures identified. 3. Calcification adjacent to the plantar surface of the calcaneus suggesting plantar fasciitis. Electronically Signed   By: Burman Nieves M.D.   On: 10/03/2022 15:08    Microbiology: Results for orders placed or performed during the hospital encounter of 12/04/20  Resp Panel by RT-PCR (Flu A&B, Covid) Nasopharyngeal Swab     Status: None   Collection Time: 12/04/20  8:30 PM   Specimen: Nasopharyngeal Swab; Nasopharyngeal(NP) swabs in vial transport medium  Result Value Ref Range Status   SARS Coronavirus 2 by RT PCR NEGATIVE NEGATIVE Final    Comment: (NOTE) SARS-CoV-2 target nucleic acids are NOT DETECTED.  The SARS-CoV-2 RNA is generally detectable in upper respiratory specimens during the acute phase of infection. The lowest concentration of SARS-CoV-2 viral copies this assay can detect is 138 copies/mL. A negative result does not preclude SARS-Cov-2 infection and should not be used as the sole basis for treatment or other patient management decisions. A negative result may occur with  improper specimen collection/handling, submission of specimen other than nasopharyngeal swab, presence of viral mutation(s) within the areas targeted by this assay, and inadequate number of viral copies(<138 copies/mL). A negative result must be combined with clinical observations, patient history, and epidemiological information. The expected result is Negative.  Fact Sheet for Patients:  BloggerCourse.com  Fact Sheet for Healthcare Providers:  SeriousBroker.it  This test is no t yet approved or cleared by the [place] FDA and  has been authorized for detection  and/or diagnosis of SARS-CoV-2 by FDA under an Emergency Use Authorization (EUA). This EUA will remain  in effect (meaning this test can be used) for the duration of the COVID-19 declaration under Section 564(b)(1) of the Act, 21 U.S.C.section 360bbb-3(b)(1), unless the authorization is terminated  or revoked sooner.       Influenza A by PCR NEGATIVE NEGATIVE Final   Influenza B by PCR NEGATIVE NEGATIVE Final    Comment: (NOTE) The Xpert Xpress SARS-CoV-2/FLU/RSV plus assay is intended as an aid in the diagnosis of influenza from Nasopharyngeal swab specimens and should not be used as a sole basis for treatment. Nasal washings and aspirates are unacceptable for Xpert Xpress SARS-CoV-2/FLU/RSV testing.  Fact Sheet for Patients: BloggerCourse.com  Fact Sheet for Healthcare Providers: SeriousBroker.it  This test is not yet approved or cleared by the [place] FDA and has been authorized for detection and/or diagnosis of SARS-CoV-2 by FDA under an Emergency Use Authorization (EUA). This EUA will remain in effect (meaning this test can  be used) for the duration of the COVID-19 declaration under Section 564(b)(1) of the Act, 21 U.S.C. section 360bbb-3(b)(1), unless the authorization is terminated or revoked.  Performed at [facility], [address] [place], [place] [address]     Labs: CBC: Recent Labs  Lab 10/30/22 0713  WBC 3.0*  NEUTROABS 1.4*  HGB 10.8*  HCT 32.1*  MCV 87.7  PLT 84*   Basic Metabolic Panel: Recent Labs  Lab 10/27/22 0821 10/30/22 0713  NA 135 136  K 4.0 4.3  CL 104 106  CO2 24 25  GLUCOSE 95 92  BUN 26* 22*  CREATININE 0.85 0.83  CALCIUM 8.2* 8.1*   Liver Function Tests: No results for input(s): "AST", "ALT", "ALKPHOS", "BILITOT", "PROT", "ALBUMIN" in the last 168 hours. CBG: No results for input(s): "GLUCAP" in the last 168 hours.  Discharge time spent: less than 30  minutes.  Signed: Lurene Shadow, MD [facility] 11/01/2022

## 2022-11-01 NOTE — Care Management Important Message (Signed)
Important Message  Patient Details  Name: Randy Cordova. MRN: [id number] Date of Birth: Nov 27, 1970   Important Message Given:  Yes - Medicare IM     Verita Schneiders Jannae Fagerstrom 11/01/2022, 9:48 AM

## 2022-11-01 NOTE — TOC Transition Note (Addendum)
Transition of Care [facility]) - CM/SW Discharge Note   Patient Details  Name: Randy Cordova. MRN: [id number] Date of Birth: 1970/08/19  [facility]) CM/SW Contact:  Erin Sons, LCSW Phone Number: 11/01/2022, 12:16 PM   Clinical Narrative:     Cherlyn Roberts: [id number] E expire 12/01/2022  Per MD patient ready for DC to [facility]. RN, patient, and facility notified of DC. Discharge Summary and FL2 sent to facility. RN to call report prior to discharge [phone]). DC packet on chart. Pt's friend will be transporting him to SNF; they will arrive around 5pm.   CSW will sign off for now as social work intervention is no longer needed. Please consult [place] again if new needs arise.   Final next level of care: Skilled Nursing Facility Barriers to Discharge: No Barriers Identified   Patient Goals and CMS Choice CMS Medicare.gov Compare Post Acute Care list provided to:: Patient Choice offered to / list presented to : Patient  Discharge Placement                Patient chooses bed at:  ([facility]) Patient to be transferred to facility by: Friend   Patient and family notified of of transfer: 11/01/22  Discharge Plan and Services Additional resources added to the After Visit Summary for                                       Social Determinants of Health (SDOH) Interventions SDOH Screenings   Food Insecurity: Patient Declined (10/25/2022)  Recent Concern: Food Insecurity - Food Insecurity Present (10/02/2022)  Housing: Patient Declined (10/25/2022)  Transportation Needs: Patient Declined (10/25/2022)  Recent Concern: Transportation Needs - Unmet Transportation Needs (10/02/2022)  Utilities: Patient Declined (10/25/2022)  Alcohol Screen: Low Risk  (10/02/2022)  Depression (PHQ2-9): High Risk (10/16/2021)  Financial Resource Strain: Medium Risk (10/02/2022)  Physical Activity: Insufficiently Active (10/02/2022)  Social Connections: Moderately Isolated (10/02/2022)   Stress: Stress Concern Present (10/02/2022)  Tobacco Use: Medium Risk (10/21/2022)     Readmission Risk Interventions     No data to display

## 2022-11-01 NOTE — Plan of Care (Signed)
Report called to [facility].

## 2022-11-08 ENCOUNTER — Encounter (HOSPITAL_COMMUNITY): Payer: Self-pay

## 2022-11-08 ENCOUNTER — Emergency Department (HOSPITAL_COMMUNITY)
Admission: EM | Admit: 2022-11-08 | Discharge: 2022-11-08 | Disposition: A | Discharge status: Home / Self Care | Payer: Medicare Other | Attending: Emergency Medicine | Admitting: Emergency Medicine

## 2022-11-08 ENCOUNTER — Other Ambulatory Visit: Payer: Self-pay

## 2022-11-08 ENCOUNTER — Emergency Department (HOSPITAL_COMMUNITY): Payer: Medicare Other

## 2022-11-08 DIAGNOSIS — R1012 Left upper quadrant pain: Secondary | ICD-10-CM | POA: Insufficient documentation

## 2022-11-08 DIAGNOSIS — I10 Essential (primary) hypertension: Secondary | ICD-10-CM | POA: Insufficient documentation

## 2022-11-08 DIAGNOSIS — L304 Erythema intertrigo: Secondary | ICD-10-CM | POA: Insufficient documentation

## 2022-11-08 DIAGNOSIS — E119 Type 2 diabetes mellitus without complications: Secondary | ICD-10-CM | POA: Insufficient documentation

## 2022-11-08 DIAGNOSIS — R161 Splenomegaly, not elsewhere classified: Secondary | ICD-10-CM | POA: Diagnosis not present

## 2022-11-08 DIAGNOSIS — R109 Unspecified abdominal pain: Secondary | ICD-10-CM | POA: Diagnosis present

## 2022-11-08 DIAGNOSIS — N281 Cyst of kidney, acquired: Secondary | ICD-10-CM | POA: Diagnosis not present

## 2022-11-08 DIAGNOSIS — R110 Nausea: Secondary | ICD-10-CM | POA: Insufficient documentation

## 2022-11-08 DIAGNOSIS — K769 Liver disease, unspecified: Secondary | ICD-10-CM | POA: Diagnosis not present

## 2022-11-08 DIAGNOSIS — K7689 Other specified diseases of liver: Secondary | ICD-10-CM | POA: Diagnosis not present

## 2022-11-08 DIAGNOSIS — E039 Hypothyroidism, unspecified: Secondary | ICD-10-CM | POA: Diagnosis not present

## 2022-11-08 LAB — COMPREHENSIVE METABOLIC PANEL
ALT: 33 U/L (ref 0–44)
AST: 20 U/L (ref 15–41)
Albumin: 3.4 g/dL — ABNORMAL LOW (ref 3.5–5.0)
Alkaline Phosphatase: 83 U/L (ref 38–126)
Anion gap: 8 (ref 5–15)
BUN: 7 mg/dL (ref 6–20)
CO2: 23 mmol/L (ref 22–32)
Calcium: 8.4 mg/dL — ABNORMAL LOW (ref 8.9–10.3)
Chloride: 100 mmol/L (ref 98–111)
Creatinine, Ser: 0.72 mg/dL (ref 0.61–1.24)
GFR, Estimated: >60 mL/min (ref >60)
Glucose, Bld: 95 mg/dL (ref 70–99)
Potassium: 3.8 mmol/L (ref 3.5–5.1)
Sodium: 131 mmol/L — ABNORMAL LOW (ref 135–145)
Total Bilirubin: 0.7 mg/dL (ref 0.3–1.2)
Total Protein: 6.8 g/dL (ref 6.5–8.1)

## 2022-11-08 LAB — LIPASE, BLOOD: Lipase: 30 U/L (ref 11–51)

## 2022-11-08 LAB — CBC
HCT: 37.6 % — ABNORMAL LOW (ref 39.0–52.0)
Hemoglobin: 12.2 g/dL — ABNORMAL LOW (ref 13.0–17.0)
MCH: 30 pg (ref 26.0–34.0)
MCHC: 32.4 g/dL (ref 30.0–36.0)
MCV: 92.4 fL (ref 80.0–100.0)
Platelets: 92 10*3/uL — ABNORMAL LOW (ref 150–400)
RBC: 4.07 MIL/uL — ABNORMAL LOW (ref 4.22–5.81)
RDW: 14.1 % (ref 11.5–15.5)
WBC: 4.2 10*3/uL (ref 4.0–10.5)
nRBC: 0 % (ref 0.0–0.2)

## 2022-11-08 LAB — URINALYSIS, ROUTINE W REFLEX MICROSCOPIC
Bilirubin Urine: NEGATIVE
Glucose, UA: NEGATIVE mg/dL
Hgb urine dipstick: NEGATIVE
Ketones, ur: NEGATIVE mg/dL
Leukocytes,Ua: NEGATIVE
Nitrite: NEGATIVE
Protein, ur: NEGATIVE mg/dL
Specific Gravity, Urine: 1.009 (ref 1.005–1.030)
pH: 7 (ref 5.0–8.0)

## 2022-11-08 LAB — CBG MONITORING, ED: Glucose-Capillary: 92 mg/dL (ref 70–99)

## 2022-11-08 MED ORDER — IOHEXOL 350 MG/ML SOLN
75.0000 mL | Freq: Once | INTRAVENOUS | Status: AC | PRN
  Administered 2022-11-08: 75 mL via INTRAVENOUS

## 2022-11-08 MED ORDER — LEVETIRACETAM 500 MG PO TABS
1500.0000 mg | ORAL_TABLET | Freq: Once | ORAL | Status: AC
  Administered 2022-11-08: 1500 mg via ORAL
  Filled 2022-11-08: qty 3

## 2022-11-08 MED ORDER — OXCARBAZEPINE 300 MG PO TABS
1200.0000 mg | ORAL_TABLET | Freq: Once | ORAL | Status: AC
  Administered 2022-11-08: 1200 mg via ORAL
  Filled 2022-11-08: qty 4

## 2022-11-08 MED ORDER — VALACYCLOVIR HCL 1 G PO TABS: 1000.0000 mg | ORAL_TABLET | Freq: Three times a day (TID) | ORAL | 0 refills | Status: DC

## 2022-11-08 NOTE — ED Notes (Signed)
PTAR evaluated pt and found he does not qualify for ambulance transport. Randy Cordova stated their w/c [facility] is not in service. Consulting civil engineer notified. Will try to find different w/c van.

## 2022-11-08 NOTE — ED Triage Notes (Signed)
Patient BIB EMS from [facility], with c/o left upper ABD pain that started two days ago worsen on today. Dark urine. No vomiting. 144/80, 70, 98%ra, 18. A&Ox4.

## 2022-11-08 NOTE — Discharge Instructions (Addendum)
You have been seen today for your complaint of left upper quadrant abdominal pain. Your lab work was reassuring. Your imaging was reassuring and showed no acute abnormalities. Your discharge medications include Valtrex.  This is an antiviral medication.  I am sending this to treat possible early onset of shingles.  Take this medication as prescribed and for the entire duration of the prescription. Follow up with: Your PCP in 1 week for reevaluation of your symptoms Please seek immediate medical care if you develop any of the following symptoms: You cannot stop vomiting. Your pain is only in one part of the abdomen. Pain on the right side could be caused by appendicitis. You have bloody or black poop (stool), or poop that looks like tar. You have trouble breathing. You have chest pain. At this time there does not appear to be the presence of an emergent medical condition, however there is always the potential for conditions to change. Please read and follow the below instructions.  Do not take your medicine if  develop an itchy rash, swelling in your mouth or lips, or difficulty breathing; call 911 and seek immediate emergency medical attention if this occurs.  You may review your lab tests and imaging results in their entirety on your MyChart account.  Please discuss all results of fully with your primary care provider and other specialist at your follow-up visit.  Note: Portions of this text may have been transcribed using voice recognition software. Every effort was made to ensure accuracy; however, inadvertent computerized transcription errors may still be present.

## 2022-11-08 NOTE — ED Provider Notes (Signed)
[place] EMERGENCY DEPARTMENT AT [facility] Provider Note   CSN: [id number] Arrival date & time: 11/08/22  4098     History  Chief Complaint  Patient presents with   Abdominal Pain    Randy Cordova. is a 52 y.o. male.  With history of epilepsy, multiple TBI's, hypertension, hyperlipidemia, hypothyroidism, anxiety, type 2 diabetes, arthritis, thrombocytopenia presenting to the ED for evaluation of left-sided abdominal pain.  States he is currently in a rehab facility for his right foot.  He woke up with a sharp and stabbing pain in the left abdomen and was sent here by the rehab facility for further evaluation.  Pain is constant.  Does not radiate.  He reports mild associated nausea.  No vomiting, chest pain, shortness of breath or cough.  No dysuria or urgency.  Does report some frequency and some darker colored urine which is typical for when he is dehydrated.  He states he has been drinking more water over the past week but the urine is still darker than normal.  He denies any constipation or diarrhea.  Reports a history of cholecystectomy.   Abdominal Pain Associated symptoms: nausea        Home Medications Prior to Admission medications   Medication Sig Start Date End Date Taking? Authorizing Provider  valACYclovir (VALTREX) 1000 MG tablet Take 1 tablet (1,000 mg total) by mouth 3 (three) times daily. 11/08/22  Yes Sarahanne Novakowski, Edsel Petrin, PA-C  acetaminophen (TYLENOL) 325 MG tablet Take 2 tablets (650 mg total) by mouth every 8 (eight) hours as needed. 10/31/22   Lurene Shadow, MD  atorvastatin (LIPITOR) 20 MG tablet TAKE 1 TABLET BY MOUTH DAILY 09/11/22   Eden Emms, NP  cloBAZam (ONFI) 20 MG tablet Take 1 tablet by mouth at bedtime. 10/10/20   [provider]  HYDROcodone-acetaminophen (NORCO/VICODIN) 5-325 MG tablet Take 1 tablet by mouth every 8 (eight) hours as needed for moderate pain or severe pain. 10/31/22   Lurene Shadow, MD  hydrocortisone 2.5 %  lotion Apply topically. 10/21/22   [provider]  ketoconazole (NIZORAL) 2 % cream Apply topically daily. 12/21/21   Eden Emms, NP  levETIRAcetam (KEPPRA) 750 MG tablet Take 2 tablets (1,500 mg total) by mouth 2 (two) times daily. 04/23/20   Danford, Earl Lites, MD  meloxicam (MOBIC) 15 MG tablet Take 1 tablet (15 mg total) by mouth daily. 10/03/22   Eden Emms, NP  oxcarbazepine (TRILEPTAL) 600 MG tablet Take 2 tablets by mouth in the morning and at bedtime. 08/17/20 10/21/22  [provider]  polyethylene glycol (MIRALAX / GLYCOLAX) 17 g packet Take 17 g by mouth daily as needed for mild constipation. 10/31/22   Lurene Shadow, MD  pyridOXINE (B-6) 200 MG tablet Take 1 tablet (200 mg total) by mouth daily. 04/24/20   Danford, Earl Lites, MD  triamcinolone cream (KENALOG) 0.1 % Apply topically. 10/21/22   [provider]      Allergies    Aspartame and phenylalanine    Review of Systems   Review of Systems  Gastrointestinal:  Positive for abdominal pain and nausea.  All other systems reviewed and are negative.   Physical Exam Updated Vital Signs BP (!) 152/73   Pulse 76   Temp (!) 97.5 F (36.4 C) (Oral)   Resp (!) 22   SpO2 99%  Physical Exam Vitals and nursing note reviewed.  Constitutional:      General: He is not in acute distress.  Appearance: He is well-developed. He is obese.     Comments: Resting comfortably in bed  HENT:     Head: Normocephalic and atraumatic.  Eyes:     Conjunctiva/sclera: Conjunctivae normal.  Cardiovascular:     Rate and Rhythm: Normal rate and regular rhythm.     Heart sounds: No murmur heard. Pulmonary:     Effort: Pulmonary effort is normal. No respiratory distress.     Breath sounds: Normal breath sounds.  Abdominal:     General: Abdomen is protuberant.     Palpations: Abdomen is soft.     Tenderness: There is abdominal tenderness in the left upper quadrant. There is no right CVA tenderness, left CVA  tenderness or guarding.  Musculoskeletal:        General: No swelling.     Cervical back: Neck supple.  Skin:    General: Skin is warm and dry.     Capillary Refill: Capillary refill takes less than 2 seconds.     Comments: Mild intertrigo beneath the left breast  Neurological:     Mental Status: He is alert.  Psychiatric:        Mood and Affect: Mood normal.     ED Results / Procedures / Treatments   Labs (all labs ordered are listed, but only abnormal results are displayed) Labs Reviewed  COMPREHENSIVE METABOLIC PANEL - Abnormal; Notable for the following components:      Result Value   Sodium 131 (*)    Calcium 8.4 (*)    Albumin 3.4 (*)    All other components within normal limits  CBC - Abnormal; Notable for the following components:   RBC 4.07 (*)    Hemoglobin 12.2 (*)    HCT 37.6 (*)    Platelets 92 (*)    All other components within normal limits  LIPASE, BLOOD  URINALYSIS, ROUTINE W REFLEX MICROSCOPIC  CBG MONITORING, ED    EKG EKG Interpretation Date/Time:  Friday November 08 2022 08:44:05 EDT Ventricular Rate:  74 PR Interval:  159 QRS Duration:  97 QT Interval:  412 QTC Calculation: 458 R Axis:   55  Text Interpretation: Sinus rhythm Low voltage, precordial leads Confirmed by Vanetta Mulders [phone]) on 11/08/2022 8:46:39 AM  Radiology CT ABDOMEN PELVIS W CONTRAST  Result Date: 11/08/2022 CLINICAL DATA:  Left lower quadrant pain EXAM: CT ABDOMEN AND PELVIS WITH CONTRAST TECHNIQUE: Multidetector CT imaging of the abdomen and pelvis was performed using the standard protocol following bolus administration of intravenous contrast. RADIATION DOSE REDUCTION: This exam was performed according to the departmental dose-optimization program which includes automated exposure control, adjustment of the mA and/or kV according to patient size and/or use of iterative reconstruction technique. CONTRAST:  75mL OMNIPAQUE IOHEXOL 350 MG/ML SOLN COMPARISON:  None Available.  FINDINGS: Lower chest: There is some linear opacity lung bases likely scar or atelectasis. No pleural effusion. Hepatobiliary: Nodular liver. Homogeneous enhancement. Previous cholecystectomy. Patent portal vein Pancreas: Unremarkable. No pancreatic ductal dilatation or surrounding inflammatory changes. Spleen: Spleen is enlarged measuring 17.5 cm in cephalocaudal length. Adrenals/Urinary Tract: Parapelvic renal cysts are identified bilaterally. Otherwise no enhancing renal mass or collecting system dilatation. The ureters have normal course and caliber down to the normal caliber urinary bladder. The adrenal glands are preserved. Stomach/Bowel: On this non oral contrast exam large bowel has a normal course and caliber with scattered stool. Normal appendix. Stomach and small bowel are nondilated. Vascular/Lymphatic: Normal caliber aorta and IVC. Minimal vascular calcifications along the aorta. There are  some small upper abdominal lymph nodes less than 1 cm in short axis. Mild retroperitoneal stranding, nonspecific. Reproductive: Prostate is unremarkable. Other: Anasarca. Slight mesenteric haziness. No free air or fluid collections. Musculoskeletal: Moderate degenerative changes of the spine and pelvis. There are bridging osteophytes along the sacroiliac joints. Diffuse bridging syndesmophytes as well along the spine. IMPRESSION: Evidence of chronic liver disease with nodular liver, splenomegaly. No ascites, varices. Patent portal vein. Mild mesenteric haziness. No bowel obstruction.  Scattered stool.  Normal appendix. Electronically Signed   By: Karen Kays M.D.   On: 11/08/2022 11:10    Procedures Procedures    Medications Ordered in ED Medications  levETIRAcetam (KEPPRA) tablet 1,500 mg (has no administration in time range)  Oxcarbazepine (TRILEPTAL) tablet 1,200 mg (has no administration in time range)  iohexol (OMNIPAQUE) 350 MG/ML injection 75 mL (75 mLs Intravenous Contrast Given 11/08/22 1041)     ED Course/ Medical Decision Making/ A&P                                 Medical Decision Making Amount and/or Complexity of Data Reviewed Labs: ordered. Radiology: ordered.  Risk Prescription drug management.   This patient presents to the ED for concern of left upper quadrant abdominal pain, this involves an extensive number of treatment options, and is a complaint that carries with it a high risk of complications and morbidity.  The differential diagnosis includes The differential diagnosis for generalized abdominal pain includes, but is not limited to AAA, gastroenteritis, appendicitis, Bowel obstruction, Bowel perforation. Gastroparesis, DKA, Hernia, Inflammatory bowel disease, mesenteric ischemia, pancreatitis, peritonitis SBP, volvulus.   My initial workup includes labs, imaging, EKG.  Patient declined nausea or pain medication  Additional history obtained from: Nursing notes from this visit. Previous records within EMR system ED to hospital visit on 10/21/2022 EMS provides a portion of the history  I ordered, reviewed and interpreted labs which include: CBC, CMP, lipase, urinalysis  I ordered imaging studies including CT abdomen pelvis I independently visualized and interpreted imaging which showed no acute intra-abdominal abnormalities.  Scattered stool.  Mild mesenteric haziness. I agree with the radiologist interpretation  Cardiac Monitoring:  The patient was maintained on a cardiac monitor.  I personally viewed and interpreted the cardiac monitored which showed an underlying rhythm of: NSR  Afebrile, hemodynamically stable.  52 year old male presenting to the ED for evaluation of left upper quadrant abdominal pain.  This pain began fairly suddenly this morning.  He was sent here via EMS by his rehab facility for further evaluation.  On exam, he does appear to have intertrigo in the skin folds of the left breast.  No other rashes present.  There is some tenderness to  palpation initially, however he reports his symptoms had greatly improved while in the emergency department without intervention.  No fevers, chills or infectious symptoms.  Lab workup is reassuring.  Mild hyponatremia.  CT reassuring.  Overall question if he has early shingles as he was complaining of a sharp pain in a dermatomal distribution.  Will send prescription for Valtrex.  Overall low suspicion for acute intra-abdominal emergencies as the cause of his symptoms.  He states he already has a prescription cream for the intertrigo from his dermatologist.  He was encouraged to follow-up with his PCP in 1 week for reevaluation.  He was given return precautions.  Stable at discharge.  At this time there does not appear to be  any evidence of an acute emergency medical condition and the patient appears stable for discharge with appropriate outpatient follow up. Diagnosis was discussed with patient who verbalizes understanding of care plan and is agreeable to discharge. I have discussed return precautions with patient who verbalizes understanding. Patient encouraged to follow-up with their PCP within 1 week. All questions answered.  Note: Portions of this report may have been transcribed using voice recognition software. Every effort was made to ensure accuracy; however, inadvertent computerized transcription errors may still be present.        Final Clinical Impression(s) / ED Diagnoses Final diagnoses:  Left upper quadrant abdominal pain  Intertrigo    Rx / DC Orders ED Discharge Orders          Ordered    valACYclovir (VALTREX) 1000 MG tablet  3 times daily        11/08/22 1135              Michelle Piper, PA-C 11/08/22 1141    Vanetta Mulders, MD 11/12/22 [phone]

## 2022-11-15 ENCOUNTER — Encounter: Payer: Self-pay | Admitting: Nurse Practitioner

## 2022-11-18 ENCOUNTER — Ambulatory Visit (INDEPENDENT_AMBULATORY_CARE_PROVIDER_SITE_OTHER): Payer: Medicare Other | Admitting: Podiatry

## 2022-11-18 ENCOUNTER — Ambulatory Visit (INDEPENDENT_AMBULATORY_CARE_PROVIDER_SITE_OTHER): Payer: Medicare Other

## 2022-11-18 ENCOUNTER — Encounter: Payer: Self-pay | Admitting: Podiatry

## 2022-11-18 VITALS — HR 97 | Temp 98.0°F | Resp 20 | Ht 72.0 in | Wt >= 6400 oz

## 2022-11-18 DIAGNOSIS — M7671 Peroneal tendinitis, right leg: Secondary | ICD-10-CM | POA: Diagnosis not present

## 2022-11-18 DIAGNOSIS — M722 Plantar fascial fibromatosis: Secondary | ICD-10-CM | POA: Diagnosis not present

## 2022-11-18 DIAGNOSIS — R6889 Other general symptoms and signs: Secondary | ICD-10-CM | POA: Diagnosis not present

## 2022-11-18 NOTE — Progress Notes (Signed)
   Chief Complaint  Patient presents with   Plantar Fasciitis    RM9:Patient is here for right foot PF pain    HPI: 52 y.o. male presenting today for outpatient follow-up regarding a right foot injury.  Patient sustained a right foot injury which created onset of multiple seizures secondary to the pain with weightbearing.  DOI: 10/22/2022.  Patient was ultimately discharged to rehab.  Patient is now back to his friend's residence where he resides.  He says that he is able to ambulate without any pain or onset of seizures.  He says that he only wears the cam boot to go up the stairs but otherwise he has no pain associated to the foot.  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   Arthritis 2020   Epilepsy (HCC)    Generalized anxiety disorder 12/23/2020   Hyperlipidemia 12/23/2020   Hypothyroidism 10/29/2010   OSA on CPAP    Primary hypertension 12/23/2020   Sleep apnea 1997   On CPAP   TBI (traumatic brain injury) (HCC)    Thrombocytopenia (HCC)    Type 2 diabetes mellitus (HCC) 12/23/2020    Past Surgical History:  Procedure Laterality Date   BACK SURGERY     BRAIN SURGERY  02/27/2021   SEEG   CHOLECYSTECTOMY     FRACTURE SURGERY  Numerous   1985 - 2016   Head surgery     Due to brain injury   KNEE SURGERY     SPINE SURGERY  1997    Allergies  Allergen Reactions   Aspartame And Phenylalanine     Headache     Physical Exam: General: The patient is alert and oriented x3 in no acute distress.  Dermatology: Skin is warm, dry and supple bilateral lower extremities.   Vascular: Palpable pedal pulses bilaterally. Capillary refill within normal limits.  Chronic lymphedema noted.  No erythema.  Neurological: Grossly intact via light touch  Musculoskeletal Exam: No pedal deformities noted.  No tenderness throughout palpation.  Radiographic Exam RT foot 11/18/2022:  Unchanged since prior x-rays taken in the hospital on 10/21/2022.   IMPRESSION: 1. Diffuse soft tissue swelling about the imaged lower leg, ankle and hindfoot without underlying displaced acute fracture or dislocation. 2. Pes cavus deformity is suspected on this nonweightbearing radiographs. 3. Small plantar calcaneal spur with dystrophic calcifications involving the posterior aspect of the plantar fascia, nonspecific though could be seen in the setting of plantar fasciitis.  Assessment/Plan of Care: 1.  Peroneal tendinitis right; resolved  -Patient evaluated.  X-rays reviewed -Patient may discontinue the cam boot. -WBAT good supportive tennis shoes and sneakers.  Patient uses a walker for assistance -Patient may resume full activity no restrictions -Return to clinic as needed       [name], DPM [facility]  Dr. [name], DPM    [address] [place], [place] [address]                Office [phone]  Fax [phone]

## 2022-11-28 ENCOUNTER — Encounter: Payer: Self-pay | Admitting: Nurse Practitioner

## 2022-11-28 NOTE — Telephone Encounter (Signed)
error 

## 2022-12-19 ENCOUNTER — Encounter: Payer: Medicare Other | Admitting: Nurse Practitioner

## 2022-12-19 DIAGNOSIS — Z9689 Presence of other specified functional implants: Secondary | ICD-10-CM | POA: Diagnosis not present

## 2022-12-19 DIAGNOSIS — Z6841 Body Mass Index (BMI) 40.0 and over, adult: Secondary | ICD-10-CM | POA: Diagnosis not present

## 2022-12-19 DIAGNOSIS — G40011 Localization-related (focal) (partial) idiopathic epilepsy and epileptic syndromes with seizures of localized onset, intractable, with status epilepticus: Secondary | ICD-10-CM | POA: Diagnosis not present

## 2022-12-19 DIAGNOSIS — Z79899 Other long term (current) drug therapy: Secondary | ICD-10-CM | POA: Diagnosis not present

## 2022-12-19 DIAGNOSIS — G40419 Other generalized epilepsy and epileptic syndromes, intractable, without status epilepticus: Secondary | ICD-10-CM | POA: Diagnosis not present

## 2022-12-19 DIAGNOSIS — Z9682 Presence of neurostimulator: Secondary | ICD-10-CM | POA: Diagnosis not present

## 2022-12-23 ENCOUNTER — Ambulatory Visit (INDEPENDENT_AMBULATORY_CARE_PROVIDER_SITE_OTHER): Payer: Medicare Other | Admitting: Nurse Practitioner

## 2022-12-23 ENCOUNTER — Encounter: Payer: Self-pay | Admitting: Nurse Practitioner

## 2022-12-23 VITALS — BP 132/88 | HR 78 | Temp 98.1°F | Ht 70.5 in | Wt >= 6400 oz

## 2022-12-23 DIAGNOSIS — G4733 Obstructive sleep apnea (adult) (pediatric): Secondary | ICD-10-CM

## 2022-12-23 DIAGNOSIS — I10 Essential (primary) hypertension: Secondary | ICD-10-CM | POA: Diagnosis not present

## 2022-12-23 DIAGNOSIS — Z0000 Encounter for general adult medical examination without abnormal findings: Secondary | ICD-10-CM | POA: Diagnosis not present

## 2022-12-23 DIAGNOSIS — Z23 Encounter for immunization: Secondary | ICD-10-CM | POA: Diagnosis not present

## 2022-12-23 DIAGNOSIS — R6889 Other general symptoms and signs: Secondary | ICD-10-CM | POA: Diagnosis not present

## 2022-12-23 DIAGNOSIS — K760 Fatty (change of) liver, not elsewhere classified: Secondary | ICD-10-CM

## 2022-12-23 DIAGNOSIS — E039 Hypothyroidism, unspecified: Secondary | ICD-10-CM | POA: Diagnosis not present

## 2022-12-23 DIAGNOSIS — Z87820 Personal history of traumatic brain injury: Secondary | ICD-10-CM

## 2022-12-23 DIAGNOSIS — Z87898 Personal history of other specified conditions: Secondary | ICD-10-CM

## 2022-12-23 LAB — COMPREHENSIVE METABOLIC PANEL
ALT: 21 U/L (ref 0–53)
AST: 16 U/L (ref 0–37)
Albumin: 4.1 g/dL (ref 3.5–5.2)
Alkaline Phosphatase: 105 U/L (ref 39–117)
BUN: 14 mg/dL (ref 6–23)
CO2: 23 meq/L (ref 19–32)
Calcium: 8.8 mg/dL (ref 8.4–10.5)
Chloride: 104 meq/L (ref 96–112)
Creatinine, Ser: 0.82 mg/dL (ref 0.40–1.50)
GFR: 101.51 mL/min (ref >60.00)
Glucose, Bld: 93 mg/dL (ref 70–99)
Potassium: 4.4 meq/L (ref 3.5–5.1)
Sodium: 135 meq/L (ref 135–145)
Total Bilirubin: 0.4 mg/dL (ref 0.2–1.2)
Total Protein: 7.3 g/dL (ref 6.0–8.3)

## 2022-12-23 LAB — CBC
HCT: 42 % (ref 39.0–52.0)
Hemoglobin: 14.1 g/dL (ref 13.0–17.0)
MCHC: 33.5 g/dL (ref 30.0–36.0)
MCV: 89.3 fL (ref 78.0–100.0)
Platelets: 90 10*3/uL — ABNORMAL LOW (ref 150.0–400.0)
RBC: 4.7 Mil/uL (ref 4.22–5.81)
RDW: 15 % (ref 11.5–15.5)
WBC: 4.6 10*3/uL (ref 4.0–10.5)

## 2022-12-23 NOTE — Progress Notes (Signed)
Established Patient Office Visit  Subjective   Patient ID: Gail Botsch., male    DOB: May 11, 1970  Age: 52 y.o. MRN: [id number]  Chief Complaint  Patient presents with   Annual Exam    HPI  HTN: currenlty controlled with lifestyle modifications  OSA: On CPAP and does well. Dr. Ramiro Harvest ordered a sleep study  Cirrhoisis: Patient was followed by liver specialist not currently being followed.  Hypothyroidism: Patient's previous TSH is within normal limits currently on any medications  Epilepsy: Dr. Ramiro Harvest every 6 months on onfi, trilepital, keppra   for complete physical and follow up of chronic conditions.  Immunizations: -Tetanus: Completed with in 10 years  -Influenza: update today  -Shingles: Completed Shingrix series -Pneumonia: too young   Diet: Fair diet. He is eating 2 meals and some snacks. Likes the llittle debbie oatmeal. Watera dn tea  Exercise: No regular exercise. Been doing resistance band and doing body weight exercises   Eye exam:  needs updating. Waering glasses Dental exam: Completes semi-annually    Colonoscopy: Completed in 2018 with 2 polyps  and was seen by [facility] and for liver.  Lung Cancer Screening: NA    PSA: UTD       Review of Systems  Constitutional:  Negative for chills and fever.  Respiratory:  Negative for shortness of breath.   Cardiovascular:  Negative for chest pain and leg swelling.  Gastrointestinal:  Negative for abdominal pain, blood in stool, constipation, diarrhea, nausea and vomiting.  Genitourinary:  Negative for dysuria and hematuria.  Neurological:  Negative for tingling and headaches.  Psychiatric/Behavioral:  Negative for hallucinations and suicidal ideas.       Objective:     BP 132/88   Pulse 78   Temp 98.1 F (36.7 C) (Oral)   Ht 5' 10.5" (1.791 m)   Wt (!) 451 lb 6.4 oz (204.8 kg)   SpO2 96%   BMI 63.85 kg/m  BP Readings from Last 3 Encounters:  12/23/22 132/88  11/08/22 (!) 144/76  11/01/22  (!) 146/58   Wt Readings from Last 3 Encounters:  12/23/22 (!) 451 lb 6.4 oz (204.8 kg)  11/18/22 (!) 463 lb (210 kg)  10/21/22 (!) 463 lb 6.5 oz (210.2 kg)      Physical Exam Vitals and nursing note reviewed.  Constitutional:      Appearance: Normal appearance.  HENT:     Right Ear: Tympanic membrane, ear canal and external ear normal.     Left Ear: Tympanic membrane, ear canal and external ear normal.     Mouth/Throat:     Mouth: Mucous membranes are moist.     Pharynx: Oropharynx is clear.  Eyes:     Extraocular Movements: Extraocular movements intact.     Pupils: Pupils are equal, round, and reactive to light.  Cardiovascular:     Rate and Rhythm: Normal rate and regular rhythm.     Heart sounds: Normal heart sounds.  Pulmonary:     Effort: Pulmonary effort is normal.     Breath sounds: Normal breath sounds.  Abdominal:     General: Bowel sounds are normal.  Musculoskeletal:     Right lower leg: Edema present.     Left lower leg: Edema present.  Lymphadenopathy:     Cervical: No cervical adenopathy.  Neurological:     General: No focal deficit present.     Mental Status: He is alert.      No results found for any visits on 12/23/22.  The 10-year ASCVD risk score (Arnett DK, et al., 2019) is: 5.6%    Assessment & Plan:   Problem List Items Addressed This Visit       Cardiovascular and Mediastinum   Primary hypertension    Patient currently maintained on lifestyle modifications only.  Blood pressure under normal limits continue      Relevant Orders   CBC   Comprehensive metabolic panel     Respiratory   OSA on CPAP    History of the same pending sleep study per neurology.  Continues a CPAP as recommended        Digestive   Nonalcoholic fatty liver disease    History of the same patient no longer follows a liver specialist will check liver functions and platelets today        Endocrine   Hypothyroidism    Historical diagnosis TSH within  normal limits not currently on any medications        Nervous and Auditory   History of traumatic brain injury    Followed by neurology        Other   Preventative health care - Primary    Discussed age-appropriate immunizations and screening exams.  Did review patient's personal, surgical, social, family histories.  Patient is up-to-date on all age-appropriate vaccinations he would like.  Update flu vaccine today.  Per patient report he is up-to-date on CRC screening we did PSA earlier this year that was normal.  Patient was given information at discharge about preventative healthcare maintenance with anticipatory guidance.      Relevant Orders   CBC   Comprehensive metabolic panel   History of seizures    Patient has a VNS is currently on Onfi, Keppra, Trileptal.  Continue taking medication as prescribed continue following neurology as recommended      Other Visit Diagnoses     Need for influenza vaccination       Relevant Orders   Flu vaccine trivalent PF, 6mos and older(Flulaval,Afluria,Fluarix,Fluzone) (Completed)       Return in about 6 months (around 06/22/2023) for Sizeure/platelet/weight recheck .    Audria Nine, NP

## 2022-12-23 NOTE — Assessment & Plan Note (Signed)
Patient currently maintained on lifestyle modifications only.  Blood pressure under normal limits continue

## 2022-12-23 NOTE — Assessment & Plan Note (Signed)
Patient has a VNS is currently on Onfi, Keppra, Trileptal.  Continue taking medication as prescribed continue following neurology as recommended

## 2022-12-23 NOTE — Patient Instructions (Signed)
I will be in touch with the labs once I have them Follow up in 6 months We updated your flu vaccine today

## 2022-12-23 NOTE — Assessment & Plan Note (Signed)
Historical diagnosis TSH within normal limits not currently on any medications

## 2022-12-23 NOTE — Assessment & Plan Note (Signed)
History of the same patient no longer follows a liver specialist will check liver functions and platelets today

## 2022-12-23 NOTE — Assessment & Plan Note (Signed)
Followed by neurology.   

## 2022-12-23 NOTE — Assessment & Plan Note (Signed)
Discussed age-appropriate immunizations and screening exams.  Did review patient's personal, surgical, social, family histories.  Patient is up-to-date on all age-appropriate vaccinations he would like.  Update flu vaccine today.  Per patient report he is up-to-date on CRC screening we did PSA earlier this year that was normal.  Patient was given information at discharge about preventative healthcare maintenance with anticipatory guidance.

## 2022-12-23 NOTE — Assessment & Plan Note (Signed)
History of the same pending sleep study per neurology.  Continues a CPAP as recommended

## 2022-12-25 DIAGNOSIS — Z4549 Encounter for adjustment and management of other implanted nervous system device: Secondary | ICD-10-CM | POA: Diagnosis not present

## 2022-12-25 DIAGNOSIS — G40011 Localization-related (focal) (partial) idiopathic epilepsy and epileptic syndromes with seizures of localized onset, intractable, with status epilepticus: Secondary | ICD-10-CM | POA: Diagnosis not present

## 2022-12-25 DIAGNOSIS — S069XAA Unspecified intracranial injury with loss of consciousness status unknown, initial encounter: Secondary | ICD-10-CM | POA: Diagnosis not present

## 2022-12-25 DIAGNOSIS — Z79899 Other long term (current) drug therapy: Secondary | ICD-10-CM | POA: Diagnosis not present

## 2022-12-25 DIAGNOSIS — R6889 Other general symptoms and signs: Secondary | ICD-10-CM | POA: Diagnosis not present

## 2022-12-25 DIAGNOSIS — Z9689 Presence of other specified functional implants: Secondary | ICD-10-CM | POA: Diagnosis not present

## 2023-03-24 ENCOUNTER — Other Ambulatory Visit: Payer: Self-pay | Admitting: Nurse Practitioner

## 2023-03-27 ENCOUNTER — Other Ambulatory Visit: Payer: Self-pay | Admitting: Nurse Practitioner

## 2023-03-27 DIAGNOSIS — M170 Bilateral primary osteoarthritis of knee: Secondary | ICD-10-CM

## 2023-03-28 NOTE — Telephone Encounter (Signed)
  LAST APPOINTMENT DATE: 12/13/22   NEXT APPOINTMENT DATE: 06/22/23 6 mo f/u     LAST REFILL:12/13/22  QTY: 90

## 2023-05-23 ENCOUNTER — Ambulatory Visit: Payer: Medicare Other

## 2023-05-26 ENCOUNTER — Encounter: Payer: Self-pay | Admitting: Nurse Practitioner

## 2023-05-26 ENCOUNTER — Ambulatory Visit (INDEPENDENT_AMBULATORY_CARE_PROVIDER_SITE_OTHER): Payer: Medicare Other

## 2023-05-26 ENCOUNTER — Telehealth: Payer: Self-pay

## 2023-05-26 ENCOUNTER — Ambulatory Visit: Payer: Medicare Other | Admitting: Nurse Practitioner

## 2023-05-26 VITALS — BP 128/82 | Ht 70.0 in | Wt >= 6400 oz

## 2023-05-26 VITALS — BP 132/82 | HR 88 | Temp 98.1°F | Ht 70.5 in | Wt >= 6400 oz

## 2023-05-26 DIAGNOSIS — Z0000 Encounter for general adult medical examination without abnormal findings: Secondary | ICD-10-CM | POA: Diagnosis not present

## 2023-05-26 DIAGNOSIS — Z2821 Immunization not carried out because of patient refusal: Secondary | ICD-10-CM | POA: Diagnosis not present

## 2023-05-26 DIAGNOSIS — Z87898 Personal history of other specified conditions: Secondary | ICD-10-CM

## 2023-05-26 DIAGNOSIS — I10 Essential (primary) hypertension: Secondary | ICD-10-CM | POA: Diagnosis not present

## 2023-05-26 DIAGNOSIS — R32 Unspecified urinary incontinence: Secondary | ICD-10-CM | POA: Diagnosis not present

## 2023-05-26 DIAGNOSIS — F320 Major depressive disorder, single episode, mild: Secondary | ICD-10-CM

## 2023-05-26 DIAGNOSIS — K760 Fatty (change of) liver, not elsewhere classified: Secondary | ICD-10-CM

## 2023-05-26 DIAGNOSIS — Z5320 Procedure and treatment not carried out because of patient's decision for unspecified reasons: Secondary | ICD-10-CM

## 2023-05-26 LAB — POCT URINALYSIS DIPSTICK
Bilirubin, UA: NEGATIVE
Blood, UA: NEGATIVE
Glucose, UA: NEGATIVE
Ketones, UA: NEGATIVE
Leukocytes, UA: NEGATIVE
Nitrite, UA: NEGATIVE
Protein, UA: POSITIVE — AB
Spec Grav, UA: 1.025 (ref 1.010–1.025)
Urobilinogen, UA: 0.2 U/dL
pH, UA: 5.5 (ref 5.0–8.0)

## 2023-05-26 MED ORDER — WEGOVY 0.25 MG/0.5ML ~~LOC~~ SOAJ
0.2500 mg | SUBCUTANEOUS | 0 refills | Status: DC
Start: 2023-05-26 — End: 2023-06-18

## 2023-05-26 NOTE — Assessment & Plan Note (Signed)
 Patient currently on Onfi , Trileptal , Keppra .  Is followed by neurology.  Continue taking medication as prescribed and follow-up with neurology as recommended

## 2023-05-26 NOTE — Patient Instructions (Signed)
 Nice to see you today If you start the injection follow up with me in 3 months, sooner if you need me I will be in touch with the labs once  I have them

## 2023-05-26 NOTE — Progress Notes (Signed)
 Established Patient Office Visit  Subjective   Patient ID: Randy Brandel., male    DOB: 1970/06/25  Age: 53 y.o. MRN: [id number]  Chief Complaint  Patient presents with   Medical Management of Chronic Issues    HPI  HTN: Patient is currently maintained on lifestyle modifications only.  Not on any antiepileptic  Seizures: currenlty on onfi , trilipteal, and keppra . He is followed by Dr. Lorriane Rote 06/04/2023.  Patient has a VNS  He is concerned because he has been experiencing some urinary incontinence as of late.  This is outside of active seizure activity.  Patient states that it is just a few drops but he is curious if it is related to neurological given his seizure history of Bell's palsy.  NAFL: History of the same.,  Thrombocytopenia  secondary to that.  Will monitor LFTs along with platelet count..  Patient has lost some weight since last office visit.  He does not have a history of medullary thyroid  cancer or men type II.  Mood: Patient states given his comorbidities and the shape of his body he does have depression quite often.  States that he has done therapy in the past and has not been beneficial.  Does not like therapy beneficial currently.  Did offer to medicate patient he politely declined at this juncture stating this is another pill to take.    Review of Systems  Constitutional:  Negative for chills and fever.  Respiratory:  Negative for shortness of breath.   Cardiovascular:  Negative for chest pain.  Musculoskeletal:  Positive for joint pain.  Neurological:  Positive for seizures. Negative for headaches.  Psychiatric/Behavioral:  Positive for memory loss. Negative for hallucinations and suicidal ideas.       Objective:     BP 132/82   Pulse 88   Temp 98.1 F (36.7 C) (Oral)   Ht 5' 10.5" (1.791 m)   Wt (!) 457 lb (207.3 kg)   SpO2 95%   BMI 64.65 kg/m  BP Readings from Last 3 Encounters:  05/26/23 132/82  05/26/23 128/82  12/23/22 132/88   Wt  Readings from Last 3 Encounters:  05/26/23 (!) 457 lb (207.3 kg)  05/26/23 (!) 457 lb (207.3 kg)  12/23/22 (!) 451 lb 6.4 oz (204.8 kg)   SpO2 Readings from Last 3 Encounters:  05/26/23 95%  12/23/22 96%  11/18/22 95%      Physical Exam Vitals and nursing note reviewed.  Constitutional:      Appearance: Normal appearance. He is obese.  Eyes:     General:        Left eye: Discharge (clear) present. Cardiovascular:     Rate and Rhythm: Normal rate and regular rhythm.     Heart sounds: Normal heart sounds.  Pulmonary:     Effort: Pulmonary effort is normal.     Breath sounds: Normal breath sounds.  Neurological:     Mental Status: He is alert.      Results for orders placed or performed in visit on 05/26/23  POCT urinalysis dipstick  Result Value Ref Range   Color, UA dark orangeish color    Clarity, UA clear    Glucose, UA Negative Negative   Bilirubin, UA negative    Ketones, UA negative    Spec Grav, UA 1.025 1.010 - 1.025   Blood, UA negative    pH, UA 5.5 5.0 - 8.0   Protein, UA Positive (A) Negative   Urobilinogen, UA 0.2 0.2 or 1.0 E.U./dL  Nitrite, UA negative    Leukocytes, UA Negative Negative   Appearance     Odor        The 10-year ASCVD risk score (Arnett DK, et al., 2019) is: 5.9%    Assessment & Plan:   Problem List Items Addressed This Visit       Cardiovascular and Mediastinum   Primary hypertension - Primary   Patient has gained a couple pounds since last office visit but blood pressure still within normal limits and controlled/maintained on lifestyle modifications only.      Relevant Orders   CBC   Comprehensive metabolic panel with GFR     Digestive   Nonalcoholic fatty liver disease   History of the same.  Will see if patient qualifies for Wegovy  for nonalcoholic fatty liver disease.  Patient denies having a personal or family history of medullary thyroid  cancer or multiple endocrine neoplasia syndrome type II.      Relevant  Medications   Semaglutide -Weight Management (WEGOVY ) 0.25 MG/0.5ML SOAJ     Other   Morbid obesity (HCC)   History of the same.  Patient has gained weight we will see if he qualifies for Wegovy .      Relevant Medications   Semaglutide -Weight Management (WEGOVY ) 0.25 MG/0.5ML SOAJ   History of seizure   Patient currently on Onfi , Trileptal , Keppra .  Is followed by neurology.  Continue taking medication as prescribed and follow-up with neurology as recommended      Urinary incontinence   Relevant Orders   POCT urinalysis dipstick (Completed)   Urine Culture    Return in about 3 months (around 08/25/2023) for wegovy /weight.    Margarie Shay, NP

## 2023-05-26 NOTE — Telephone Encounter (Signed)
 Pharmacy Patient Advocate Encounter   Received notification from CoverMyMeds that prior authorization for Wegovy  0.25MG /0.5ML auto-injectors  is required/requested.   Insurance verification completed.   The patient is insured through [employer] .   Per test claim: PA required; PA submitted to above mentioned insurance via CoverMyMeds Key/confirmation #/EOC [id number] Status is pending

## 2023-05-26 NOTE — Assessment & Plan Note (Signed)
 History of the same.  Patient has gained weight we will see if he qualifies for Wegovy .

## 2023-05-26 NOTE — Progress Notes (Signed)
 Because this visit was a virtual/telehealth visit,  certain criteria was not obtained, such a blood pressure, CBG if applicable, and timed get up and go. Any medications not marked as "taking" were not mentioned during the medication reconciliation part of the visit. Any vitals not documented were not able to be obtained due to this being a telehealth visit or patient was unable to self-report a recent blood pressure reading due to a lack of equipment at home via telehealth. Vitals that have been documented are verbally provided by the patient.  Subjective:   Randy Cordova. is a 53 y.o. who presents for a Medicare Wellness preventive visit.  Visit Complete: Virtual I connected with  Randy Cordova. on 05/26/23 by a audio enabled telemedicine application and verified that I am speaking with the correct person using two identifiers.  Patient Location: Home  Provider Location: Home Office  I discussed the limitations of evaluation and management by telemedicine. The patient expressed understanding and agreed to proceed.  Vital Signs: Because this visit was a virtual/telehealth visit, some criteria may be missing or patient reported. Any vitals not documented were not able to be obtained and vitals that have been documented are patient reported.  VideoDeclined- This patient declined Librarian, academic. Therefore the visit was completed with audio only.  Persons Participating in Visit: Patient.  AWV Questionnaire: No: Patient Medicare AWV questionnaire was not completed prior to this visit.  Cardiac Risk Factors include: advanced age (>14men, >71 women);male gender;dyslipidemia;obesity (BMI >30kg/m2)     Objective:    Today's Vitals   05/26/23 1305  BP: 128/82  Weight: (!) 457 lb (207.3 kg)  Height: 5\' 10"  (1.778 m)   Body mass index is 65.57 kg/m.     05/26/2023    1:11 PM 11/08/2022   12:26 PM 10/21/2022    1:16 PM 12/04/2020    6:56 PM 04/18/2020     4:00 PM 04/17/2020    6:04 PM 03/19/2020   10:26 AM  Advanced Directives  Does Patient Have a Medical Advance Directive? No No No No No No Yes  Type of Advance Directive       Living will  Does patient want to make changes to medical advance directive?       No - Patient declined  Would patient like information on creating a medical advance directive? No - Patient declined  No - Patient declined  No - Patient declined  No - Patient declined    Current Medications (verified) Outpatient Encounter Medications as of 05/26/2023  Medication Sig   acetaminophen  (TYLENOL ) 325 MG tablet Take 2 tablets (650 mg total) by mouth every 8 (eight) hours as needed.   atorvastatin  (LIPITOR) 20 MG tablet TAKE 1 TABLET BY MOUTH DAILY   cloBAZam  (ONFI ) 20 MG tablet Take 1 tablet by mouth at bedtime.   hydrocortisone 2.5 % lotion Apply topically.   ketoconazole  (NIZORAL ) 2 % cream Apply topically daily.   levETIRAcetam  (KEPPRA ) 750 MG tablet Take 2 tablets (1,500 mg total) by mouth 2 (two) times daily.   meloxicam  (MOBIC ) 15 MG tablet TAKE 1 TABLET BY MOUTH DAILY   nystatin cream (MYCOSTATIN) Apply topically.   pyridOXINE  (B-6) 200 MG tablet Take 1 tablet (200 mg total) by mouth daily.   triamcinolone  cream (KENALOG ) 0.1 % Apply topically.   oxcarbazepine  (TRILEPTAL ) 600 MG tablet Take 2 tablets by mouth in the morning and at bedtime.   No facility-administered encounter medications on file as of 05/26/2023.  Allergies (verified) Aspartame and phenylalanine   History: 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   Arthritis 2020   Epilepsy (HCC)    Generalized anxiety disorder 12/23/2020   Hyperlipidemia 12/23/2020   Hypothyroidism 10/29/2010   OSA on CPAP    Primary hypertension 12/23/2020   Sleep apnea 1997   On CPAP   TBI (traumatic brain injury) (HCC)    Thrombocytopenia (HCC)    Type 2 diabetes mellitus (HCC) 12/23/2020   Past Surgical History:  Procedure  Laterality Date   BACK SURGERY     BRAIN SURGERY  02/27/2021   SEEG   CHOLECYSTECTOMY     FRACTURE SURGERY  Numerous   1985 - 2016   Head surgery     Due to brain injury   KNEE SURGERY     SPINE SURGERY  1997   Family History  Problem Relation Age of Onset   Arthritis/Rheumatoid Mother    Arthritis Mother    Obesity Mother    Cancer Maternal Grandmother        lung cancer   Obesity Maternal Aunt    Alcohol abuse Maternal Uncle    Renal cancer Maternal Uncle    Cancer Maternal Uncle        lung, colon cancer   Early death Maternal Uncle    Social History   Socioeconomic History   Marital status: Married    Spouse name: [name]   Number of children: Not on file   Years of education: Not on file   Highest education level: GED or equivalent  Occupational History   Not on file  Tobacco Use   Smoking status: Former    Current packs/day: 0.00    Average packs/day: 1 pack/day for 35.0 years (35.0 ttl pk-yrs)    Types: Cigarettes, Cigars, E-cigarettes    Start date: 03/09/1984    Quit date: 03/10/2019    Years since quitting: 4.2   Smokeless tobacco: Never   Tobacco comments:    Quit e cig in 2021 or 2022    Real cig 2018  Vaping Use   Vaping status: Former  Substance and Sexual Activity   Alcohol use: Not Currently    Comment: not since two years   Drug use: Never   Sexual activity: Not Currently    Birth control/protection: Abstinence  Other Topics Concern   Not on file  Social History Narrative   Not on file   Social Drivers of Health   Financial Resource Strain: Low Risk  (05/26/2023)   Overall Financial Resource Strain (CARDIA)    Difficulty of Paying Living Expenses: Not very hard  Recent Concern: Financial Resource Strain - Medium Risk (05/22/2023)   Overall Financial Resource Strain (CARDIA)    Difficulty of Paying Living Expenses: Somewhat hard  Food Insecurity: Food Insecurity Present (05/22/2023)   Hunger Vital Sign    Worried About Running  Out of Food in the Last Year: Sometimes true    Ran Out of Food in the Last Year: Sometimes true  Transportation Needs: Unmet Transportation Needs (05/22/2023)   PRAPARE - Transportation    Lack of Transportation (Medical): Yes    Lack of Transportation (Non-Medical): Yes  Physical Activity: Insufficiently Active (05/22/2023)   Exercise Vital Sign    Days of Exercise per Week: 3 days    Minutes of Exercise per Session: 30 min  Stress: Stress Concern Present (05/22/2023)   [employer] of Occupational Health - Occupational Stress Questionnaire  Feeling of Stress : Very much  Social Connections: Moderately Isolated (05/22/2023)   Social Connection and Isolation Panel [NHANES]    Frequency of Communication with Friends and Family: More than three times a week    Frequency of Social Gatherings with Friends and Family: More than three times a week    Attends Religious Services: Never    Database administrator or Organizations: No    Attends Engineer, structural: Not on file    Marital Status: Married    Tobacco Counseling Counseling given: Not Answered Tobacco comments: Quit e cig in 2021 or 2022 Real cig 2018    Clinical Intake:  Pre-visit preparation completed: Yes  Pain : No/denies pain     BMI - recorded: 65.57 Nutritional Status: BMI > 30  Obese Nutritional Risks: None Diabetes: No  Lab Results  Component Value Date   HGBA1C 5.3 10/03/2022   HGBA1C 5.8 (H) 12/21/2021   HGBA1C 5.4 12/05/2020     How often do you need to have someone help you when you read instructions, pamphlets, or other written materials from your doctor or pharmacy?: 1 - Never What is the last grade level you completed in school?: GED, College  Interpreter Needed?: No  Information entered by :: [employer]   Activities of Daily Living     05/26/2023   12:52 PM 05/22/2023   11:03 AM  In your present state of health, do you have any difficulty performing the following  activities:  Hearing? 1 0  Comment followed by opthomology   Vision? 0 1  Difficulty concentrating or making decisions? 0 1  Walking or climbing stairs? 1 1  Comment uses walker   Dressing or bathing? 0 1  Doing errands, shopping? 1 1  Comment does not drive   Preparing Food and eating ?  Y  Using the Toilet?  Y  In the past six months, have you accidently leaked urine?  Y  Do you have problems with loss of bowel control?  N  Managing your Medications?  N  Managing your Finances?  N  Housekeeping or managing your Housekeeping?  Y    Patient Care Team: Dorothe Gaster, NP as PCP - General (Nurse Practitioner)  Indicate any recent Medical Services you may have received from other than [facility] providers in the past year (date may be approximate).     Assessment:   This is a routine wellness examination for Randy Cordova.  Hearing/Vision screen Hearing Screening - Comments:: No hearing issues   Goals Addressed             This Visit's Progress    Patient Stated       Patient wants to lose weight       Depression Screen     05/26/2023    1:12 PM 05/26/2023   12:51 PM 12/23/2022    8:52 AM 10/16/2021    4:08 PM  PHQ 2/9 Scores  PHQ - 2 Score 6 6 3 3   PHQ- 9 Score 21 21  12     Fall Risk     05/26/2023   12:51 PM 05/22/2023   11:03 AM 10/03/2022    2:02 PM  Fall Risk   Falls in the past year? 1 1 1   Number falls in past yr: 0 1 1  Injury with Fall? 1 1 0  Risk for fall due to : History of fall(s);Impaired balance/gait History of fall(s);Impaired balance/gait History of fall(s)  Follow up Falls evaluation  completed Falls evaluation completed Falls evaluation completed    MEDICARE RISK AT HOME:  Medicare Risk at Home Any stairs in or around the home?: (Patient-Rptd) Yes If so, are there any without handrails?: (Patient-Rptd) No Home free of loose throw rugs in walkways, pet beds, electrical cords, etc?: (Patient-Rptd) Yes Adequate lighting in your home to reduce risk of  falls?: (Patient-Rptd) Yes Life alert?: (Patient-Rptd) No Use of a cane, walker or w/c?: Yes (cane and walker) Grab bars in the bathroom?: (Patient-Rptd) Yes Shower chair or bench in shower?: (Patient-Rptd) Yes Elevated toilet seat or a handicapped toilet?: (Patient-Rptd) No  TIMED UP AND GO:  Was the test performed?  No  Cognitive Function: 6CIT completed        05/26/2023    1:08 PM  6CIT Screen  What Year? 0 points  What month? 0 points  What time? 0 points  Count back from 20 0 points  Months in reverse 0 points  Repeat phrase 0 points  Total Score 0 points    Immunizations Immunization History  Administered Date(s) Administered   Fluad Quad(high Dose 65+) 04/19/2020   Fluzone Influenza virus vaccine,trivalent (IIV3), split virus 12/21/2015, 10/10/2017   Influenza, Seasonal, Injecte, Preservative Fre 12/23/2022   Influenza,inj,Quad PF,6+ Mos 10/15/2021   Moderna Sars-Covid-2 Vaccination 09/04/2019, 10/01/2019, 05/28/2020   Pfizer Covid-19 Vaccine Bivalent Booster 19yrs & up 12/14/2020   Zoster Recombinant(Shingrix ) 10/15/2021, 12/21/2021    Screening Tests Health Maintenance  Topic Date Due   DTaP/Tdap/Td (1 - Tdap) Never done   Pneumococcal Vaccine 31-67 Years old (1 of 2 - PCV) Never done   Colonoscopy  Never done   Lung Cancer Screening  Never done   COVID-19 Vaccine (5 - 2024-25 season) 12/23/2023 (Originally 10/06/2022)   Diabetic kidney evaluation - Urine ACR  12/22/2026 (Originally 12/22/2022)   INFLUENZA VACCINE  09/05/2023   Diabetic kidney evaluation - eGFR measurement  12/23/2023   Medicare Annual Wellness (AWV)  05/25/2024   Hepatitis C Screening  Completed   HIV Screening  Completed   Zoster Vaccines- Shingrix   Completed   HPV VACCINES  Aged Out   Meningococcal B Vaccine  Aged Out   FOOT EXAM  Discontinued   HEMOGLOBIN A1C  Discontinued   OPHTHALMOLOGY EXAM  Discontinued    Health Maintenance  Health Maintenance Due  Topic Date Due    DTaP/Tdap/Td (1 - Tdap) Never done   Pneumococcal Vaccine 1-74 Years old (1 of 2 - PCV) Never done   Colonoscopy  Never done   Lung Cancer Screening  Never done   Health Maintenance Items Addressed:patient will discuss health maintenance with pcp at today appointment.  Additional Screening:  Vision Screening: Recommended annual ophthalmology exams for early detection of glaucoma and other disorders of the eye.  Dental Screening: Recommended annual dental exams for proper oral hygiene  Community Resource Referral / Chronic Care Management: CRR required this visit?  No   CCM required this visit?  No     Plan:     I have personally reviewed and noted the following in the patient's chart:   Medical and social history Use of alcohol, tobacco or illicit drugs  Current medications and supplements including opioid prescriptions. Patient is not currently taking opioid prescriptions. Functional ability and status Nutritional status Physical activity Advanced directives List of other physicians Hospitalizations, surgeries, and ER visits in previous 12 months Vitals Screenings to include cognitive, depression, and falls Referrals and appointments  In addition, I have reviewed and discussed with  patient certain preventive protocols, quality metrics, and best practice recommendations. A written personalized care plan for preventive services as well as general preventive health recommendations were provided to patient.     Freeda Jerry, [place]   05/26/2023   After Visit Summary: (MyChart) Due to this being a telephonic visit, the after visit summary with patients personalized plan was offered to patient via MyChart   Notes: Nothing significant to report at this time.

## 2023-05-26 NOTE — Assessment & Plan Note (Signed)
 Patient has gained a couple pounds since last office visit but blood pressure still within normal limits and controlled/maintained on lifestyle modifications only.

## 2023-05-26 NOTE — Assessment & Plan Note (Signed)
 History of the same.  Will see if patient qualifies for Wegovy  for nonalcoholic fatty liver disease.  Patient denies having a personal or family history of medullary thyroid  cancer or multiple endocrine neoplasia syndrome type II.

## 2023-05-26 NOTE — Patient Instructions (Signed)
 Mr. Beg , Thank you for taking time to come for your Medicare Wellness Visit. I appreciate your ongoing commitment to your health goals. Please review the following plan we discussed and let me know if I can assist you in the future.   Referrals/Orders/Follow-Ups/Clinician Recommendations: follow up next year for next annual wellness visit.  This is a list of the screening recommended for you and due dates:  Health Maintenance  Topic Date Due   DTaP/Tdap/Td vaccine (1 - Tdap) Never done   Pneumococcal Vaccination (1 of 2 - PCV) Never done   Colon Cancer Screening  Never done   Screening for Lung Cancer  Never done   COVID-19 Vaccine (5 - 2024-25 season) 12/23/2023*   Yearly kidney health urinalysis for diabetes  12/22/2026*   Flu Shot  09/05/2023   Yearly kidney function blood test for diabetes  12/23/2023   Medicare Annual Wellness Visit  05/25/2024   Hepatitis C Screening  Completed   HIV Screening  Completed   Zoster (Shingles) Vaccine  Completed   HPV Vaccine  Aged Out   Meningitis B Vaccine  Aged Out   Complete foot exam   Discontinued   Hemoglobin A1C  Discontinued   Eye exam for diabetics  Discontinued  *Topic was postponed. The date shown is not the original due date.    Advanced directives: (Copy Requested) Please bring a copy of your health care power of attorney and living will to the office to be added to your chart at your convenience. You can mail to [facility] [address] [place], [place] [address] or email to [email or web address]  Next Medicare Annual Wellness Visit scheduled for next year: Yes

## 2023-05-27 LAB — COMPREHENSIVE METABOLIC PANEL WITH GFR
ALT: 43 U/L (ref 0–53)
AST: 29 U/L (ref 0–37)
Albumin: 4.1 g/dL (ref 3.5–5.2)
Alkaline Phosphatase: 85 U/L (ref 39–117)
BUN: 12 mg/dL (ref 6–23)
CO2: 25 meq/L (ref 19–32)
Calcium: 9 mg/dL (ref 8.4–10.5)
Chloride: 108 meq/L (ref 96–112)
Creatinine, Ser: 0.9 mg/dL (ref 0.40–1.50)
GFR: 98.41 mL/min (ref >60.00)
Glucose, Bld: 87 mg/dL (ref 70–99)
Potassium: 3.9 meq/L (ref 3.5–5.1)
Sodium: 142 meq/L (ref 135–145)
Total Bilirubin: 0.5 mg/dL (ref 0.2–1.2)
Total Protein: 7.4 g/dL (ref 6.0–8.3)

## 2023-05-27 LAB — CBC
HCT: 40.6 % (ref 39.0–52.0)
Hemoglobin: 13.6 g/dL (ref 13.0–17.0)
MCHC: 33.6 g/dL (ref 30.0–36.0)
MCV: 91.6 fl (ref 78.0–100.0)
Platelets: 107 10*3/uL — ABNORMAL LOW (ref 150.0–400.0)
RBC: 4.43 Mil/uL (ref 4.22–5.81)
RDW: 14.3 % (ref 11.5–15.5)
WBC: 5.2 10*3/uL (ref 4.0–10.5)

## 2023-05-27 LAB — URINE CULTURE
MICRO NUMBER:: 16353606
Result:: NO GROWTH
SPECIMEN QUALITY:: ADEQUATE

## 2023-05-28 ENCOUNTER — Telehealth: Payer: Self-pay | Admitting: Nurse Practitioner

## 2023-05-28 ENCOUNTER — Encounter: Payer: Self-pay | Admitting: Nurse Practitioner

## 2023-05-28 NOTE — Telephone Encounter (Signed)
 Called and discussed the wegovy  appeal with the patient.  States that they thought it was for weight loss and NAFL

## 2023-05-28 NOTE — Telephone Encounter (Signed)
 Contacted pt regarding Denial of Wegoy. Pt states he needs to speak with PCP regarding an appeal and states he knows why PA was denied.

## 2023-05-28 NOTE — Telephone Encounter (Signed)
 Pharmacy Patient Advocate Encounter  Received notification from [facility] + MEDICARE that Prior Authorization for WEGOVY  0.25MG /0.5ML has been DENIED.  See denial reason below. No denial letter attached in CMM. Will attach denial letter to Media tab once received.   PA #/Case ID/Reference #: [id number]

## 2023-05-29 NOTE — Telephone Encounter (Signed)
 Spoke with patient on the phone on  05/28/2023

## 2023-06-04 DIAGNOSIS — Z5181 Encounter for therapeutic drug level monitoring: Secondary | ICD-10-CM | POA: Diagnosis not present

## 2023-06-04 DIAGNOSIS — Z87820 Personal history of traumatic brain injury: Secondary | ICD-10-CM | POA: Diagnosis not present

## 2023-06-04 DIAGNOSIS — G40011 Localization-related (focal) (partial) idiopathic epilepsy and epileptic syndromes with seizures of localized onset, intractable, with status epilepticus: Secondary | ICD-10-CM | POA: Diagnosis not present

## 2023-06-04 DIAGNOSIS — Z4549 Encounter for adjustment and management of other implanted nervous system device: Secondary | ICD-10-CM | POA: Diagnosis not present

## 2023-06-05 ENCOUNTER — Ambulatory Visit: Admitting: Psychology

## 2023-06-05 DIAGNOSIS — F411 Generalized anxiety disorder: Secondary | ICD-10-CM
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
## 2023-06-12 ENCOUNTER — Ambulatory Visit: Admitting: Psychology

## 2023-06-12 ENCOUNTER — Encounter: Payer: Self-pay | Admitting: Psychology

## 2023-06-12 DIAGNOSIS — F411 Generalized anxiety disorder: Secondary | ICD-10-CM | POA: Diagnosis not present

## 2023-06-12 NOTE — Progress Notes (Signed)
 [facility] Counselor/Therapist Progress Note  Patient ID: Randy Cordova., MRN: [id number]   Date: 06/12/23  Time Spent: 11:02  am - 12:01 pm : 59 Minutes  Treatment Type: Individual Therapy.  Reported Symptoms: Anxiety and depression.   Mental Status Exam: Appearance:  Casual     Behavior: Appropriate  Motor: Normal  Speech/Language:  Clear and Coherent  Affect: Appropriate  Mood: dysthymic  Thought process: normal  Thought content:   WNL  Sensory/Perceptual disturbances:   WNL  Orientation: oriented to person, place, time/date, and situation  Attention: Good  Concentration: Good  Memory: WNL  Fund of knowledge:  Good  Insight:   Good  Judgment:  Good  Impulse Control: Good   Risk Assessment: Danger to Self:  No, but reported a history of this in the past. He denied any intent or plan during that time and noted numerous reasons to not act on thoughts including his relationships with his wife and friends.   Self-injurious Behavior: No Danger to Others: No Duty to Warn:no Physical Aggression / Violence:No  Access to Firearms a concern: No  Gang Involvement:No   In case of a mental health emergency:  86 - confidential suicide hotline. Visiting [facility]):        [address], [place] [address]       [phone] 3.   911  4.   Visiting Nearest ED.    Subjective:   Randy Cordova. participated from home, via video and consented to treatment. Therapist participated from home office. I discussed the limitations of evaluation and management by telemedicine and the availability of in person appointments. The patient expressed understanding and agreed to proceed. Demond reviewed the events of the past week.   We reviewed numerous treatment approaches including CBT, BA, Problem Solving, and Solution focused therapy. Psych-education regarding the Racer's diagnosis of Generalized anxiety disorder was provided  during the session. We discussed Fritzgerald Decandia Jr.'s goals treatment goals which include improve distress tolerance, manage frustrations more effectively, work on acceptance, identifying areas of control and lack of control, manage anxiety (ex. What ifs), thinking more positively, being more in the moment, manage negative self-talk, manage cognitive distortions, managing health related anxiety, reduce rumination, work on and manage expectations, and being more cognitively flexible. Randy Cordova. provided verbal approval of the treatment plan, maintaining safety.   Interventions: Psycho-education & Goal Setting.   Diagnosis:  Generalized anxiety disorder  Psychiatric Treatment: No , NA.   Treatment Plan:  Client Abilities/Strengths Randy Cordova is intelligent, introspective, and motivated for change.   Support System: Family and friends.   Client Treatment Preferences Outpatient Therapy.   Client Statement of Needs Randy Cordova would like to improve distress tolerance, manage frustrations more effectively, work on acceptance, identifying areas of control and lack of control, manage anxiety (ex. What ifs), thinking more positively, being more in the moment, manage negative self-talk, manage cognitive distortions, managing health related anxiety, reduce rumination, work on and manage expectations, and being more cognitively flexible.    Treatment Level Weekly  Symptoms  Anxiety: feeling anxious, difficulty managing worry, worrying about different things, trouble relaxing, irritability,    (Status: maintained) Depression: loss of interest, feeling down, insomnia sleep (some anxiety, pain, ), lethargy,  infrequent overeater, feeling bad about self, psychomotor retardation.   (Status: maintained)  Goals:   [place] experiences symptoms of Anxiety and depression.   Treatment plan signed and available on s-drive:  No, pending signature.  [name]  [name]. was sent the treatment plan signature  form on Date: 06/12/23.   Target Date: 06/11/24 Frequency: Weekly  Progress: 0 Modality: individual    Therapist will provide referrals for additional resources as appropriate.  Therapist will provide psycho-education regarding Randy Cordova's diagnosis and corresponding treatment approaches and interventions. Belva Boyden, LCSW will support the patient's ability to achieve the goals identified. will employ CBT, BA, Problem-solving, Solution Focused, Mindfulness,  coping skills, & other evidenced-based practices will be used to promote progress towards healthy functioning to help manage decrease symptoms associated with his diagnosis.   Reduce overall level, frequency, and intensity of the feelings of depression, anxiety and panic evidenced by decreased overall symptoms from 6 to 7 days/week to 0 to 1 days/week per client report for at least 3 consecutive months. Verbally express understanding of the relationship between feelings of depression, anxiety and their impact on thinking patterns and behaviors. Verbalize an understanding of the role that distorted thinking plays in creating fears, excessive worry, and ruminations.    [name] participated in the creation of the treatment plan)   Belva Boyden, LCSW

## 2023-06-13 ENCOUNTER — Encounter: Payer: Self-pay | Admitting: Nurse Practitioner

## 2023-06-13 ENCOUNTER — Telehealth: Admitting: Emergency Medicine

## 2023-06-13 ENCOUNTER — Ambulatory Visit: Payer: Self-pay

## 2023-06-13 DIAGNOSIS — R300 Dysuria: Secondary | ICD-10-CM | POA: Diagnosis not present

## 2023-06-13 NOTE — Progress Notes (Signed)
 Virtual Visit Consent   Randy Peary Jr., you are scheduled for a virtual visit with a [facility] provider today. Just as with appointments in the office, your consent must be obtained to participate. Your consent will be active for this visit and any virtual visit you may have with one of our providers in the next 365 days. If you have a MyChart account, a copy of this consent can be sent to you electronically.  As this is a virtual visit, video technology does not allow for your provider to perform a traditional examination. This may limit your provider's ability to fully assess your condition. If your provider identifies any concerns that need to be evaluated in person or the need to arrange testing (such as labs, EKG, etc.), we will make arrangements to do so. Although advances in technology are sophisticated, we cannot ensure that it will always work on either your end or our end. If the connection with a video visit is poor, the visit may have to be switched to a telephone visit. With either a video or telephone visit, we are not always able to ensure that we have a secure connection.  By engaging in this virtual visit, you consent to the provision of healthcare and authorize for your insurance to be billed (if applicable) for the services provided during this visit. Depending on your insurance coverage, you may receive a charge related to this service.  I need to obtain your verbal consent now. Are you willing to proceed with your visit today? Randy Cordova. has provided verbal consent on 06/13/2023 for a virtual visit (video or telephone). Blinda Burger, NP  Date: 06/13/2023 4:01 PM   Virtual Visit via Video Note   I, Blinda Burger, connected with  Randy Cordova.  ([id number], 1970-07-13) on 06/13/23 at  3:45 PM EDT by a video-enabled telemedicine application and verified that I am speaking with the correct person using two identifiers.  Location: Patient: Virtual Visit Location  Patient: Home Provider: Virtual Visit Location Provider: Home Office   I discussed the limitations of evaluation and management by telemedicine and the availability of in person appointments. The patient expressed understanding and agreed to proceed.    History of Present Illness: Randy Cordova. is a 53 y.o. who identifies as a male who was assigned male at birth, and is being seen today for incontinence since early April.  Incontinence is described as occasional dribbles/drops of urine.  Saw neurologist and they didn't think it is related to his epilepsy.  PCP Margarie Shay, NP on 05/26/2023 and mentions the incontinence.  UA and urine culture from that time did not show evidence of infection.  Patient reports that Mr. Cable offered him a referral to urology patient declined at that time.  2 days ago he developed a slight burning in the urethral meatus area that occurs only at the end of urination.  He does not have urinary frequency, he does not have urinary urgency, and other than the slight burning at the end of urination, he does not have dysuria.  Denies abdominal pain, back pain (different from chronic back pain), fever, chills, nausea, or vomiting.  He has been researching what could be causing his incontinence and combined with the new burning sensation with urination, he wonders if he has a urinary tract infection.  He reached out to his PCPs office who would not prescribe him antibiotics without a urinalysis.  He does not have transportation (cannot drive due to  epilepsy) and cannot be seen by his PCP until 06/18/2023 when he has a ride.  Wants to know if he can get antibiotics today for treatment for UTI in case that is what is causing his symptoms  HPI: HPI  Problems:  Patient Active Problem List   Diagnosis Date Noted   Urinary incontinence 05/26/2023   History of seizure 12/23/2022   Inadequate pain control 10/21/2022   Foot pain, right 10/03/2022   Localized swelling of lower extremity  10/03/2022   COVID-19 01/22/2022   Preventative health care 12/21/2021   Prediabetes 12/21/2021   Rash 12/21/2021   Need for shingles vaccine 12/21/2021   Vitamin D deficiency 12/23/2020   Tobacco user 12/23/2020   Primary hypertension 12/23/2020   Osteoarthritis of both knees 12/23/2020   Nonalcoholic fatty liver disease 12/23/2020   Hyperlipidemia 12/23/2020   Hepatic cirrhosis (HCC) 12/23/2020   Generalized anxiety disorder 12/23/2020   Arthralgia of hip 12/23/2020   Epilepsy (HCC) 12/04/2020   Acute left hemiparesis (HCC) 12/04/2020   Morbid obesity (HCC) 12/04/2020   History of traumatic brain injury 06/30/2020   Grand mal seizure (HCC) 04/18/2020   Breakthrough seizures, recurrent (HCC) 03/19/2020   Thrombocytopenia (HCC)    OSA on CPAP    Hypothyroidism 10/29/2010   Decreased testosterone level 10/29/2010    Allergies:  Allergies  Allergen Reactions   Aspartame And Phenylalanine     Headache   Medications:  Current Outpatient Medications:    acetaminophen  (TYLENOL ) 325 MG tablet, Take 2 tablets (650 mg total) by mouth every 8 (eight) hours as needed., Disp: , Rfl:    atorvastatin  (LIPITOR) 20 MG tablet, TAKE 1 TABLET BY MOUTH DAILY, Disp: 90 tablet, Rfl: 1   cloBAZam  (ONFI ) 20 MG tablet, Take 1 tablet by mouth at bedtime., Disp: , Rfl:    hydrocortisone 2.5 % lotion, Apply topically., Disp: , Rfl:    ketoconazole  (NIZORAL ) 2 % cream, Apply topically daily., Disp: 30 g, Rfl: 0   levETIRAcetam  (KEPPRA ) 750 MG tablet, Take 2 tablets (1,500 mg total) by mouth 2 (two) times daily., Disp: 120 tablet, Rfl: 2   meloxicam  (MOBIC ) 15 MG tablet, TAKE 1 TABLET BY MOUTH DAILY, Disp: 90 tablet, Rfl: 1   nystatin cream (MYCOSTATIN), Apply topically., Disp: , Rfl:    oxcarbazepine  (TRILEPTAL ) 600 MG tablet, Take 2 tablets by mouth in the morning and at bedtime., Disp: , Rfl:    pyridOXINE  (B-6) 200 MG tablet, Take 1 tablet (200 mg total) by mouth daily., Disp: 30 tablet, Rfl: 1    Semaglutide -Weight Management (WEGOVY ) 0.25 MG/0.5ML SOAJ, Inject 0.25 mg into the skin once a week., Disp: 2 mL, Rfl: 0   triamcinolone  cream (KENALOG ) 0.1 %, Apply topically., Disp: , Rfl:   Observations/Objective: Patient is well-developed, well-nourished in no acute distress.  Resting comfortably  at home.  Head is normocephalic, atraumatic.  No labored breathing.  Speech is clear and coherent with logical content.  Patient is alert and oriented at baseline.    Assessment and Plan: 1. Burning with urination (Primary)  I do think he needs a urinalysis and testing of his urine prior to the prescription of antibiotics.  His symptoms do not sound exactly like a UTI.  I encouraged him to seek follow-up with his PCP for urinalysis and antibiotics if needed.  I recommended he take his PCP up on the offer of a referral to urology for evaluation of incontinence.  Follow Up Instructions: I discussed the assessment and treatment plan with  the patient. The patient was provided an opportunity to ask questions and all were answered. The patient agreed with the plan and demonstrated an understanding of the instructions.  A copy of instructions were sent to the patient via MyChart unless otherwise noted below.   The patient was advised to call back or seek an in-person evaluation if the symptoms worsen or if the condition fails to improve as anticipated.    Blinda Burger, NP

## 2023-06-13 NOTE — Telephone Encounter (Signed)
 I am okay to see the patient on Wednesday.  I have 2 hospital follow-ups and same-day add-on he can have any of those appointments that he wishes

## 2023-06-13 NOTE — Telephone Encounter (Signed)
 I would need a urine sample in order to prescribe abx. He can use over the counter AZO to help with the discomfort and see if that resolves it

## 2023-06-13 NOTE — Telephone Encounter (Signed)
 Called and spoke to pt. He said he could not get transportation to be get here until possibly Wednesday. I advised him that he could go on his MyChart and select Visits and set up a Virtual Visit on the Virtual Urgent Care. The worst they can say is they cannot treat him. He will try that, but wanted to ask Jolan Natal if he could work him in Wednesday 5-14.

## 2023-06-13 NOTE — Patient Instructions (Signed)
 [name], thank you for joining Blinda Burger, NP for today's virtual visit.  While this provider is not your primary care provider (PCP), if your PCP is located in our provider database this encounter information will be shared with them immediately following your visit.   A [facility] MyChart account gives you access to today's visit and all your visits, tests, and labs performed at [facility] " click here if you don't have a [facility] MyChart account or go to [email or web address]  Consent: (Patient) Randy Cordova. provided verbal consent for this virtual visit at the beginning of the encounter.  Current Medications:  Current Outpatient Medications:    acetaminophen  (TYLENOL ) 325 MG tablet, Take 2 tablets (650 mg total) by mouth every 8 (eight) hours as needed., Disp: , Rfl:    atorvastatin  (LIPITOR) 20 MG tablet, TAKE 1 TABLET BY MOUTH DAILY, Disp: 90 tablet, Rfl: 1   cloBAZam  (ONFI ) 20 MG tablet, Take 1 tablet by mouth at bedtime., Disp: , Rfl:    hydrocortisone 2.5 % lotion, Apply topically., Disp: , Rfl:    ketoconazole  (NIZORAL ) 2 % cream, Apply topically daily., Disp: 30 g, Rfl: 0   levETIRAcetam  (KEPPRA ) 750 MG tablet, Take 2 tablets (1,500 mg total) by mouth 2 (two) times daily., Disp: 120 tablet, Rfl: 2   meloxicam  (MOBIC ) 15 MG tablet, TAKE 1 TABLET BY MOUTH DAILY, Disp: 90 tablet, Rfl: 1   nystatin cream (MYCOSTATIN), Apply topically., Disp: , Rfl:    oxcarbazepine  (TRILEPTAL ) 600 MG tablet, Take 2 tablets by mouth in the morning and at bedtime., Disp: , Rfl:    pyridOXINE  (B-6) 200 MG tablet, Take 1 tablet (200 mg total) by mouth daily., Disp: 30 tablet, Rfl: 1   Semaglutide -Weight Management (WEGOVY ) 0.25 MG/0.5ML SOAJ, Inject 0.25 mg into the skin once a week., Disp: 2 mL, Rfl: 0   triamcinolone  cream (KENALOG ) 0.1 %, Apply topically., Disp: , Rfl:    Medications ordered in this encounter:  No orders of the defined types were placed in  this encounter.    *If you need refills on other medications prior to your next appointment, please contact your pharmacy*  Follow-Up: Call back or seek an in-person evaluation if the symptoms worsen or if the condition fails to improve as anticipated.  [facility] [phone]  Other Instructions  I do not recommend antibiotics without urine testing.  Based on your current symptoms, I think you are okay to wait until you can see your PCP next week.  However, if you develop worsening symptoms, such as not being able to empty your bladder even though you feel the need to pee, severe abdominal pain, vomiting, or urinary frequency but only peeing small amounts (not normal volumes), please seek immediate care.   If you have been instructed to have an in-person evaluation today at a local Urgent Care facility, please use the link below. It will take you to a list of all of our available [facility], including address, phone number and hours of operation. Please do not delay care.  [facility]  If you or a family member do not have a primary care provider, use the link below to schedule a visit and establish care. When you choose a [facility] primary care physician or advanced practice provider, you gain a long-term partner in health. Find a Primary Care Provider  Learn more about [facility]'s in-office and virtual care options: [facility] - Get  Care Now

## 2023-06-13 NOTE — Telephone Encounter (Signed)
  Chief Complaint: urinary symptoms Symptoms: burning on urination, incontinence Frequency: constant Pertinent Negatives: Patient denies fever Disposition: [] ED /[] Urgent Care (no appt availability in office) / [x] Appointment(In office/virtual)/ []  [facility]/ [] Home Care/ [x] Refused Recommended Disposition /[] [facility]/ [x]  Follow-up with PCP Additional Notes:  Last visit with Jolan Natal they talked about urinary incontinence. History of epilepsy, ambulated with cane and walker. Calling today for new onset burning on urination. Wondering if UTI was causing incontinence. Requesting Dr. Tivis Forster send in prescription for uti symptoms. Uses [employer]. Advised acute visit but patient declines, he is unable to drive due to seizure disorder and earliest he would be able to come into office wouldn't be until Tuesday. Dr. Tivis Forster please advise.    Copied from CRM [id number]. Topic: Clinical - Red Word Triage >> Jun 13, 2023 10:27 AM Rosamond Comes wrote: Red Word that prompted transfer to Nurse Triage: patient calling in. Patient stated UTI starting  to burning when urinating along with incontinence Reason for Disposition  All other males with painful urination  Protocols used: Urination Pain - Male-A-AH

## 2023-06-16 NOTE — Telephone Encounter (Signed)
 Called patient moved as requested. He will call if any further questions.

## 2023-06-18 ENCOUNTER — Ambulatory Visit (INDEPENDENT_AMBULATORY_CARE_PROVIDER_SITE_OTHER): Admitting: Nurse Practitioner

## 2023-06-18 ENCOUNTER — Encounter: Payer: Self-pay | Admitting: Nurse Practitioner

## 2023-06-18 VITALS — BP 128/64 | HR 85 | Temp 98.2°F | Ht 70.5 in | Wt >= 6400 oz

## 2023-06-18 DIAGNOSIS — R8290 Unspecified abnormal findings in urine: Secondary | ICD-10-CM | POA: Diagnosis not present

## 2023-06-18 DIAGNOSIS — N3090 Cystitis, unspecified without hematuria: Secondary | ICD-10-CM | POA: Insufficient documentation

## 2023-06-18 DIAGNOSIS — R300 Dysuria: Secondary | ICD-10-CM | POA: Diagnosis not present

## 2023-06-18 LAB — POC URINALSYSI DIPSTICK (AUTOMATED)
Bilirubin, UA: 2
Blood, UA: NEGATIVE
Glucose, UA: NEGATIVE
Ketones, UA: 5
Nitrite, UA: NEGATIVE
Protein, UA: POSITIVE — AB
Spec Grav, UA: 1.025 (ref 1.010–1.025)
Urobilinogen, UA: 1 U/dL
pH, UA: 5.5 (ref 5.0–8.0)

## 2023-06-18 MED ORDER — SULFAMETHOXAZOLE-TRIMETHOPRIM 800-160 MG PO TABS: 1.0000 | ORAL_TABLET | Freq: Two times a day (BID) | ORAL | 0 refills | Status: AC

## 2023-06-18 NOTE — Assessment & Plan Note (Signed)
 UA in office

## 2023-06-18 NOTE — Assessment & Plan Note (Addendum)
 UA with trace leukocyte along with ketones and bilirubin.  Will check liver profile with patient with a history of liver dysfunction and bilirubin and urine.  Will treat with Bactrim DS 1 tab twice daily for 7 days.  Pending urine culture

## 2023-06-18 NOTE — Assessment & Plan Note (Signed)
 Abnormal urinalysis pending urine culture

## 2023-06-18 NOTE — Progress Notes (Signed)
 Acute Office Visit  Subjective:     Patient ID: Randy Cordova., male    DOB: January 08, 1971, 53 y.o.   MRN: [id number]  Chief Complaint  Patient presents with   Dysuria    Started 6 days ago. Has gotten better yesterday and today.    Urinary Incontinence    Started in the middle of March. Saw Neuro 06-04-23. She does not think it is related to his Epilepsy.      Patient is in today for urinary complaints with a history of Epilepsy, nonalcoholic fatty liver, osa, htn, prediabetes  Urinary incontinence: this has been going on since march. He recently saw neurology as he thought it may relate to his seizure disorder but they think not. States that he is having to wear a pad through out the day and he will leak a little bit of urine. States that on Thursday he got up and went to urinate he has some dysuria. States that it was persistent with each time he urinated.  He did have an elevated metabolite of his Onfi  that could be causing some of his symptoms.  Patient was evaluated by telehealth and recommended follow-up with PCP prior to prescribing any antibiotics  States that he does use a brush and is wondering if he scraped his self and may have gotten som prescription shampoo and may have scraped himself and the urine is acidic causing burning  States that Thursday he had the episode of dysuria and urinary hesitancy. Review of Systems  Constitutional:  Negative for chills and fever.  Respiratory:  Negative for shortness of breath.   Cardiovascular:  Negative for chest pain.  Genitourinary:  Positive for dysuria.       Hesitancy   Neurological:  Negative for headaches.        Objective:    BP 128/64 (BP Location: Right Arm, Patient Position: Sitting, Cuff Size: Large)   Pulse 85   Temp 98.2 F (36.8 C) (Oral)   Ht 5' 10.5" (1.791 m)   Wt (!) 450 lb (204.1 kg)   SpO2 95%   BMI 63.66 kg/m  BP Readings from Last 3 Encounters:  06/18/23 128/64  05/26/23 132/82  05/26/23 128/82    Wt Readings from Last 3 Encounters:  06/18/23 (!) 450 lb (204.1 kg)  05/26/23 (!) 457 lb (207.3 kg)  05/26/23 (!) 457 lb (207.3 kg)   SpO2 Readings from Last 3 Encounters:  06/18/23 95%  05/26/23 95%  12/23/22 96%      Physical Exam Vitals and nursing note reviewed.  Constitutional:      Appearance: Normal appearance.  Cardiovascular:     Rate and Rhythm: Normal rate and regular rhythm.     Heart sounds: Normal heart sounds.  Pulmonary:     Effort: Pulmonary effort is normal.     Breath sounds: Normal breath sounds.  Abdominal:     General: Bowel sounds are normal.     Tenderness: There is no abdominal tenderness. There is no right CVA tenderness or left CVA tenderness.  Neurological:     Mental Status: He is alert.     Results for orders placed or performed in visit on 06/18/23  POCT Urinalysis Dipstick (Automated)  Result Value Ref Range   Color, UA Amber    Clarity, UA clear    Glucose, UA Negative Negative   Bilirubin, UA 2 mg/dL    Ketones, UA 5 mg/dL    Spec Grav, UA 1.191 1.010 - 1.025  Blood, UA neg    pH, UA 5.5 5.0 - 8.0   Protein, UA Positive (A) Negative   Urobilinogen, UA 1.0 0.2 or 1.0 E.U./dL   Nitrite, UA neg    Leukocytes, UA Trace (A) Negative        Assessment & Plan:   Problem List Items Addressed This Visit       Genitourinary   Cystitis   UA with trace leukocyte along with ketones and bilirubin.  Will check liver profile with patient with a history of liver dysfunction and bilirubin and urine.  Will treat with Bactrim DS 1 tab twice daily for 7 days.  Pending urine culture      Relevant Medications   sulfamethoxazole-trimethoprim (BACTRIM DS) 800-160 MG tablet     Other   Abnormal urinalysis   Abnormal urinalysis pending urine culture      Relevant Orders   Hepatic function panel   Dysuria - Primary   UA in office      Relevant Orders   POCT Urinalysis Dipstick (Automated) (Completed)   Urine Culture    Meds  ordered this encounter  Medications   sulfamethoxazole-trimethoprim (BACTRIM DS) 800-160 MG tablet    Sig: Take 1 tablet by mouth 2 (two) times daily.    Dispense:  14 tablet    Refill:  0    Supervising Provider:   Deri Fleet A [[id number]]    No follow-ups on file.  Margarie Shay, NP

## 2023-06-19 ENCOUNTER — Ambulatory Visit: Admitting: Psychology

## 2023-06-19 DIAGNOSIS — F411 Generalized anxiety disorder: Secondary | ICD-10-CM | POA: Diagnosis not present

## 2023-06-19 LAB — HEPATIC FUNCTION PANEL
ALT: 25 U/L (ref 0–53)
AST: 20 U/L (ref 0–37)
Albumin: 4.2 g/dL (ref 3.5–5.2)
Alkaline Phosphatase: 95 U/L (ref 39–117)
Bilirubin, Direct: 0.1 mg/dL (ref 0.0–0.3)
Total Bilirubin: 0.4 mg/dL (ref 0.2–1.2)
Total Protein: 7.5 g/dL (ref 6.0–8.3)

## 2023-06-19 LAB — URINE CULTURE
MICRO NUMBER:: 16454784
Result:: NO GROWTH
SPECIMEN QUALITY:: ADEQUATE

## 2023-06-19 NOTE — Progress Notes (Signed)
 [facility] Counselor/Therapist Progress Note  Patient ID: Randy Cada., MRN: [id number]   Date: 06/19/23  Time Spent: 9:03  am - 9:51 am : 48 Minutes  Treatment Type: Individual Therapy.  Reported Symptoms: Anxiety and depression.   Mental Status Exam: Appearance:  Casual     Behavior: Appropriate  Motor: Normal  Speech/Language:  Clear and Coherent  Affect: Appropriate  Mood: dysthymic  Thought process: normal  Thought content:   WNL  Sensory/Perceptual disturbances:   WNL  Orientation: oriented to person, place, time/date, and situation  Attention: Good  Concentration: Good  Memory: WNL  Fund of knowledge:  Good  Insight:   Good  Judgment:  Good  Impulse Control: Good   Risk Assessment: Danger to Self:  No, but reported a history of this in the past. He denied any intent or plan during that time and noted numerous reasons to not act on thoughts including his relationships with his wife and friends.   Self-injurious Behavior: No Danger to Others: No Duty to Warn:no Physical Aggression / Violence:No  Access to Firearms a concern: No  Gang Involvement:No   In case of a mental health emergency:  89 - confidential suicide hotline. [facility]        [address], [place] [address]       [phone] 3.   911  4.   Visiting Nearest ED.    Subjective:   Randy Fix. participated from home, via video and consented to treatment. Therapist participated from home office. I discussed the limitations of evaluation and management by telemedicine and the availability of in person appointments. The patient expressed understanding and agreed to proceed. Bralynn reviewed the events of the past week. He noted often being triggered by thunder and noted this affecting his mood and ability sleep. He noted this resulting in flashbacks but denied this being a PTSD or a diagnosis of. We worked on exploring his  experience during the session. He noted a recent lab result that was out of normal and noted his anxiety regarding this. He noted his provider making an adjustment to his seizure medication and noted this increasing the likelihood of seizures. He noted a need to work on managing his anger. He noted often being "sarcastic" and "calling people an asshole but only if they are an asshole". He noted worry that he might react poorly at some point. He noted his prediction that "I won't be able to ignore stupidity or ignore a bully". 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 He noted "my hole life has been about right and wrong". We worked on identifying his triggers and warning signs. Therapist provided handouts, via email,  for identifying his triggers and warning sign. He noted his grandfather telling him, at a young age, that he had a "duty to protect others" and noted that he "always defended those who can't defend themselves" and noted that he was raised this way which resulted in often interceding. He noted often hoping this feedback would affect positive change. He noted having an "inability to not do something". 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 Therapist praised [name] for his awareness regarding his frustration tolerance and his need for change. Therapist validated Randy Cordova's feelings and experience and provided supportive therapy. A follow-up was scheduled for continued treatment, which he continues to  benefit from.   Interventions: CBT  Diagnosis:  Generalized anxiety disorder  Psychiatric Treatment: No , NA.   Treatment Plan:  Client Abilities/Strengths Randy Cordova is intelligent, introspective, and motivated for change.   Support System: Family and friends.   Client Treatment Preferences Outpatient Therapy.   Client Statement of Needs Randy Cordova would like to improve distress tolerance, manage  frustrations more effectively, work on acceptance, identifying areas of control and lack of control, manage anxiety (ex. What ifs), thinking more positively, being more in the moment, manage negative self-talk, manage cognitive distortions, managing health related anxiety, reduce rumination, work on and manage expectations, and being more cognitively flexible.    Treatment Level Weekly  Symptoms  Anxiety: feeling anxious, difficulty managing worry, worrying about different things, trouble relaxing, irritability,    (Status: maintained) Depression: loss of interest, feeling down, insomnia sleep (some anxiety, pain, ), lethargy,  infrequent overeater, feeling bad about self, psychomotor retardation.   (Status: maintained)  Goals:   Randy Cordova experiences symptoms of Anxiety and depression.   Treatment plan signed and available on s-drive:  No, pending signature.  Randy Fix. was sent the treatment plan signature form on Date: 06/19/23.   Target Date: 06/11/24 Frequency: Weekly  Progress: 0 Modality: individual    Therapist will provide referrals for additional resources as appropriate.  Therapist will provide psycho-education regarding Randy Cordova's diagnosis and corresponding treatment approaches and interventions. Belva Boyden, LCSW will support the patient's ability to achieve the goals identified. will employ CBT, BA, Problem-solving, Solution Focused, Mindfulness,  coping skills, & other evidenced-based practices will be used to promote progress towards healthy functioning to help manage decrease symptoms associated with his diagnosis.   Reduce overall level, frequency, and intensity of the feelings of depression, anxiety and panic evidenced by decreased overall symptoms from 6 to 7 days/week to 0 to 1 days/week per client report for at least 3 consecutive months. Verbally express understanding of the relationship between feelings of depression, anxiety and their impact on thinking patterns  and behaviors. Verbalize an understanding of the role that distorted thinking plays in creating fears, excessive worry, and ruminations.    Randy Cordova Last participated in the creation of the treatment plan)   Belva Boyden, LCSW

## 2023-06-20 ENCOUNTER — Ambulatory Visit: Payer: Self-pay | Admitting: Nurse Practitioner

## 2023-06-25 ENCOUNTER — Ambulatory Visit: Admitting: Nurse Practitioner

## 2023-06-27 ENCOUNTER — Ambulatory Visit (INDEPENDENT_AMBULATORY_CARE_PROVIDER_SITE_OTHER): Admitting: Psychology

## 2023-06-27 DIAGNOSIS — F411 Generalized anxiety disorder: Secondary | ICD-10-CM | POA: Diagnosis not present

## 2023-06-27 NOTE — Progress Notes (Signed)
 [facility] Counselor/Therapist Progress Note  Patient ID: Randy Cordova., MRN: [id number]   Date: 06/27/23  Time Spent: 10:03 am - 10:56 am : 53 Minutes  Treatment Type: Individual Therapy.  Reported Symptoms: Anxiety and depression.   Mental Status Exam: Appearance:  Casual     Behavior: Appropriate  Motor: Normal  Speech/Language:  Clear and Coherent  Affect: Appropriate  Mood: dysthymic  Thought process: normal  Thought content:   WNL  Sensory/Perceptual disturbances:   WNL  Orientation: oriented to person, place, time/date, and situation  Attention: Good  Concentration: Good  Memory: WNL  Fund of knowledge:  Good  Insight:   Good  Judgment:  Good  Impulse Control: Good   Risk Assessment: Danger to Self:  No, but reported a history of this in the past. He denied any intent or plan during that time and noted numerous reasons to not act on thoughts including his relationships with his wife and friends.   Self-injurious Behavior: No Danger to Others: No Duty to Warn:no Physical Aggression / Violence:No  Access to Firearms a concern: No  Gang Involvement:No   In case of a mental health emergency:  86 - confidential suicide hotline. [facility]):        [address], [place] [address]       [phone] 3.   911  4.   Visiting Nearest ED.    Subjective:   Randy Cordova. participated from home, via video and consented to treatment. Therapist participated from home office. Randy Cordova noted recently having a seizure and falling and coming to consciousness two hours later. He noted averaging ~ 1 seizure a week after his medication was reduced due to his recent labs. 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 He noted having  over 100 concussions in his lifetime. He noted being in considerable knee pain and noted a provider recommended that he lose weight to improve his pain. He noted that it is difficult to do so as he gains muscle quickly and that the nest result is maintaining or gaining weight. We worked on processing his list of anger warning signs and triggers. His anger warning signs include "using insults", "eyes tightening", "looking through people", raise voice/shout, argumentative, "Staring and scowling", feeling "annoyed", and his "marine look". He noted being aware of many of these behaviors and reactions but not all. His triggers including being asked rude questions, others around him being mistreated, places that bring up bad memories, viewing war movies, waiting a long time for an appointment, and doctors not listening to his feedback. He noted a need manage his frustration more effectively going forward. Therapist introduced a DBT tool - REST, and provided this via email for reference and review. We will work on in-depth review of this tool during our follow-up. Randy Cordova was engaged and motivated during the session. He expressed commitment towards goals. Therapist praised Randy Cordova for his effort and provided supportive therapy. A follow-up was scheduled for continued treatment which he benefits from.   Interventions: CBT  Diagnosis:  Generalized anxiety disorder  Psychiatric Treatment: No , NA.   Treatment Plan:  Client Abilities/Strengths Randy Cordova is intelligent, introspective, and motivated for change.   Support System: Family and friends.   Client Treatment Preferences Outpatient Therapy.   Client Statement of Needs Randy Cordova  would like to improve distress tolerance, manage frustrations more effectively, work on acceptance, identifying areas of control and lack of control, manage anxiety (ex. What ifs), thinking more positively, being more in the moment, manage negative self-talk, manage cognitive  distortions, managing health related anxiety, reduce rumination, work on and manage expectations, and being more cognitively flexible.    Treatment Level Weekly  Symptoms  Anxiety: feeling anxious, difficulty managing worry, worrying about different things, trouble relaxing, irritability,    (Status: maintained) Depression: loss of interest, feeling down, insomnia sleep (some anxiety, pain, ), lethargy,  infrequent overeater, feeling bad about self, psychomotor retardation.   (Status: maintained)  Goals:   Randy Cordova experiences symptoms of Anxiety and depression.   Treatment plan signed and available on s-drive:  No, pending signature.  Randy Cordova. was sent the treatment plan signature form on Date: 06/27/23.   Target Date: 06/11/24 Frequency: Weekly  Progress: 0 Modality: individual    Therapist will provide referrals for additional resources as appropriate.  Therapist will provide psycho-education regarding Randy Cordova's diagnosis and corresponding treatment approaches and interventions. Randy Boyden, LCSW will support the patient's ability to achieve the goals identified. will employ CBT, BA, Problem-solving, Solution Focused, Mindfulness,  coping skills, & other evidenced-based practices will be used to promote progress towards healthy functioning to help manage decrease symptoms associated with his diagnosis.   Reduce overall level, frequency, and intensity of the feelings of depression, anxiety and panic evidenced by decreased overall symptoms from 6 to 7 days/week to 0 to 1 days/week per client report for at least 3 consecutive months. Verbally express understanding of the relationship between feelings of depression, anxiety and their impact on thinking patterns and behaviors. Verbalize an understanding of the role that distorted thinking plays in creating fears, excessive worry, and ruminations.    Randy Cordova Last participated in the creation of the treatment plan)   Randy Boyden,  LCSW

## 2023-07-03 ENCOUNTER — Ambulatory Visit: Admitting: Psychology

## 2023-07-03 DIAGNOSIS — F411 Generalized anxiety disorder: Secondary | ICD-10-CM | POA: Diagnosis not present

## 2023-07-03 NOTE — Progress Notes (Signed)
 [facility] Counselor/Therapist Progress Note  Patient ID: Randy Cordova., MRN: [id number]   Date: 07/03/23  Time Spent: 9:04 am - 10:03 am : 59 Minutes  Treatment Type: Individual Therapy.  Reported Symptoms: Anxiety and depression.   Mental Status Exam: Appearance:  Casual     Behavior: Appropriate  Motor: Normal  Speech/Language:  Clear and Coherent  Affect: Appropriate  Mood: dysthymic  Thought process: normal  Thought content:   WNL  Sensory/Perceptual disturbances:   WNL  Orientation: oriented to person, place, time/date, and situation  Attention: Good  Concentration: Good  Memory: WNL  Fund of knowledge:  Good  Insight:   Good  Judgment:  Good  Impulse Control: Good   Risk Assessment: Danger to Self:  No, but reported a history of this in the past. He denied any intent or plan during that time and noted numerous reasons to not act on thoughts including his relationships with his wife and friends.   Self-injurious Behavior: No Danger to Others: No Duty to Warn:no Physical Aggression / Violence:No  Access to Firearms a concern: No  Gang Involvement:No   In case of a mental health emergency:  24 - confidential suicide hotline. [facility]):        [address], [place] [address]       [phone] 3.   911  4.   Visiting Nearest ED.    Subjective:   Randy Cordova. participated from home, via video and consented to treatment. Therapist participated from home office. He noted that ambulation has been more difficult due to knee pain. He noted recently being limited by this but noted being able be slightly more active this past week. He noted a chance to control my temper. He provided background regarding a recent interaction at the grocery store while using the automatic cart and feeling judgement from another patron. He noted managing his anger and not engaging with this person. 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 He noted that his wife stated that there is "no space" between being happy and being angry. He noted the frustration regarding his attempts to approach situations calmly but, at times, having to escalate his approach to get movement from others. He noted that this is quite effective. Ho noted his wife stating "imagine how I felt" when he reacted strongly to someone not moving their car to allow Jerrett to pass through to take her to a doctor appointment. He noted that this person "was doing wrong" and that "if he is doing it to me, he is doing it to other people too". He noted the larger societal issues in which people don't seem to care about others or address issues directly. We worked on processing this. Therapist encouraged Randy Cordova to review the distress tolerance email, previously sent, and apply the REST exercise to this experience and to identify additional ways to manage distress and communicate going forward to build cognitive flexibility. Arad was engaged and motivated during the session. He expressed commitment towards goals. A follow-up was scheduled for continued treatment, which she benefits from.   Interventions: CBT & interpersonal.   Diagnosis:  Generalized anxiety disorder  Psychiatric Treatment: No , NA.   Treatment Plan:  Client Abilities/Strengths Randy Cordova is intelligent, introspective, and motivated for change.   Support System: Family and friends.   Client Treatment Preferences  Outpatient Therapy.   Client Statement of Needs Randy Cordova would like to improve distress tolerance, manage frustrations more effectively, work on acceptance, identifying areas of control and lack of control, manage anxiety (ex. What ifs), thinking more positively, being more in the moment, manage negative self-talk, manage cognitive distortions, managing health related anxiety, reduce  rumination, work on and manage expectations, and being more cognitively flexible.    Treatment Level Weekly  Symptoms  Anxiety: feeling anxious, difficulty managing worry, worrying about different things, trouble relaxing, irritability,    (Status: maintained) Depression: loss of interest, feeling down, insomnia sleep (some anxiety, pain, ), lethargy,  infrequent overeater, feeling bad about self, psychomotor retardation.   (Status: maintained)  Goals:   Victorious experiences symptoms of Anxiety and depression.   Treatment plan signed and available on s-drive:  No, pending signature.  Randy Cordova. was sent the treatment plan signature form on Date: 07/03/23.   Target Date: 06/11/24 Frequency: Weekly  Progress: 0 Modality: individual    Therapist will provide referrals for additional resources as appropriate.  Therapist will provide psycho-education regarding Saud's diagnosis and corresponding treatment approaches and interventions. Belva Boyden, LCSW will support the patient's ability to achieve the goals identified. will employ CBT, BA, Problem-solving, Solution Focused, Mindfulness,  coping skills, & other evidenced-based practices will be used to promote progress towards healthy functioning to help manage decrease symptoms associated with his diagnosis.   Reduce overall level, frequency, and intensity of the feelings of depression, anxiety and panic evidenced by decreased overall symptoms from 6 to 7 days/week to 0 to 1 days/week per client report for at least 3 consecutive months. Verbally express understanding of the relationship between feelings of depression, anxiety and their impact on thinking patterns and behaviors. Verbalize an understanding of the role that distorted thinking plays in creating fears, excessive worry, and ruminations.    [name] participated in the creation of the treatment plan)   Belva Boyden, LCSW

## 2023-07-10 ENCOUNTER — Ambulatory Visit (INDEPENDENT_AMBULATORY_CARE_PROVIDER_SITE_OTHER): Admitting: Psychology

## 2023-07-10 DIAGNOSIS — F411 Generalized anxiety disorder: Secondary | ICD-10-CM

## 2023-07-10 NOTE — Progress Notes (Signed)
 [facility] Counselor/Therapist Progress Note  Patient ID: Randy Mcmanaway., MRN: [id number]   Date: 07/10/23  Time Spent: 10:02 am - 11:02 am : 60 Minutes  Treatment Type: Individual Therapy.  Reported Symptoms: Anxiety and depression.   Mental Status Exam: Appearance:  Casual     Behavior: Appropriate  Motor: Normal  Speech/Language:  Clear and Coherent  Affect: Appropriate  Mood: dysthymic  Thought process: normal  Thought content:   WNL  Sensory/Perceptual disturbances:   WNL  Orientation: oriented to person, place, time/date, and situation  Attention: Good  Concentration: Good  Memory: WNL  Fund of knowledge:  Good  Insight:   Good  Judgment:  Good  Impulse Control: Good   Risk Assessment: Danger to Self:  No, but reported a history of this in the past. He denied any intent or plan during that time and noted numerous reasons to not act on thoughts including his relationships with his wife and friends.   Self-injurious Behavior: No Danger to Others: No Duty to Warn:no Physical Aggression / Violence:No  Access to Firearms a concern: No  Gang Involvement:No   In case of a mental health emergency:  62 - confidential suicide hotline. [facility]):        [address], [place] [address]       [phone] 3.   911  4.   Visiting Nearest ED.    Subjective:   Randy Fix. participated from home, via video and consented to treatment. Therapist participated from home office. Randy Cordova noted having a seizure yesterday and noted this being very stressful. He noted that his seizures include muscle tightening and shaking. Randy Cordova stated that he didn't "know what's happening" then "woke up on the floor two hours later". After the seizures he noted often stuttering, not fully present, and feeling tired and sore. He noted that his body "has to recover". He note reviewing the handout covering REST exercise and  reviewed this during the session. He noted his wife stating that "you see nothing wrong with that" in reference to a past occurrence when Randy Cordova became agitated in public. Randy Cordova noted his wife often states that he sees everything as a "mission". 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 We worked on exploring what this interaction could look like if he did not approach it as a "mission". We worked on finding alternative. He noted that the person he yelled at was "wrong". He noted that his grandfather, from a young age, "beat into my head that you have to protect those who can't protect themselves". He noted that "bypassing him" affirms this person's "wrong" behavior. He noted that he could have bypassed him and made another round but that it "would have gnawed at me". We continued to process this during the session. Therapist validated Randy Cordova's feelings and experience, challenged distortions, and provided supportive therapy.    Interventions: CBT & interpersonal.   Diagnosis:  Generalized anxiety disorder  Psychiatric Treatment: No , NA.   Treatment Plan:  Client Abilities/Strengths Randy Cordova is intelligent, introspective, and motivated for change.   Support System: Family and friends.   Client Treatment Preferences Outpatient Therapy.   Client Statement of Needs Randy Cordova would like to improve distress tolerance, manage frustrations more effectively, work on acceptance, identifying areas of control and lack of control, manage anxiety (ex. What ifs), thinking more positively, being  more in the moment, manage negative self-talk, manage cognitive distortions, managing health related anxiety, reduce rumination, work on and manage expectations, and being more cognitively flexible.    Treatment Level Weekly  Symptoms  Anxiety: feeling anxious, difficulty managing worry, worrying about different things, trouble relaxing,  irritability,    (Status: maintained) Depression: loss of interest, feeling down, insomnia sleep (some anxiety, pain, ), lethargy,  infrequent overeater, feeling bad about self, psychomotor retardation.   (Status: maintained)  Goals:   Randy Cordova experiences symptoms of Anxiety and depression.   Treatment plan signed and available on s-drive:  No, pending signature.  Randy Fix. was sent the treatment plan signature form on Date: 07/10/23.   Target Date: 06/11/24 Frequency: Weekly  Progress: 0 Modality: individual    Therapist will provide referrals for additional resources as appropriate.  Therapist will provide psycho-education regarding Randy Cordova diagnosis and corresponding treatment approaches and interventions. Randy Boyden, LCSW will support the patient's ability to achieve the goals identified. will employ CBT, BA, Problem-solving, Solution Focused, Mindfulness,  coping skills, & other evidenced-based practices will be used to promote progress towards healthy functioning to help manage decrease symptoms associated with his diagnosis.   Reduce overall level, frequency, and intensity of the feelings of depression, anxiety and panic evidenced by decreased overall symptoms from 6 to 7 days/week to 0 to 1 days/week per client report for at least 3 consecutive months. Verbally express understanding of the relationship between feelings of depression, anxiety and their impact on thinking patterns and behaviors. Verbalize an understanding of the role that distorted thinking plays in creating fears, excessive worry, and ruminations.    [name] participated in the creation of the treatment plan)   Randy Boyden, LCSW

## 2023-07-18 ENCOUNTER — Ambulatory Visit (INDEPENDENT_AMBULATORY_CARE_PROVIDER_SITE_OTHER): Admitting: Psychology

## 2023-07-18 DIAGNOSIS — F411 Generalized anxiety disorder: Secondary | ICD-10-CM

## 2023-07-18 NOTE — Progress Notes (Signed)
 Randy Cordova  Patient ID: Randy Lukehart., Randy Cordova   Date: 07/18/23  Time Spent: 9:02 am - 10:01 am : 59 Minutes  Treatment Type: Individual Therapy.  Reported Symptoms: Anxiety and depression.   Mental Status Exam: Appearance:  Casual     Behavior: Appropriate  Motor: Normal  Speech/Language:  Clear and Coherent  Affect: Appropriate  Mood: dysthymic  Thought process: normal  Thought content:   WNL  Sensory/Perceptual disturbances:   WNL  Orientation: oriented to person, place, time/date, and situation  Attention: Good  Concentration: Good  Memory: WNL  Fund of knowledge:  Good  Insight:   Good  Judgment:  Good  Impulse Control: Good   Risk Assessment: Danger to Self:  No, but reported a history of this in the past. He denied any intent or plan during that time and noted numerous reasons to not act on thoughts including his relationships with his wife and friends.   Self-injurious Behavior: No Danger to Others: No Duty to Warn:no Physical Aggression / Violence:No  Access to Firearms a concern: No  Gang Involvement:No   In case of a mental health emergency:  60 - confidential suicide hotline. [facility]):        [address], [place] [address]       [phone] 3.   911  4.   Visiting Nearest ED.    Subjective:   Randy Fix. participated from home, via video and consented to treatment. Therapist participated from home office. Randy Cordova noted being notified immediately prior to this session that his sister in-law had just passed. He noted his SILs complicated health history including multiple cardiological events and cancer. He noted the effect of this loss on his wife. He provided history of family dynamics in his wife's family. He noted frustration regarding his niece in-law who did not support her mother, Lynn's sister, who just passed. Randy Cordova noted  here I am trying find someone to blame'. He noted that he gets really aggravated when people don't do the right thing and noted having to manage these feelings prior to calling his wife to provider support. He noted her various attempts to be supportive to his niece in-law in the past and noted this being unappreciated and, at times, rebuffed. He noted I have a moral code and I expect people to live by. We worked on explored this during the session. He noted this being driving by his childhood experience and the message from his grandfather about protecting others. He noted the effects of his anger on his mood and health. He noted his BP being ~125. We will work on managing his physiological symptoms during our follow-up and continue process his perspective including addressing rigid thinking in this area including should and shouldn'ts. Randy Cordova was engaged and motivated during the session. He expressed commitment towards goals and and managing his overall symptoms. Therapist validated Randy Cordova experience and provided supportive therapy. A follow-up was scheduled for continued treatment, which he benefits from.    Interventions: CBT & interpersonal.   Diagnosis:  Generalized anxiety disorder  Psychiatric Treatment: No , NA.   Treatment Plan:  Client Abilities/Strengths Randy Cordova is intelligent, introspective, and motivated for change.   Support System: Family and friends.   Client Treatment Preferences Outpatient Therapy.   Client Statement of Needs Randy Cordova would like to improve distress tolerance, manage frustrations more effectively, work on acceptance, identifying areas of control and lack of control,  manage anxiety (ex. What ifs), thinking more positively, being more in the moment, manage negative self-talk, manage cognitive distortions, managing health related anxiety, reduce rumination, work on and manage expectations, and being more cognitively flexible.    Treatment  Level Weekly  Symptoms  Anxiety: feeling anxious, difficulty managing worry, worrying about different things, trouble relaxing, irritability,    (Status: maintained) Depression: loss of interest, feeling down, insomnia sleep (some anxiety, pain, ), lethargy,  infrequent overeater, feeling bad about self, psychomotor retardation.   (Status: maintained)  Goals:   Randy Cordova experiences symptoms of Anxiety and depression.   Treatment plan signed and available on s-drive:  No, pending signature.  Randy Fix. was sent the treatment plan signature form on Date: 07/18/23.   Target Date: 06/11/24 Frequency: Weekly  Progress: 0 Modality: individual    Therapist will provide referrals for additional resources as appropriate.  Therapist will provide psycho-education regarding Randy Cordova's diagnosis and corresponding treatment approaches and interventions. Randy Boyden, LCSW will support the patient's ability to achieve the goals identified. will employ CBT, BA, Problem-solving, Solution Focused, Mindfulness,  coping skills, & other evidenced-based practices will be used to promote progress towards healthy functioning to help manage decrease symptoms associated with his diagnosis.   Reduce overall level, frequency, and intensity of the feelings of depression, anxiety and panic evidenced by decreased overall symptoms from 6 to 7 days/week to 0 to 1 days/week per client report for at least 3 consecutive months. Verbally express understanding of the relationship between feelings of depression, anxiety and their impact on thinking patterns and behaviors. Verbalize an understanding of the role that distorted thinking plays in creating fears, excessive worry, and ruminations.    [name] participated in the creation of the treatment plan)   Randy Boyden, LCSW

## 2023-07-25 ENCOUNTER — Ambulatory Visit: Admitting: Psychology

## 2023-07-25 DIAGNOSIS — F411 Generalized anxiety disorder: Secondary | ICD-10-CM | POA: Diagnosis not present

## 2023-07-25 NOTE — Progress Notes (Signed)
 [facility] Counselor/Therapist Progress Note  Patient ID: Jayson Waterhouse., MRN: [id number]   Date: 07/25/23  Time Spent: 1:05 pm - 2:01 pm : 56 Minutes  Treatment Type: Individual Therapy.  Reported Symptoms: Anxiety and depression.   Mental Status Exam: Appearance:  Casual     Behavior: Appropriate  Motor: Normal  Speech/Language:  Clear and Coherent  Affect: Appropriate  Mood: dysthymic  Thought process: normal  Thought content:   WNL  Sensory/Perceptual disturbances:   WNL  Orientation: oriented to person, place, time/date, and situation  Attention: Good  Concentration: Good  Memory: WNL  Fund of knowledge:  Good  Insight:   Good  Judgment:  Good  Impulse Control: Good   Risk Assessment: Danger to Self:  No, but reported a history of this in the past. He denied any intent or plan during that time and noted numerous reasons to not act on thoughts including his relationships with his wife and friends.   Self-injurious Behavior: No Danger to Others: No Duty to Warn:no Physical Aggression / Violence:No  Access to Firearms a concern: No  Gang Involvement:No   In case of a mental health emergency:  42 - confidential suicide hotline. [facility]):        [address], [place] [address]       [phone] 3.   911  4.   Visiting Nearest ED.    Subjective:   [name] participated from home, via video and consented to treatment. Therapist participated from home office. Sheriff noted the events of the past week. He noted recently having a severe seizure and attributed this due experiencing 'pure unmitigated rage. We worked on processing his feelings. He noted, at times, not being able to back out of the anger. 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  He noted contributing factors include the news, friends reaching out, the illegality of this behavior, misuse of the marines, worry for citizens, frustration regarding the current political climate and administration, that the behavior is not right, and this is not what I defended. We worked on processing this during the session. He noted having difficulty managing his frustration during this time. He did noted having to set boundaries for others to no share similar news going forward. We identified additional ways to manage his frustration including identifying areas of control and lack of control, using distraction (audio books), listening to music, distancing self from frustration. Therapist modeled this during the session. He noted triggers for seizures including poor sleep, high levels of frustration, difficulty walking away from the stressor, asking unanswerable questions, role in his family of being supportive. We worked on identifying boundaries for self going forward. Avis inquired about a DBT workbook and therapist provided a link to this via email. Therapist validated Emidio's feelings and experience, encouraged boundaries for self, and provided supportive therapy. A follow-up was scheduled for continued treatment, which he continues to benefit from.   Interventions: CBT & interpersonal.   Diagnosis:  Generalized anxiety disorder  Psychiatric Treatment: No , NA.   Treatment Plan:  Client Abilities/Strengths Earon is intelligent, introspective, and motivated for change.   Support System: Family and friends.   Client Treatment Preferences Outpatient Therapy.   Client Statement of Needs Rieley would like to improve distress tolerance, manage frustrations  more effectively, work on acceptance, identifying areas of control and lack of control, manage anxiety (ex. What ifs), thinking more positively, being more in the moment, manage negative self-talk, manage cognitive distortions,  managing health related anxiety, reduce rumination, work on and manage expectations, and being more cognitively flexible.    Treatment Level Weekly  Symptoms  Anxiety: feeling anxious, difficulty managing worry, worrying about different things, trouble relaxing, irritability,    (Status: maintained) Depression: loss of interest, feeling down, insomnia sleep (some anxiety, pain, ), lethargy,  infrequent overeater, feeling bad about self, psychomotor retardation.   (Status: maintained)  Goals:   Ivie experiences symptoms of Anxiety and depression.   Treatment plan signed and available on s-drive:  No, pending signature.  [name] was sent the treatment plan signature form on Date: 07/25/23.   Target Date: 06/11/24 Frequency: Weekly  Progress: 0 Modality: individual    Therapist will provide referrals for additional resources as appropriate.  Therapist will provide psycho-education regarding Sandor's diagnosis and corresponding treatment approaches and interventions. Belva Boyden, LCSW will support the patient's ability to achieve the goals identified. will employ CBT, BA, Problem-solving, Solution Focused, Mindfulness,  coping skills, & other evidenced-based practices will be used to promote progress towards healthy functioning to help manage decrease symptoms associated with his diagnosis.   Reduce overall level, frequency, and intensity of the feelings of depression, anxiety and panic evidenced by decreased overall symptoms from 6 to 7 days/week to 0 to 1 days/week per client report for at least 3 consecutive months. Verbally express understanding of the relationship between feelings of depression, anxiety and their impact on thinking patterns and behaviors. Verbalize an understanding of the role that distorted thinking plays in creating fears, excessive worry, and ruminations.    [name] participated in the creation of the treatment plan)   Belva Boyden, LCSW

## 2023-08-04 ENCOUNTER — Ambulatory Visit (INDEPENDENT_AMBULATORY_CARE_PROVIDER_SITE_OTHER): Admitting: Psychology

## 2023-08-04 DIAGNOSIS — F411 Generalized anxiety disorder: Secondary | ICD-10-CM
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
## 2023-08-11 ENCOUNTER — Ambulatory Visit: Admitting: Psychology

## 2023-08-26 ENCOUNTER — Ambulatory Visit (INDEPENDENT_AMBULATORY_CARE_PROVIDER_SITE_OTHER): Admitting: Psychology

## 2023-08-26 DIAGNOSIS — F411 Generalized anxiety disorder: Secondary | ICD-10-CM
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
## 2023-09-05 ENCOUNTER — Ambulatory Visit: Admitting: Psychology

## 2023-09-05 DIAGNOSIS — F411 Generalized anxiety disorder: Secondary | ICD-10-CM | POA: Diagnosis not present
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
## 2023-09-12 ENCOUNTER — Ambulatory Visit: Admitting: Psychology

## 2023-09-12 DIAGNOSIS — F411 Generalized anxiety disorder: Secondary | ICD-10-CM | POA: Diagnosis not present
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
## 2023-09-19 ENCOUNTER — Ambulatory Visit (INDEPENDENT_AMBULATORY_CARE_PROVIDER_SITE_OTHER): Admitting: Psychology

## 2023-09-19 DIAGNOSIS — F411 Generalized anxiety disorder: Secondary | ICD-10-CM

## 2023-09-19 NOTE — Progress Notes (Signed)
 [facility] Counselor/Therapist Progress Note  Patient ID: Randy Cordova., MRN: [id number]   Date: 09/19/23  Time Spent: 11:05 am -12:01 pm : 56 Minutes  Treatment Type: Individual Therapy.  Reported Symptoms: Anxiety and depression.   Mental Status Exam: Appearance:  Casual     Behavior: Appropriate  Motor: Normal  Speech/Language:  Clear and Coherent  Affect: Appropriate  Mood: dysthymic  Thought process: normal  Thought content:   WNL  Sensory/Perceptual disturbances:   WNL  Orientation: oriented to person, place, time/date, and situation  Attention: Good  Concentration: Good  Memory: WNL  Fund of knowledge:  Good  Insight:   Good  Judgment:  Good  Impulse Control: Good   Risk Assessment: Danger to Self:  No, but reported a history of this in the past. He denied any intent or plan during that time and noted numerous reasons to not act on thoughts including his relationships with his wife and friends.   Self-injurious Behavior: No Danger to Others: No Duty to Warn:no Physical Aggression / Violence:No  Access to Firearms a concern: No  Gang Involvement:No   In case of a mental health emergency:  41 - confidential suicide hotline. [facility]):        [address], [place] [address]       [phone] 3.   911  4.   Visiting Nearest ED.    Subjective:   Randy Cordova. participated from home, via video and consented to treatment. Therapist participated from home office. Jamir noted experiencing a seizure earlier this week. Randy Cordova noted recently falling and having a concussion. He noted noted feeling better today since his concussion two days ago. He noted how this has affected his mood and noted a significant history of concussions. 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 Leor noted  often giving unsolicited feedback to strangers, when there is someone in need and no one acts to help them. He noted the drive for this is to possible correct the person's behavior in the future. He noted his wife noting you're not the police of the world in regarding to wanting to give feedback to others. He noted often often having to teach people a lesson. He noted I will never be able to stop acting on it. He noted that if he let it go that it would eat him up. He noted attempting this once and not how difficult this was in the past and noted still thinking about this experience. He noted his need to drive his point home. He noted his wife stated that he is quicker to get upset about these situations and that it appears to be difficult to let things as quickly. Therapist highlighted Odie's framing, language, and possible hypervigilance. He noted, in the [employer], being a damn good scout. We discussed how this might color his perspective in this area. We will work on exploring this going forward. Therapist praised Randy Cordova for his effort, energy, and vulnerability during the session. He noted lynn's acceptance of other people's feedback towards others but not Festus.   We will work on processing this going forward, as well. Therapist provided supportive therapy. A Follow-up was scheduled for continued treatment which he benefits from.   Interventions: CBT & interpersonal.   Diagnosis:  Generalized anxiety disorder  Psychiatric Treatment: No , NA.   Treatment Plan:  Client Abilities/Strengths Randy  is intelligent, introspective, and motivated for change.   Support System: Family and friends.   Client Treatment Preferences Outpatient Therapy.   Client Statement of Needs Randy Cordova would like to improve distress tolerance, manage frustrations more effectively, work on acceptance, identifying areas of control and lack of control, manage anxiety (ex. What ifs), thinking more positively,  being more in the moment, manage negative self-talk, manage cognitive distortions, managing health related anxiety, reduce rumination, work on and manage expectations, and being more cognitively flexible.    Treatment Level Weekly  Symptoms  Anxiety: feeling anxious, difficulty managing worry, worrying about different things, trouble relaxing, irritability,    (Status: maintained) Depression: loss of interest, feeling down, insomnia sleep (some anxiety, pain, ), lethargy,  infrequent overeater, feeling bad about self, psychomotor retardation.   (Status: maintained)  Goals:   [name] experiences symptoms of Anxiety and depression.   Treatment plan signed and available on s-drive:  No, pending signature.  Randy Cordova. was sent the treatment plan signature form on Date: 09/19/23.   Target Date: 06/11/24 Frequency: Weekly  Progress: 0 Modality: individual    Therapist will provide referrals for additional resources as appropriate.  Therapist will provide psycho-education regarding Bright's diagnosis and corresponding treatment approaches and interventions. [name], LCSW will support the patient's ability to achieve the goals identified. will employ CBT, BA, Problem-solving, Solution Focused, Mindfulness,  coping skills, & other evidenced-based practices will be used to promote progress towards healthy functioning to help manage decrease symptoms associated with his diagnosis.   Reduce overall level, frequency, and intensity of the feelings of depression, anxiety and panic evidenced by decreased overall symptoms from 6 to 7 days/week to 0 to 1 days/week per client report for at least 3 consecutive months. Verbally express understanding of the relationship between feelings of depression, anxiety and their impact on thinking patterns and behaviors. Verbalize an understanding of the role that distorted thinking plays in creating fears, excessive worry, and ruminations.    [name]  participated in the creation of the treatment plan)   [name], LCSW

## 2023-09-22 ENCOUNTER — Encounter: Payer: Self-pay | Admitting: Nurse Practitioner

## 2023-09-25 ENCOUNTER — Other Ambulatory Visit: Payer: Self-pay | Admitting: Nurse Practitioner

## 2023-09-25 DIAGNOSIS — M170 Bilateral primary osteoarthritis of knee: Secondary | ICD-10-CM

## 2023-09-29 ENCOUNTER — Telehealth: Payer: Self-pay

## 2023-09-29 ENCOUNTER — Ambulatory Visit (INDEPENDENT_AMBULATORY_CARE_PROVIDER_SITE_OTHER): Admitting: Psychology

## 2023-09-29 DIAGNOSIS — F411 Generalized anxiety disorder: Secondary | ICD-10-CM

## 2023-09-29 NOTE — Progress Notes (Signed)
 [facility] Counselor/Therapist Progress Note  Patient ID: Randy Cordova., MRN: [id number]   Date: 09/29/23  Time Spent: 1:33 pm - 2:31 pm : 58 Minutes  Treatment Type: Individual Therapy.  Reported Symptoms: Anxiety and depression.   Mental Status Exam: Appearance:  Casual     Behavior: Appropriate  Motor: Normal  Speech/Language:  Clear and Coherent  Affect: Appropriate  Mood: dysthymic  Thought process: normal  Thought content:   WNL  Sensory/Perceptual disturbances:   WNL  Orientation: oriented to person, place, time/date, and situation  Attention: Good  Concentration: Good  Memory: WNL  Fund of knowledge:  Good  Insight:   Good  Judgment:  Good  Impulse Control: Good   Risk Assessment: Danger to Self:  No, but reported a history of this in the past. He denied any intent or plan during that time and noted numerous reasons to not act on thoughts including his relationships with his wife and friends.   Self-injurious Behavior: No Danger to Others: No Duty to Warn:no Physical Aggression / Violence:No  Access to Firearms a concern: No  Gang Involvement:No   In case of a mental health emergency:  35 - confidential suicide hotline. [facility]):        [address], [place] [address]       [phone] 3.   911  4.   Visiting Nearest ED.    Subjective:   Randy Cordova. participated from home, via video and consented to treatment. Therapist participated from home office. He noted recently receiving medical clearance to travel to [place], for the next 6 months, to visit with his wife. He noted a recent discussion with his wife about his personality and how he is two different people. 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 We worked on exploring this during the session. He noted that his goals What is the right things to do and What can I do to  inflict the biggest impact with the least amount of force.  He noted that applying the REST tool that his plan was required. We worked on identifying additional viable options that can increase in assertiveness and directness during these moments. We discussed the benefits of delineating options and reflecting on the viability of the options in relation to his treatment goals of managing frustration, maintaining mood during stressful experiences, and reducing the likelihood that his stress affects his health. Lyonel noted his intent to work on developing this approach during the session. Therapist validated Nikos's feelings and experience during the session. Therapist validated Limuel's feelings and experience during the session. Therapist provided supportive therapy. A Follow-up was scheduled for continued treatment which Sukhman benefits from.   Interventions: CBT   Diagnosis:  Generalized anxiety disorder  Psychiatric Treatment: No , NA.   Treatment Plan:  Client Abilities/Strengths Quamaine is intelligent, introspective, and motivated for change.   Support System: Family and friends.   Client Treatment Preferences Outpatient Therapy.   Client Statement of Needs Jeron would like to improve distress tolerance, manage frustrations more effectively, work on acceptance, identifying areas of control and lack of control, manage anxiety (ex. What ifs), thinking more positively, being more in the moment, manage negative self-talk, manage cognitive distortions, managing health related anxiety, reduce rumination, work on and manage expectations, and being more cognitively flexible.    Treatment Level Weekly  Symptoms  Anxiety: feeling anxious, difficulty managing worry, worrying about different  things, trouble relaxing, irritability,    (Status: maintained) Depression: loss of interest, feeling down, insomnia sleep (some anxiety, pain, ), lethargy,  infrequent overeater, feeling bad about self,  psychomotor retardation.   (Status: maintained)  Goals:   Kegan experiences symptoms of Anxiety and depression.   Treatment plan signed and available on s-drive:  No, pending signature.  Randy Cordova. was sent the treatment plan signature form on Date: 09/29/23.   Target Date: 06/11/24 Frequency: Weekly  Progress: 0 Modality: individual    Therapist will provide referrals for additional resources as appropriate.  Therapist will provide psycho-education regarding Vang's diagnosis and corresponding treatment approaches and interventions. [name], LCSW will support the patient's ability to achieve the goals identified. will employ CBT, BA, Problem-solving, Solution Focused, Mindfulness,  coping skills, & other evidenced-based practices will be used to promote progress towards healthy functioning to help manage decrease symptoms associated with his diagnosis.   Reduce overall level, frequency, and intensity of the feelings of depression, anxiety and panic evidenced by decreased overall symptoms from 6 to 7 days/week to 0 to 1 days/week per client report for at least 3 consecutive months. Verbally express understanding of the relationship between feelings of depression, anxiety and their impact on thinking patterns and behaviors. Verbalize an understanding of the role that distorted thinking plays in creating fears, excessive worry, and ruminations.    [name] participated in the creation of the treatment plan)   [name], LCSW

## 2023-09-29 NOTE — Telephone Encounter (Unsigned)
 Copied from CRM #[id number]. Topic: General - Other >> Sep 26, 2023  9:02 AM Rosina BIRCH wrote: Reason for CRM: slyvia from [employer] called stating the provider can view the health risk assessment and care plan for the patient under the [employer] website where they do billing under

## 2023-10-07 ENCOUNTER — Ambulatory Visit (INDEPENDENT_AMBULATORY_CARE_PROVIDER_SITE_OTHER): Admitting: Psychology

## 2023-10-07 DIAGNOSIS — F411 Generalized anxiety disorder: Secondary | ICD-10-CM

## 2023-10-07 NOTE — Progress Notes (Signed)
 [facility] Counselor/Therapist Progress Note  Patient ID: Kavi Almquist., MRN: [id number]   Date: 10/07/23  Time Spent: 10:05 am - 11:01 am : 56 Minutes  Treatment Type: Individual Therapy.  Reported Symptoms: Anxiety and depression.   Mental Status Exam: Appearance:  Casual     Behavior: Appropriate  Motor: Normal  Speech/Language:  Clear and Coherent  Affect: Appropriate  Mood: normal  Thought process: normal  Thought content:   WNL  Sensory/Perceptual disturbances:   WNL  Orientation: oriented to person, place, time/date, and situation  Attention: Good  Concentration: Good  Memory: WNL  Fund of knowledge:  Good  Insight:   Good  Judgment:  Good  Impulse Control: Good   Risk Assessment: Danger to Self:  No, but reported a history of this in the past. He denied any intent or plan during that time and noted numerous reasons to not act on thoughts including his relationships with his wife and friends.   Self-injurious Behavior: No Danger to Others: No Duty to Warn:no Physical Aggression / Violence:No  Access to Firearms a concern: No  Gang Involvement:No   In case of a mental health emergency:  30 - confidential suicide hotline. [facility]):        [address], [place] [address]       [phone] 3.   911  4.   Visiting Nearest ED.    Subjective:   [name] participated from home, via video and consented to treatment. Therapist participated from office. Kunal noted recent frustrations regarding flight plans. He noted his effort to communicate needs, maintain composure, and advocate for self. He noted having to be on the phone for ~8.5 hours, in total, to resolve this issue. He noted not yelling or cursing over the phone. He noted I didn't even get angry. He noted having to managing his frustration during the call. He noted his wife acknowledging his efforts during the call. We worked  on identifying ways to carry over this approach going forwards with other points of frustrations. He noted writing a letter expressing his feelings, as well, and noted being able to advocate for himself there, as well. Jaecob noted taking in consideration the possible effect of his approach on others and how this shifted his choices throughout the conversation. Therapist praised Jesselee for his effort to apply the tools learned in therapy. Therapist encouraged Sulo to continue with his work in this area and to make decisions that align his his overall treatment goals. Yoshimi was engaged and motivated during the session. He expressed commitment towards goals. Therapist praised Zamire for his effort and energy during the session and provided supportive therapy. A follow-up was scheduled for continued treatment which Cristian benefits from.   Interventions: CBT   Diagnosis:  Generalized anxiety disorder  Psychiatric Treatment: No , NA.   Treatment Plan:  Client Abilities/Strengths Rafel is intelligent, introspective, and motivated for change.   Support System: Family and friends.   Client Treatment Preferences Outpatient Therapy.   Client Statement of Needs Antawan would like to improve distress tolerance, manage frustrations more effectively, work on acceptance, identifying areas of control and lack of control, manage anxiety (ex. What ifs), thinking more positively, being more in the moment, manage negative self-talk, manage cognitive distortions, managing health related anxiety, reduce rumination, work on and manage expectations, and being more cognitively flexible.    Treatment Level Weekly  Symptoms  Anxiety: feeling anxious, difficulty managing worry,  worrying about different things, trouble relaxing, irritability,    (Status: maintained) Depression: loss of interest, feeling down, insomnia sleep (some anxiety, pain, ), lethargy,  infrequent overeater, feeling bad about self, psychomotor  retardation.   (Status: maintained)  Goals:   Daytona experiences symptoms of Anxiety and depression.   Treatment plan signed and available on s-drive:  No, pending signature.  [name] was sent the treatment plan signature form on Date: 10/07/23.   Target Date: 06/11/24 Frequency: Weekly  Progress: 0 Modality: individual    Therapist will provide referrals for additional resources as appropriate.  Therapist will provide psycho-education regarding Ashaad's diagnosis and corresponding treatment approaches and interventions. [name], LCSW will support the patient's ability to achieve the goals identified. will employ CBT, BA, Problem-solving, Solution Focused, Mindfulness,  coping skills, & other evidenced-based practices will be used to promote progress towards healthy functioning to help manage decrease symptoms associated with his diagnosis.   Reduce overall level, frequency, and intensity of the feelings of depression, anxiety and panic evidenced by decreased overall symptoms from 6 to 7 days/week to 0 to 1 days/week per client report for at least 3 consecutive months. Verbally express understanding of the relationship between feelings of depression, anxiety and their impact on thinking patterns and behaviors. Verbalize an understanding of the role that distorted thinking plays in creating fears, excessive worry, and ruminations.    [name] participated in the creation of the treatment plan)   [name], LCSW

## 2023-10-14 ENCOUNTER — Ambulatory Visit (INDEPENDENT_AMBULATORY_CARE_PROVIDER_SITE_OTHER): Admitting: Psychology

## 2023-10-14 DIAGNOSIS — F411 Generalized anxiety disorder: Secondary | ICD-10-CM

## 2023-10-14 NOTE — Progress Notes (Unsigned)
 [facility] Counselor/Therapist Progress Note  Patient ID: Randy Metoyer., MRN: [id number]   Date: 10/14/23  Time Spent: 11:07 am - 12:00 pm : 53 Minutes  Treatment Type: Individual Therapy.  Reported Symptoms: Anxiety and depression.   Mental Status Exam: Appearance:  Casual     Behavior: Appropriate  Motor: Normal  Speech/Language:  Clear and Coherent  Affect: Appropriate  Mood: normal  Thought process: normal  Thought content:   WNL  Sensory/Perceptual disturbances:   WNL  Orientation: oriented to person, place, time/date, and situation  Attention: Good  Concentration: Good  Memory: WNL  Fund of knowledge:  Good  Insight:   Good  Judgment:  Good  Impulse Control: Good   Risk Assessment: Danger to Self:  No, but reported a history of this in the past. He denied any intent or plan during that time and noted numerous reasons to not act on thoughts including his relationships with his wife and friends.   Self-injurious Behavior: No Danger to Others: No Duty to Warn:no Physical Aggression / Violence:No  Access to Firearms a concern: No  Gang Involvement:No   In case of a mental health emergency:  19 - confidential suicide hotline. [facility]):        [address], [place] [address]       [phone] 3.   911  4.   Visiting Nearest ED.    Subjective:   Randy Trudy Raddle. participated from home, via video and consented to treatment. Therapist participated from office. Randy Cordova noted his slated to travel to the [place], tomorrow, and will return in 6 months.We worked on reviewing coping skills during the session. He noted his continued effort to use the REST intervention going forward and Randy Cordova noted that he will often do the opposite of his initial impulse, which is does not align with his long-term goals. We worked on processing this during the session. We worked on identifying ways to be more mindful and  purposeful with his decision-making. Therapist modeled this during the session. We worked differentiating between various stressors and frustrations to aid in decision-making with the REST protocol. We worked on reviewing coping skills he can employ during times of stress including taking breaks and using calming techniques. Randy Cordova noted his drive to continue working on managing his stress which he has linked directly to his health, specifically experiencing more frequent and severe seizures. Randy Cordova noted his intent to contact therapist, upon his return from the [place] to schedule a follow-up session. Therapist praised Randy Cordova for his effort during the session including reading a DBT book that was previously recommended. Therapist validated Randy Cordova's feelings and experience and provided supportive therapy.   Interventions: CBT   Diagnosis:  Generalized anxiety disorder  Psychiatric Treatment: No , NA.   Treatment Plan:  Client Abilities/Strengths Randy Cordova is intelligent, introspective, and motivated for change.   Support System: Family and friends.   Client Treatment Preferences Outpatient Therapy.   Client Statement of Needs Randy Cordova would like to improve distress tolerance, manage frustrations more effectively, work on acceptance, identifying areas of control and lack of control, manage anxiety (ex. What ifs), thinking more positively, being more in the moment, manage negative self-talk, manage cognitive distortions, managing health related anxiety, reduce rumination, work on and manage expectations, and being more cognitively flexible.    Treatment Level Weekly  Symptoms  Anxiety: feeling anxious, difficulty managing worry, worrying about different things, trouble relaxing, irritability,    (Status: maintained)  Depression: loss of interest, feeling down, insomnia sleep (some anxiety, pain, ), lethargy,  infrequent overeater, feeling bad about self, psychomotor retardation.   (Status:  maintained)  Goals:   Randy Cordova experiences symptoms of Anxiety and depression.   Treatment plan signed and available on s-drive:  No, pending signature.  Randy Trudy Raddle. was sent the treatment plan signature form on Date: 10/14/23.   Target Date: 06/11/24 Frequency: Weekly  Progress: 0 Modality: individual    Therapist will provide referrals for additional resources as appropriate.  Therapist will provide psycho-education regarding Fahad's diagnosis and corresponding treatment approaches and interventions. [name], LCSW will support the patient's ability to achieve the goals identified. will employ CBT, BA, Problem-solving, Solution Focused, Mindfulness,  coping skills, & other evidenced-based practices will be used to promote progress towards healthy functioning to help manage decrease symptoms associated with his diagnosis.   Reduce overall level, frequency, and intensity of the feelings of depression, anxiety and panic evidenced by decreased overall symptoms from 6 to 7 days/week to 0 to 1 days/week per client report for at least 3 consecutive months. Verbally express understanding of the relationship between feelings of depression, anxiety and their impact on thinking patterns and behaviors. Verbalize an understanding of the role that distorted thinking plays in creating fears, excessive worry, and ruminations.    Randy Cordova participated in the creation of the treatment plan)   [name], LCSW

## 2023-10-28 ENCOUNTER — Ambulatory Visit: Admitting: Nurse Practitioner

## 2024-05-26 ENCOUNTER — Ambulatory Visit
